# Patient Record
Sex: Male | Born: 1960 | Race: Black or African American | Hispanic: No | Marital: Married | Smoking: Never smoker
Health system: Southern US, Community
[De-identification: ages and names within clinical notes are randomized; demographics above are authoritative.]

## PROBLEM LIST (undated history)

## (undated) DIAGNOSIS — G4700 Insomnia, unspecified: Secondary | ICD-10-CM

## (undated) DIAGNOSIS — E7800 Pure hypercholesterolemia, unspecified: Secondary | ICD-10-CM

## (undated) DIAGNOSIS — M109 Gout, unspecified: Secondary | ICD-10-CM

## (undated) DIAGNOSIS — M1990 Unspecified osteoarthritis, unspecified site: Secondary | ICD-10-CM

## (undated) DIAGNOSIS — R7303 Prediabetes: Secondary | ICD-10-CM

## (undated) DIAGNOSIS — E559 Vitamin D deficiency, unspecified: Secondary | ICD-10-CM

## (undated) DIAGNOSIS — I10 Essential (primary) hypertension: Secondary | ICD-10-CM

## (undated) DIAGNOSIS — N529 Male erectile dysfunction, unspecified: Secondary | ICD-10-CM

## (undated) HISTORY — PX: COLONOSCOPY WITH ESOPHAGOGASTRODUODENOSCOPY (EGD): SHX5779

## (undated) HISTORY — PX: TONSILLECTOMY: SUR1361

## (undated) HISTORY — DX: Male erectile dysfunction, unspecified: N52.9

## (undated) HISTORY — DX: Prediabetes: R73.03

## (undated) HISTORY — DX: Insomnia, unspecified: G47.00

## (undated) HISTORY — DX: Vitamin D deficiency, unspecified: E55.9

## (undated) HISTORY — DX: Gout, unspecified: M10.9

---

## 2012-04-01 LAB — HM COLONOSCOPY

## 2014-09-25 ENCOUNTER — Emergency Department (HOSPITAL_COMMUNITY): Payer: Managed Care, Other (non HMO)

## 2014-09-25 ENCOUNTER — Observation Stay (HOSPITAL_COMMUNITY)
Admission: EM | Admit: 2014-09-25 | Discharge: 2014-09-26 | Disposition: A | Discharge status: Home / Self Care | Payer: Managed Care, Other (non HMO) | Attending: Urology | Admitting: Urology

## 2014-09-25 ENCOUNTER — Emergency Department (HOSPITAL_COMMUNITY): Payer: Managed Care, Other (non HMO) | Admitting: Anesthesiology

## 2014-09-25 ENCOUNTER — Encounter (HOSPITAL_COMMUNITY)
Admission: EM | Disposition: A | Discharge status: Home / Self Care | Payer: Self-pay | Source: Home / Self Care | Attending: Emergency Medicine

## 2014-09-25 ENCOUNTER — Encounter (HOSPITAL_COMMUNITY): Payer: Self-pay | Admitting: Nurse Practitioner

## 2014-09-25 DIAGNOSIS — N453 Epididymo-orchitis: Secondary | ICD-10-CM | POA: Diagnosis not present

## 2014-09-25 DIAGNOSIS — N433 Hydrocele, unspecified: Secondary | ICD-10-CM | POA: Insufficient documentation

## 2014-09-25 DIAGNOSIS — N492 Inflammatory disorders of scrotum: Secondary | ICD-10-CM | POA: Diagnosis present

## 2014-09-25 DIAGNOSIS — E780 Pure hypercholesterolemia: Secondary | ICD-10-CM | POA: Diagnosis not present

## 2014-09-25 DIAGNOSIS — I10 Essential (primary) hypertension: Secondary | ICD-10-CM | POA: Diagnosis not present

## 2014-09-25 DIAGNOSIS — Z880 Allergy status to penicillin: Secondary | ICD-10-CM | POA: Diagnosis not present

## 2014-09-25 DIAGNOSIS — N508 Other specified disorders of male genital organs: Secondary | ICD-10-CM | POA: Insufficient documentation

## 2014-09-25 DIAGNOSIS — R609 Edema, unspecified: Secondary | ICD-10-CM

## 2014-09-25 HISTORY — PX: IRRIGATION AND DEBRIDEMENT ABSCESS: SHX5252

## 2014-09-25 HISTORY — DX: Essential (primary) hypertension: I10

## 2014-09-25 HISTORY — PX: SCROTAL EXPLORATION: SHX2386

## 2014-09-25 HISTORY — DX: Pure hypercholesterolemia, unspecified: E78.00

## 2014-09-25 LAB — COMPREHENSIVE METABOLIC PANEL
ALBUMIN: 3.5 g/dL (ref 3.5–5.2)
ALK PHOS: 69 U/L (ref 39–117)
ALT: 19 U/L (ref 0–53)
AST: 23 U/L (ref 0–37)
Anion gap: 10 (ref 5–15)
BUN: 15 mg/dL (ref 6–23)
CALCIUM: 9.1 mg/dL (ref 8.4–10.5)
CHLORIDE: 104 mmol/L (ref 96–112)
CO2: 25 mmol/L (ref 19–32)
Creatinine, Ser: 1.51 mg/dL — ABNORMAL HIGH (ref 0.50–1.35)
GFR calc non Af Amer: 51 mL/min — ABNORMAL LOW (ref >90)
GFR, EST AFRICAN AMERICAN: 59 mL/min — AB (ref >90)
GLUCOSE: 130 mg/dL — AB (ref 70–99)
POTASSIUM: 4.1 mmol/L (ref 3.5–5.1)
SODIUM: 139 mmol/L (ref 135–145)
Total Bilirubin: 0.5 mg/dL (ref 0.3–1.2)
Total Protein: 7 g/dL (ref 6.0–8.3)

## 2014-09-25 LAB — URINALYSIS, ROUTINE W REFLEX MICROSCOPIC
Bilirubin Urine: NEGATIVE
Glucose, UA: NEGATIVE mg/dL
Hgb urine dipstick: NEGATIVE
Ketones, ur: NEGATIVE mg/dL
Leukocytes, UA: NEGATIVE
NITRITE: NEGATIVE
Protein, ur: NEGATIVE mg/dL
SPECIFIC GRAVITY, URINE: 1.025 (ref 1.005–1.030)
Urobilinogen, UA: 0.2 mg/dL (ref 0.0–1.0)
pH: 5.5 (ref 5.0–8.0)

## 2014-09-25 LAB — GRAM STAIN

## 2014-09-25 LAB — CBC WITH DIFFERENTIAL/PLATELET
BASOS ABS: 0 10*3/uL (ref 0.0–0.1)
BASOS PCT: 0 % (ref 0–1)
Eosinophils Absolute: 0 10*3/uL (ref 0.0–0.7)
Eosinophils Relative: 1 % (ref 0–5)
HCT: 43 % (ref 39.0–52.0)
HEMOGLOBIN: 14.7 g/dL (ref 13.0–17.0)
Lymphocytes Relative: 25 % (ref 12–46)
Lymphs Abs: 1.9 10*3/uL (ref 0.7–4.0)
MCH: 28.6 pg (ref 26.0–34.0)
MCHC: 34.2 g/dL (ref 30.0–36.0)
MCV: 83.7 fL (ref 78.0–100.0)
Monocytes Absolute: 0.9 10*3/uL (ref 0.1–1.0)
Monocytes Relative: 11 % (ref 3–12)
NEUTROS ABS: 4.7 10*3/uL (ref 1.7–7.7)
NEUTROS PCT: 63 % (ref 43–77)
Platelets: 270 10*3/uL (ref 150–400)
RBC: 5.14 MIL/uL (ref 4.22–5.81)
RDW: 13.8 % (ref 11.5–15.5)
WBC: 7.5 10*3/uL (ref 4.0–10.5)

## 2014-09-25 SURGERY — EXPLORATION, SCROTUM
Anesthesia: General | Site: Scrotum

## 2014-09-25 MED ORDER — BUPIVACAINE HCL (PF) 0.25 % IJ SOLN
INTRAMUSCULAR | Status: AC
  Filled 2014-09-25: qty 30

## 2014-09-25 MED ORDER — FENTANYL CITRATE 0.05 MG/ML IJ SOLN
INTRAMUSCULAR | Status: AC
  Filled 2014-09-25: qty 5

## 2014-09-25 MED ORDER — PROMETHAZINE HCL 25 MG/ML IJ SOLN: 6.2500 mg | INTRAMUSCULAR | Status: DC | PRN

## 2014-09-25 MED ORDER — HYDROMORPHONE HCL 1 MG/ML IJ SOLN
INTRAMUSCULAR | Status: AC
  Filled 2014-09-25: qty 1

## 2014-09-25 MED ORDER — FENTANYL CITRATE 0.05 MG/ML IJ SOLN
INTRAMUSCULAR | Status: DC | PRN
  Administered 2014-09-25: 75 ug via INTRAVENOUS
  Administered 2014-09-25: 50 ug via INTRAVENOUS
  Administered 2014-09-25: 75 ug via INTRAVENOUS
  Administered 2014-09-25: 50 ug via INTRAVENOUS

## 2014-09-25 MED ORDER — BUPIVACAINE HCL (PF) 0.25 % IJ SOLN
INTRAMUSCULAR | Status: DC | PRN
  Administered 2014-09-25: 9 mL

## 2014-09-25 MED ORDER — SUCCINYLCHOLINE CHLORIDE 20 MG/ML IJ SOLN
INTRAMUSCULAR | Status: DC | PRN
  Administered 2014-09-25: 100 mg via INTRAVENOUS

## 2014-09-25 MED ORDER — MIDAZOLAM HCL 5 MG/5ML IJ SOLN
INTRAMUSCULAR | Status: DC | PRN
  Administered 2014-09-25: 2 mg via INTRAVENOUS

## 2014-09-25 MED ORDER — SUCCINYLCHOLINE CHLORIDE 20 MG/ML IJ SOLN
INTRAMUSCULAR | Status: AC
Start: 2014-09-25 — End: 2014-09-25
  Filled 2014-09-25: qty 1

## 2014-09-25 MED ORDER — LISINOPRIL 20 MG PO TABS
20.0000 mg | ORAL_TABLET | Freq: Every day | ORAL | Status: DC
  Administered 2014-09-26: 20 mg via ORAL
  Filled 2014-09-25: qty 1

## 2014-09-25 MED ORDER — ONDANSETRON HCL 4 MG/2ML IJ SOLN
INTRAMUSCULAR | Status: AC
Start: 2014-09-25 — End: 2014-09-25
  Filled 2014-09-25: qty 6

## 2014-09-25 MED ORDER — OXYCODONE-ACETAMINOPHEN 5-325 MG PO TABS: 1.0000 | ORAL_TABLET | ORAL | Status: DC | PRN

## 2014-09-25 MED ORDER — HYDROCHLOROTHIAZIDE 12.5 MG PO CAPS
12.5000 mg | ORAL_CAPSULE | Freq: Every day | ORAL | Status: DC
  Administered 2014-09-26: 12.5 mg via ORAL
  Filled 2014-09-25: qty 1

## 2014-09-25 MED ORDER — ONDANSETRON HCL 4 MG/2ML IJ SOLN: 4.0000 mg | INTRAMUSCULAR | Status: DC | PRN

## 2014-09-25 MED ORDER — DOCUSATE SODIUM 100 MG PO CAPS
100.0000 mg | ORAL_CAPSULE | Freq: Two times a day (BID) | ORAL | Status: DC
  Administered 2014-09-26: 100 mg via ORAL
  Filled 2014-09-25 (×2): qty 1

## 2014-09-25 MED ORDER — MIDAZOLAM HCL 2 MG/2ML IJ SOLN
INTRAMUSCULAR | Status: AC
  Filled 2014-09-25: qty 2

## 2014-09-25 MED ORDER — AMLODIPINE BESYLATE 10 MG PO TABS
10.0000 mg | ORAL_TABLET | Freq: Every day | ORAL | Status: DC
  Administered 2014-09-26: 10 mg via ORAL
  Filled 2014-09-25: qty 1

## 2014-09-25 MED ORDER — DIPHENHYDRAMINE HCL 50 MG/ML IJ SOLN: 12.5000 mg | Freq: Four times a day (QID) | INTRAMUSCULAR | Status: DC | PRN

## 2014-09-25 MED ORDER — DOXYCYCLINE HYCLATE 50 MG PO CAPS: 100.0000 mg | ORAL_CAPSULE | Freq: Two times a day (BID) | ORAL | Status: DC

## 2014-09-25 MED ORDER — DIPHENHYDRAMINE HCL 50 MG/ML IJ SOLN
INTRAMUSCULAR | Status: DC | PRN
  Administered 2014-09-25: 12.5 mg via INTRAVENOUS

## 2014-09-25 MED ORDER — NEOSTIGMINE METHYLSULFATE 10 MG/10ML IV SOLN
INTRAVENOUS | Status: AC
  Filled 2014-09-25: qty 1

## 2014-09-25 MED ORDER — HYDROMORPHONE HCL 1 MG/ML IJ SOLN
0.2500 mg | INTRAMUSCULAR | Status: DC | PRN
  Administered 2014-09-25 (×2): 0.5 mg via INTRAVENOUS

## 2014-09-25 MED ORDER — SODIUM CHLORIDE 0.9 % IV SOLN: Freq: Once | INTRAVENOUS | Status: DC

## 2014-09-25 MED ORDER — LIDOCAINE HCL (CARDIAC) 20 MG/ML IV SOLN
INTRAVENOUS | Status: AC
Start: 2014-09-25 — End: 2014-09-25
  Filled 2014-09-25: qty 15

## 2014-09-25 MED ORDER — ONDANSETRON HCL 4 MG/2ML IJ SOLN
INTRAMUSCULAR | Status: DC | PRN
  Administered 2014-09-25: 4 mg via INTRAVENOUS

## 2014-09-25 MED ORDER — LISINOPRIL-HYDROCHLOROTHIAZIDE 20-12.5 MG PO TABS: 1.0000 | ORAL_TABLET | Freq: Every day | ORAL | Status: DC

## 2014-09-25 MED ORDER — SIMVASTATIN 10 MG PO TABS
10.0000 mg | ORAL_TABLET | Freq: Every day | ORAL | Status: DC
  Filled 2014-09-25: qty 1

## 2014-09-25 MED ORDER — DIPHENHYDRAMINE HCL 12.5 MG/5ML PO ELIX
12.5000 mg | ORAL_SOLUTION | Freq: Four times a day (QID) | ORAL | Status: DC | PRN
  Filled 2014-09-25: qty 5

## 2014-09-25 MED ORDER — OXYCODONE-ACETAMINOPHEN 5-325 MG PO TABS
1.0000 | ORAL_TABLET | ORAL | Status: DC | PRN
  Administered 2014-09-25 – 2014-09-26 (×3): 2 via ORAL
  Filled 2014-09-25 (×3): qty 2

## 2014-09-25 MED ORDER — LACTATED RINGERS IV SOLN
INTRAVENOUS | Status: DC | PRN
  Administered 2014-09-25: 20:00:00 via INTRAVENOUS

## 2014-09-25 MED ORDER — PHENYLEPHRINE 40 MCG/ML (10ML) SYRINGE FOR IV PUSH (FOR BLOOD PRESSURE SUPPORT)
PREFILLED_SYRINGE | INTRAVENOUS | Status: AC
  Filled 2014-09-25: qty 10

## 2014-09-25 MED ORDER — ZOLPIDEM TARTRATE 5 MG PO TABS: 5.0000 mg | ORAL_TABLET | Freq: Every evening | ORAL | Status: DC | PRN

## 2014-09-25 MED ORDER — SODIUM CHLORIDE 0.9 % IV SOLN
INTRAVENOUS | Status: DC
  Administered 2014-09-25: 22:00:00 via INTRAVENOUS

## 2014-09-25 MED ORDER — 0.9 % SODIUM CHLORIDE (POUR BTL) OPTIME
TOPICAL | Status: DC | PRN
  Administered 2014-09-25: 1000 mL

## 2014-09-25 MED ORDER — PROPOFOL 10 MG/ML IV BOLUS
INTRAVENOUS | Status: AC
  Filled 2014-09-25: qty 20

## 2014-09-25 MED ORDER — CEFTRIAXONE SODIUM IN DEXTROSE 20 MG/ML IV SOLN
1.0000 g | INTRAVENOUS | Status: DC
  Filled 2014-09-25: qty 50

## 2014-09-25 MED ORDER — DEXTROSE 5 % IV SOLN
1.0000 g | INTRAVENOUS | Status: AC
  Administered 2014-09-25: 1 g via INTRAVENOUS
  Filled 2014-09-25: qty 10

## 2014-09-25 MED ORDER — LIDOCAINE HCL (CARDIAC) 20 MG/ML IV SOLN
INTRAVENOUS | Status: DC | PRN
  Administered 2014-09-25: 100 mg via INTRAVENOUS

## 2014-09-25 MED ORDER — PROPOFOL 10 MG/ML IV BOLUS
INTRAVENOUS | Status: DC | PRN
  Administered 2014-09-25: 170 mg via INTRAVENOUS
  Administered 2014-09-25: 30 mg via INTRAVENOUS

## 2014-09-25 SURGICAL SUPPLY — 44 items
BAG DECANTER FOR FLEXI CONT (MISCELLANEOUS) ×3 IMPLANT
BLADE 10 SAFETY STRL DISP (BLADE) ×3 IMPLANT
BLADE SURG 15 STRL LF DISP TIS (BLADE) ×1 IMPLANT
BLADE SURG 15 STRL SS (BLADE) ×3
BNDG GAUZE ELAST 4 BULKY (GAUZE/BANDAGES/DRESSINGS) ×2 IMPLANT
COVER SURGICAL LIGHT HANDLE (MISCELLANEOUS) ×6 IMPLANT
DRAIN PENROSE 1/4X12 LTX STRL (WOUND CARE) ×2 IMPLANT
DRAPE LAPAROTOMY T 102X78X121 (DRAPES) IMPLANT
DRAPE PROXIMA HALF (DRAPES) IMPLANT
ELECT REM PT RETURN 9FT ADLT (ELECTROSURGICAL) ×3
ELECTRODE REM PT RTRN 9FT ADLT (ELECTROSURGICAL) ×1 IMPLANT
GAUZE SPONGE 4X4 16PLY XRAY LF (GAUZE/BANDAGES/DRESSINGS) ×3 IMPLANT
GLOVE BIO SURGEON STRL SZ7.5 (GLOVE) ×2 IMPLANT
GLOVE BIOGEL M STRL SZ7.5 (GLOVE) ×5 IMPLANT
GLOVE BIOGEL PI IND STRL 7.5 (GLOVE) IMPLANT
GLOVE BIOGEL PI IND STRL 8 (GLOVE) IMPLANT
GLOVE BIOGEL PI INDICATOR 7.5 (GLOVE) ×4
GLOVE BIOGEL PI INDICATOR 8 (GLOVE) ×2
GLOVE ECLIPSE 7.5 STRL STRAW (GLOVE) ×2 IMPLANT
GOWN STRL REUS W/ TWL LRG LVL3 (GOWN DISPOSABLE) ×2 IMPLANT
GOWN STRL REUS W/TWL LRG LVL3 (GOWN DISPOSABLE) ×6
HANDPIECE INTERPULSE COAX TIP (DISPOSABLE)
KIT BASIN OR (CUSTOM PROCEDURE TRAY) ×3 IMPLANT
KIT ROOM TURNOVER OR (KITS) ×3 IMPLANT
LEGGING LITHOTOMY PAIR STRL (DRAPES) IMPLANT
NDL HYPO 25X1 1.5 SAFETY (NEEDLE) IMPLANT
NEEDLE HYPO 25X1 1.5 SAFETY (NEEDLE) ×6 IMPLANT
NS IRRIG 1000ML POUR BTL (IV SOLUTION) ×3 IMPLANT
PACK SURGICAL SETUP 50X90 (CUSTOM PROCEDURE TRAY) ×3 IMPLANT
PAD ARMBOARD 7.5X6 YLW CONV (MISCELLANEOUS) ×6 IMPLANT
PENCIL BUTTON HOLSTER BLD 10FT (ELECTRODE) ×3 IMPLANT
SET HNDPC FAN SPRY TIP SCT (DISPOSABLE) IMPLANT
SPONGE LAP 4X18 X RAY DECT (DISPOSABLE) ×2 IMPLANT
SUT CHROMIC 3 0 SH 27 (SUTURE) ×4 IMPLANT
SUT CHROMIC 4 0 PS 2 18 (SUTURE) ×2 IMPLANT
SUT CHROMIC 4 0 RB 1X27 (SUTURE) IMPLANT
SUT ETHILON 2 0 PSLX (SUTURE) ×2 IMPLANT
SYR CONTROL 10ML LL (SYRINGE) ×3 IMPLANT
SYRINGE 10CC LL (SYRINGE) ×2 IMPLANT
TOWEL OR 17X24 6PK STRL BLUE (TOWEL DISPOSABLE) ×3 IMPLANT
TUBE CONNECTING 12'X1/4 (SUCTIONS) ×1
TUBE CONNECTING 12X1/4 (SUCTIONS) ×2 IMPLANT
WATER STERILE IRR 1000ML POUR (IV SOLUTION) ×3 IMPLANT
YANKAUER SUCT BULB TIP NO VENT (SUCTIONS) ×3 IMPLANT

## 2014-09-25 NOTE — ED Notes (Signed)
Mellody DrownLauren Parker PA at bedside to update family. Pt informed waiting for Urology to see pt.

## 2014-09-25 NOTE — ED Notes (Signed)
Pt c/o testicular swelling progressive over the past week. Pain 10/10 with standing or moving- but no pain with laying, non tender.

## 2014-09-25 NOTE — ED Provider Notes (Signed)
Pt is a 54 y.o. Male with 1 week history of testicular pain and swelling recently treated with Cipro and pain medication from PCP. Abnormal bedside US, awaiting formal scrotal US.  US shows IMPRESSION: 1. Prominent complex left hydrocele with internal septations. Somewhat expanded epididymal head with complex cysts internally, one containing an apparent fluid-fluid level, and measuring up to 2 cm in diameter. Differential diagnosis includes epididymitis with epididymal abscess and infected hydrocele, versus hemorrhagic hydrocele and hemorrhage into epididymal cysts. Correlate with fever/leukocytosis and risk factors. 2. No Doppler findings of testicular torsion. 3. Incidental small right spermatocele or epididymal cyst. Small simple appearing right hydrocele.  Urology paged.  Discussed with OR RN. 6:06 PM Discussed pt history and [place] results with Dr. Emelda Fear, urology, agrees to evaluate the patient in the ED. Discussed [place] results with the patient and need for evaluation by Urologist. Pt will undergo a I&D in the OR.  Results for orders placed or performed during the hospital encounter of 09/25/14  CBC with Differential  Result Value Ref Range   WBC 7.5 4.0 - 10.5 K/uL   RBC 5.14 4.22 - 5.81 MIL/uL   Hemoglobin 14.7 13.0 - 17.0 g/dL   HCT 16.1 09.6 - 04.5 %   MCV 83.7 78.0 - 100.0 fL   MCH 28.6 26.0 - 34.0 pg   MCHC 34.2 30.0 - 36.0 g/dL   RDW 40.9 81.1 - 91.4 %   Platelets 270 150 - 400 K/uL   Neutrophils Relative % 63 43 - 77 %   Neutro Abs 4.7 1.7 - 7.7 K/uL   Lymphocytes Relative 25 12 - 46 %   Lymphs Abs 1.9 0.7 - 4.0 K/uL   Monocytes Relative 11 3 - 12 %   Monocytes Absolute 0.9 0.1 - 1.0 K/uL   Eosinophils Relative 1 0 - 5 %   Eosinophils Absolute 0.0 0.0 - 0.7 K/uL   Basophils Relative 0 0 - 1 %   Basophils Absolute 0.0 0.0 - 0.1 K/uL  Comprehensive metabolic panel  Result Value Ref Range   Sodium 139 135 - 145 mmol/L   Potassium 4.1 3.5 - 5.1 mmol/L   Chloride 104 96 -  112 mmol/L   CO2 25 19 - 32 mmol/L   Glucose, Bld 130 (H) 70 - 99 mg/dL   BUN 15 6 - 23 mg/dL   Creatinine, Ser 7.82 (H) 0.50 - 1.35 mg/dL   Calcium 9.1 8.4 - 95.6 mg/dL   Total Protein 7.0 6.0 - 8.3 g/dL   Albumin 3.5 3.5 - 5.2 g/dL   AST 23 0 - 37 U/L   ALT 19 0 - 53 U/L   Alkaline Phosphatase 69 39 - 117 U/L   Total Bilirubin 0.5 0.3 - 1.2 mg/dL   GFR calc non Af Amer 51 (L) >90 mL/min   GFR calc Af Amer 59 (L) >90 mL/min   Anion gap 10 5 - 15  Urinalysis, Routine w reflex microscopic  Result Value Ref Range   Color, Urine YELLOW YELLOW   APPearance CLEAR CLEAR   Specific Gravity, Urine 1.025 1.005 - 1.030   pH 5.5 5.0 - 8.0   Glucose, UA NEGATIVE NEGATIVE mg/dL   Hgb urine dipstick NEGATIVE NEGATIVE   Bilirubin Urine NEGATIVE NEGATIVE   Ketones, ur NEGATIVE NEGATIVE mg/dL   Protein, ur NEGATIVE NEGATIVE mg/dL   Urobilinogen, UA 0.2 0.0 - 1.0 mg/dL   Nitrite NEGATIVE NEGATIVE   Leukocytes, UA NEGATIVE NEGATIVE   US Scrotum  09/25/2014  CLINICAL DATA:  Testicular pain and swelling over the past week.  EXAM: SCROTAL ULTRASOUND  DOPPLER ULTRASOUND OF THE TESTICLES  TECHNIQUE: Complete ultrasound examination of the testicles, epididymis, and other scrotal structures was performed. Color and spectral Doppler ultrasound were also utilized to evaluate blood flow to the testicles.  COMPARISON:  None.  FINDINGS: Right testicle  Measurements: 4.3 by 2.6 by 3.0 cm. No mass or microlithiasis visualized.  Left testicle  Measurements: 3.1 by 2.9 by 3.0 cm. No mass or microlithiasis visualized.  Right epididymis:  Unilocular cyst or spermatocele, 0.4 cm diameter.  Left epididymis: Complex septated cystic lesion in expanded epididymis, complex internal fluid with debris/fluid- fluid level. Epididymal size 4.1 by 3.1 cm. Mild marginal hyperemia.  Hydrocele:  Large complex septated hydrocele on the left  Varicocele:  None visualized.  Pulsed Doppler interrogation of both testes demonstrates low  resistance arterial and venous waveforms bilaterally.  Scrotum:  Skin thickening.  Hyperemia.  IMPRESSION: 1. Prominent complex left hydrocele with internal septations. Somewhat expanded epididymal head with complex cysts internally, one containing an apparent fluid-fluid level, and measuring up to 2 cm in diameter. Differential diagnosis includes epididymitis with epididymal abscess and infected hydrocele, versus hemorrhagic hydrocele and hemorrhage into epididymal cysts. Correlate with fever/leukocytosis and risk factors. 2. No Doppler findings of testicular torsion. 3. Incidental small right spermatocele or epididymal cyst. Small simple appearing right hydrocele.   Electronically Signed   By: Herbie BaltimoreWalt  Liebkemann M.D.   On: 09/25/2014 16:44   [place] Art/ven Flow Abd Pelv Doppler  09/25/2014   CLINICAL DATA:  Testicular pain and swelling over the past week.  EXAM: SCROTAL ULTRASOUND  DOPPLER ULTRASOUND OF THE TESTICLES  TECHNIQUE: Complete ultrasound examination of the testicles, epididymis, and other scrotal structures was performed. Color and spectral Doppler ultrasound were also utilized to evaluate blood flow to the testicles.  COMPARISON:  None.  FINDINGS: Right testicle  Measurements: 4.3 by 2.6 by 3.0 cm. No mass or microlithiasis visualized.  Left testicle  Measurements: 3.1 by 2.9 by 3.0 cm. No mass or microlithiasis visualized.  Right epididymis:  Unilocular cyst or spermatocele, 0.4 cm diameter.  Left epididymis: Complex septated cystic lesion in expanded epididymis, complex internal fluid with debris/fluid- fluid level. Epididymal size 4.1 by 3.1 cm. Mild marginal hyperemia.  Hydrocele:  Large complex septated hydrocele on the left  Varicocele:  None visualized.  Pulsed Doppler interrogation of both testes demonstrates low resistance arterial and venous waveforms bilaterally.  Scrotum:  Skin thickening.  Hyperemia.  IMPRESSION: 1. Prominent complex left hydrocele with internal septations. Somewhat expanded  epididymal head with complex cysts internally, one containing an apparent fluid-fluid level, and measuring up to 2 cm in diameter. Differential diagnosis includes epididymitis with epididymal abscess and infected hydrocele, versus hemorrhagic hydrocele and hemorrhage into epididymal cysts. Correlate with fever/leukocytosis and risk factors. 2. No Doppler findings of testicular torsion. 3. Incidental small right spermatocele or epididymal cyst. Small simple appearing right hydrocele.   Electronically Signed   By: Herbie BaltimoreWalt  Liebkemann M.D.   On: 09/25/2014 16:44      Mellody DrownLauren Thomasena Vandenheuvel, PA-C 09/25/14 1947

## 2014-09-25 NOTE — Op Note (Signed)
Preoperative Diagnosis: Scrotal abscess  Postoperative Diagnosis:  same  Procedure(s) Performed: Scrotal exploration  Teaching Surgeon:  Heloise PurpuraLester Danese Dorsainvil, MD  Resident Surgeon:  Dyke BrackettJames "Jed" Ferguson, MD  Assistant(s):  none  Anesthesia:  General via LMA  Fluids:  See anesthesia record  UOP: unable to obtain  Estimated blood loss:  5 mL  Specimens:  Abscess fluid for culture and gram stain  Drains:  0.25" penrose in left hemiscrotum.  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 After discussion of risks/benefits/alternative therapies, patient elected for the above procedure.  Findings:   1. Multiloculated hydrocele with thin straw colored fluid. 2. Some fibrinous debris. 3. Inflammed epidydimis with fluid filled cyst with opaque straw colored fluid.  Description:  The patient was correctly identified in the preop holding area where written informed consent as well potential risk and complication reviewed. He agreed. They were brought back to the operative suite where a preinduction timeout was performed. Once correct information was verified, general anesthesia was induced via endotracheal tube. SCDs were placed for VTE prophylaxis. The following prophylactic perioperative antibiotics were instilled: ancef. A second timeout was then performed.   A 3cm incision was made in the midline/scrotal raphe and dartos was developed with electrocautery down to the tunica vaginalis, which was opened with electrocautery. Loculations were broken down with finger fracture. Cultures were sent. An epidydimal cyst about 2cm was aspirated with 25g needle. Findings as above.  A separate stab incision was made in the inferior scrotum, through which a penrose drain was placed.  Dartos was closed with 3-0 chromic in running fashion. Skin was closed with 4-0 chromic in interrupted  horizontal mattress fashion.  0.25% marcaine was instilled.  The patient was awoken from anesthesia having tolerated the procedure well.   Post Op Plan:   1. Discharge home in AM after IV abx. 2. Continue doxycycline. F/u cultures. Tailor as appropriate.  Attestation:  Dr. Laverle PatterBorden was present and scrubbed for the entirety of the procedure.    James "Jed" Emelda FearFerguson, MD Resident, Department of Urology

## 2014-09-25 NOTE — Anesthesia Preprocedure Evaluation (Addendum)
Anesthesia Evaluation  Patient identified by MRN, date of birth, ID band Patient awake    Reviewed: Allergy & Precautions, NPO status , Patient's Chart, lab work & pertinent test results  History of Anesthesia Complications Negative for: history of anesthetic complications  Airway Mallampati: I   Neck ROM: Full    Dental  (+) Teeth Intact, Partial Upper   Pulmonary neg pulmonary ROS,  breath sounds clear to auscultation        Cardiovascular hypertension, Rhythm:Regular Rate:Normal     Neuro/Psych negative neurological ROS     GI/Hepatic negative GI ROS, Neg liver ROS,   Endo/Other    Renal/GU negative Renal ROS     Musculoskeletal   Abdominal   Peds  Hematology   Anesthesia Other Findings   Reproductive/Obstetrics                            Anesthesia Physical Anesthesia Plan  ASA: II and emergent  Anesthesia Plan: General   Post-op Pain Management:    Induction: Intravenous  Airway Management Planned: Oral ETT  Additional Equipment:   Intra-op Plan:   Post-operative Plan: Extubation in OR  Informed Consent: I have reviewed the patients History and Physical, chart, labs and discussed the procedure including the risks, benefits and alternatives for the proposed anesthesia with the patient or authorized representative who has indicated his/her understanding and acceptance.   Dental advisory given  Plan Discussed with: CRNA and Surgeon  Anesthesia Plan Comments:         Anesthesia Quick Evaluation

## 2014-09-25 NOTE — Anesthesia Procedure Notes (Signed)
Procedure Name: Intubation Date/Time: 09/25/2014 8:01 PM Performed by: Arlice ColtMANESS, Ahsley Attwood B Pre-anesthesia Checklist: Patient identified, Emergency Drugs available, Suction available, Patient being monitored and Timeout performed Patient Re-evaluated:Patient Re-evaluated prior to inductionOxygen Delivery Method: Circle system utilized Preoxygenation: Pre-oxygenation with 100% oxygen Intubation Type: IV induction and Rapid sequence Laryngoscope Size: Mac and 3 Grade View: Grade I Tube type: Oral Tube size: 7.5 mm Number of attempts: 1 Airway Equipment and Method: Stylet Placement Confirmation: ETT inserted through vocal cords under direct vision,  positive ETCO2 and breath sounds checked- equal and bilateral Secured at: 23 cm Tube secured with: Tape Dental Injury: Teeth and Oropharynx as per pre-operative assessment

## 2014-09-25 NOTE — Consult Note (Signed)
Urology Consult   Physician requesting consult: Dr. Jodi Mourning, [facility], [facility].  Reason for consult: Left scrotal abscess  History of Present Illness: Raymond Willis is a 54 y.o. with HLD, HTN, and 5 days of left testicular pain. He has never experienced anything like this before. He denies any trauma. Out of the blue on Sunday, he started having left throbbing orchalgia, resistant to ibuprofen. On Monday, it brought him to his knees, and he was evaluated by his PCP and placed on CIPRO. He gave a urine sample, but is unsure of the results of that. He denies fevers, dysuria, LUTS, gross hematuria. He denies STDs or urethral discharge. He denies any high risk sexual behavior.  On Thursday, when his symptoms were worsening, he went again to his PCP and was prescribed doxycycline, but had not yet taken this. Once the pain and swelling worsened even more, he came to the ED.  He denies a history of voiding or storage urinary symptoms, hematuria, UTIs, STDs, urolithiasis, GU malignancy/trauma/surgery.  Past Medical History  Diagnosis Date  . Hypertension   . Hypercholesteremia     History reviewed. No pertinent past surgical history.  Current Hospital Medications:  Home Meds:    Medication List    ASK your doctor about these medications        amLODipine 10 MG tablet  Commonly known as:  NORVASC  Take 10 mg by mouth daily.     ciprofloxacin 500 MG tablet  Commonly known as:  CIPRO  Take 500 mg by mouth 2 (two) times daily.     doxycycline 100 MG tablet  Commonly known as:  VIBRA-TABS  Take 100 mg by mouth 2 (two) times daily.     lisinopril-hydrochlorothiazide 20-12.5 MG per tablet  Commonly known as:  PRINZIDE,ZESTORETIC  Take 1 tablet by mouth daily.     oxyCODONE-acetaminophen 5-325 MG per tablet  Commonly known as:  PERCOCET/ROXICET  Take 1 tablet by mouth 2 (two) times daily.     simvastatin 10 MG tablet  Commonly known as:  ZOCOR  Take 10 mg by mouth daily at 6 PM.      VOLTAREN 1 % Gel  Generic drug:  diclofenac sodium  Apply 2 g topically daily.        Scheduled Meds:  Continuous Infusions: . sodium chloride Stopped (09/25/14 1306)   PRN Meds:.  Allergies:  Allergies  Allergen Reactions  . Penicillins Hives    No family history on file.  Social History:  reports that he has never smoked. He does not have any smokeless tobacco history on file. He reports that he does not drink alcohol or use illicit drugs.  ROS: A complete review of systems was performed.  All systems are negative except for pertinent findings as noted.  Physical Exam:  Vital signs in last 24 hours: Temp:  [99 F (37.2 C)] 99 F (37.2 C) (01/22 1233) Pulse Rate:  [75-98] 86 (01/22 1830) Resp:  [16-20] 18 (01/22 1800) BP: (106-152)/(66-97) 129/87 mmHg (01/22 1830) SpO2:  [96 %-100 %] 96 % (01/22 1830) Constitutional:  Alert and oriented, No acute distress Cardiovascular: Regular rate and rhythm, No JVD Respiratory: Normal respiratory effort, Lungs clear bilaterally GI: Abdomen is soft, nontender, nondistended, no abdominal masses GU: No CVA tenderness. Large left hemiscrotum, markedly enlarged, tender to palpation, indurated but not terribly fluctuant. Normal right testicle and hemiscrotum. Lymphatic: No lymphadenopathy Neurologic: Grossly intact, no focal deficits Psychiatric: Normal mood and affect  Laboratory Data:   Recent Labs  09/25/14 1254  WBC 7.5  HGB 14.7  HCT 43.0  PLT 270     Recent Labs  09/25/14 1254  NA 139  K 4.1  CL 104  GLUCOSE 130*  BUN 15  CALCIUM 9.1  CREATININE 1.51*     Results for orders placed or performed during the hospital encounter of 09/25/14 (from the past 24 hour(s))  CBC with Differential     Status: None   Collection Time: 09/25/14 12:54 PM  Result Value Ref Range   WBC 7.5 4.0 - 10.5 K/uL   RBC 5.14 4.22 - 5.81 MIL/uL   Hemoglobin 14.7 13.0 - 17.0 g/dL   HCT 16.1 09.6 - 04.5 %   MCV 83.7 78.0 - 100.0 fL    MCH 28.6 26.0 - 34.0 pg   MCHC 34.2 30.0 - 36.0 g/dL   RDW 40.9 81.1 - 91.4 %   Platelets 270 150 - 400 K/uL   Neutrophils Relative % 63 43 - 77 %   Neutro Abs 4.7 1.7 - 7.7 K/uL   Lymphocytes Relative 25 12 - 46 %   Lymphs Abs 1.9 0.7 - 4.0 K/uL   Monocytes Relative 11 3 - 12 %   Monocytes Absolute 0.9 0.1 - 1.0 K/uL   Eosinophils Relative 1 0 - 5 %   Eosinophils Absolute 0.0 0.0 - 0.7 K/uL   Basophils Relative 0 0 - 1 %   Basophils Absolute 0.0 0.0 - 0.1 K/uL  Comprehensive metabolic panel     Status: Abnormal   Collection Time: 09/25/14 12:54 PM  Result Value Ref Range   Sodium 139 135 - 145 mmol/L   Potassium 4.1 3.5 - 5.1 mmol/L   Chloride 104 96 - 112 mmol/L   CO2 25 19 - 32 mmol/L   Glucose, Bld 130 (H) 70 - 99 mg/dL   BUN 15 6 - 23 mg/dL   Creatinine, Ser 7.82 (H) 0.50 - 1.35 mg/dL   Calcium 9.1 8.4 - 95.6 mg/dL   Total Protein 7.0 6.0 - 8.3 g/dL   Albumin 3.5 3.5 - 5.2 g/dL   AST 23 0 - 37 U/L   ALT 19 0 - 53 U/L   Alkaline Phosphatase 69 39 - 117 U/L   Total Bilirubin 0.5 0.3 - 1.2 mg/dL   GFR calc non Af Amer 51 (L) >90 mL/min   GFR calc Af Amer 59 (L) >90 mL/min   Anion gap 10 5 - 15  Urinalysis, Routine w reflex microscopic     Status: None   Collection Time: 09/25/14  1:00 PM  Result Value Ref Range   Color, Urine YELLOW YELLOW   APPearance CLEAR CLEAR   Specific Gravity, Urine 1.025 1.005 - 1.030   pH 5.5 5.0 - 8.0   Glucose, UA NEGATIVE NEGATIVE mg/dL   Hgb urine dipstick NEGATIVE NEGATIVE   Bilirubin Urine NEGATIVE NEGATIVE   Ketones, ur NEGATIVE NEGATIVE mg/dL   Protein, ur NEGATIVE NEGATIVE mg/dL   Urobilinogen, UA 0.2 0.0 - 1.0 mg/dL   Nitrite NEGATIVE NEGATIVE   Leukocytes, UA NEGATIVE NEGATIVE   No results found for this or any previous visit (from the past 240 hour(s)).  Renal Function:  Recent Labs  09/25/14 1254  CREATININE 1.51*   CrCl cannot be calculated (Unknown ideal weight.).  Radiologic Imaging: US Scrotum  09/25/2014    CLINICAL DATA:  Testicular pain and swelling over the past week.  EXAM: SCROTAL ULTRASOUND  DOPPLER ULTRASOUND OF THE TESTICLES  TECHNIQUE: Complete ultrasound examination  of the testicles, epididymis, and other scrotal structures was performed. Color and spectral Doppler ultrasound were also utilized to evaluate blood flow to the testicles.  COMPARISON:  None.  FINDINGS: Right testicle  Measurements: 4.3 by 2.6 by 3.0 cm. No mass or microlithiasis visualized.  Left testicle  Measurements: 3.1 by 2.9 by 3.0 cm. No mass or microlithiasis visualized.  Right epididymis:  Unilocular cyst or spermatocele, 0.4 cm diameter.  Left epididymis: Complex septated cystic lesion in expanded epididymis, complex internal fluid with debris/fluid- fluid level. Epididymal size 4.1 by 3.1 cm. Mild marginal hyperemia.  Hydrocele:  Large complex septated hydrocele on the left  Varicocele:  None visualized.  Pulsed Doppler interrogation of both testes demonstrates low resistance arterial and venous waveforms bilaterally.  Scrotum:  Skin thickening.  Hyperemia.  IMPRESSION: 1. Prominent complex left hydrocele with internal septations. Somewhat expanded epididymal head with complex cysts internally, one containing an apparent fluid-fluid level, and measuring up to 2 cm in diameter. Differential diagnosis includes epididymitis with epididymal abscess and infected hydrocele, versus hemorrhagic hydrocele and hemorrhage into epididymal cysts. Correlate with fever/leukocytosis and risk factors. 2. No Doppler findings of testicular torsion. 3. Incidental small right spermatocele or epididymal cyst. Small simple appearing right hydrocele.   Electronically Signed   By: Herbie Baltimore M.D.   On: 09/25/2014 16:44   [place] Art/ven Flow Abd Pelv Doppler  09/25/2014   CLINICAL DATA:  Testicular pain and swelling over the past week.  EXAM: SCROTAL ULTRASOUND  DOPPLER ULTRASOUND OF THE TESTICLES  TECHNIQUE: Complete ultrasound examination of the  testicles, epididymis, and other scrotal structures was performed. Color and spectral Doppler ultrasound were also utilized to evaluate blood flow to the testicles.  COMPARISON:  None.  FINDINGS: Right testicle  Measurements: 4.3 by 2.6 by 3.0 cm. No mass or microlithiasis visualized.  Left testicle  Measurements: 3.1 by 2.9 by 3.0 cm. No mass or microlithiasis visualized.  Right epididymis:  Unilocular cyst or spermatocele, 0.4 cm diameter.  Left epididymis: Complex septated cystic lesion in expanded epididymis, complex internal fluid with debris/fluid- fluid level. Epididymal size 4.1 by 3.1 cm. Mild marginal hyperemia.  Hydrocele:  Large complex septated hydrocele on the left  Varicocele:  None visualized.  Pulsed Doppler interrogation of both testes demonstrates low resistance arterial and venous waveforms bilaterally.  Scrotum:  Skin thickening.  Hyperemia.  IMPRESSION: 1. Prominent complex left hydrocele with internal septations. Somewhat expanded epididymal head with complex cysts internally, one containing an apparent fluid-fluid level, and measuring up to 2 cm in diameter. Differential diagnosis includes epididymitis with epididymal abscess and infected hydrocele, versus hemorrhagic hydrocele and hemorrhage into epididymal cysts. Correlate with fever/leukocytosis and risk factors. 2. No Doppler findings of testicular torsion. 3. Incidental small right spermatocele or epididymal cyst. Small simple appearing right hydrocele.   Electronically Signed   By: Herbie Baltimore M.D.   On: 09/25/2014 16:44    I independently reviewed the above imaging studies.  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 Will take for scrotal exploration. Discussed risks and benefits including possible orchiectomy (either from disease process or post surgical infection). Discussed fertility issues with epidydimal involvement. He is "done having kids", and not worried about this.  Discussed worsening infection, bleeding, need for a drain, and careful follow up. I'm worried that the natural history of this infection is far riskier than the surgery, and so my strong recommendation was for the above procedure. On top of this, we can obtain an organism and direct antimicrobial treatment.  He agrees and has consented for scrotal exploration.  If he can secure a ride (given the terrible weather outside), we will discharge from PACU. If not, he may need observation.  Discussed with Dr. Gordy LevanBorden  Ferguson, Guy SandiferJames E 09/25/2014, 7:04 PM   I have seen and examined the patient.  I agree with Dr. Rayna SextonFerguson's assessment and plan.  Will proceed to the OR for scrotal exploration and drainage of probable pyocele likely due to epididymo-orchitis.  Moody BruinsLester S. Dimitra Woodstock, Jr. MD

## 2014-09-25 NOTE — Transfer of Care (Signed)
Immediate Anesthesia Transfer of Care Note  Patient: Raymond Willis  Procedure(s) Performed: Procedure(s): SCROTUM EXPLORATION (N/A) IRRIGATION AND DEBRIDEMENT ABSCESS (N/A)  Patient Location: PACU  Anesthesia Type:General  Level of Consciousness: awake, alert  and oriented  Airway & Oxygen Therapy: Patient Spontanous Breathing and Patient connected to face mask oxygen  Post-op Assessment: Report given to PACU RN and Post -op Vital signs reviewed and stable  Post vital signs: Reviewed and stable  Complications: No apparent anesthesia complications

## 2014-09-25 NOTE — Discharge Instructions (Signed)
Keep scrotal dressing in place until you return for drain removal.  You may change the dressing as needed if it becomes wet or smells badly.   Call if you develop fever > 101 or pain that is uncontrolled.

## 2014-09-25 NOTE — ED Provider Notes (Signed)
CSN: [id number]     Arrival date & time 09/25/14  1219 History   None    No chief complaint on file.    (Consider location/radiation/quality/duration/timing/severity/associated sxs/prior Treatment) Patient is a 54 y.o. male presenting with testicular pain. The history is provided by the patient. No language interpreter was used.  Testicle Pain This is a new problem. The current episode started in the past 7 days. The problem occurs constantly. The problem has been gradually worsening. Pertinent negatives include no urinary symptoms or vomiting. Nothing aggravates the symptoms. He has tried nothing for the symptoms. The treatment provided no relief.  Pt reports he noticed swelling to his scrotum 1 week ago.  Pt saw Dr. Marcy SalvoAvbure on Tuesday and fiven rx for cipro and pain medication.  Pt reports he is having increased swelling and worse pain with standing.  Pt reports Dr. Concepcion ElkAvbuere called in a second antibiotic he has not started yet.  Pt denies any previous swelling.  No uti symptoms,  no std risk.  No diabetes.  Pt does not do self testicular exams. No history of hernias.  No abdominal pain.   No past medical history on file. No past surgical history on file. No family history on file. History  Substance Use Topics  . Smoking status: Not on file  . Smokeless tobacco: Not on file  . Alcohol Use: Not on file    Review of Systems  Gastrointestinal: Negative for vomiting.  Genitourinary: Positive for testicular pain.  All other systems reviewed and are negative.     Allergies  Review of patient's allergies indicates not on file.  Home Medications   Prior to Admission medications   Not on File   There were no vitals taken for this visit. Physical Exam  Constitutional: He is oriented to person, place, and time. He appears well-developed and well-nourished.  HENT:  Head: Normocephalic and atraumatic.  Mouth/Throat: Oropharynx is clear and moist.  Eyes: EOM are normal. Pupils are  equal, round, and reactive to light.  Neck: Normal range of motion.  Cardiovascular: Normal heart sounds.   Pulmonary/Chest: Effort normal.  Abdominal: Soft. He exhibits no distension.  Genitourinary: No penile tenderness.  Firn enlarged scrotum I am unable to palpate testicles,  No evidence of hernia  Musculoskeletal: Normal range of motion.  Neurological: He is alert and oriented to person, place, and time.  Psychiatric: He has a normal mood and affect.  Nursing note and vitals reviewed.   ED Course  Procedures (including critical care time) Labs Review Labs Reviewed  COMPREHENSIVE METABOLIC PANEL - Abnormal; Notable for the following:    Glucose, Bld 130 (*)    Creatinine, Ser 1.51 (*)    GFR calc non Af Amer 51 (*)    GFR calc Af Amer 59 (*)    All other components within normal limits  CBC WITH DIFFERENTIAL  URINALYSIS, ROUTINE W REFLEX MICROSCOPIC    Imaging Review No results found.   EKG Interpretation None      MDM   Dr. Jodi Mourningzavitz in to see and examine.     Final diagnoses:  Swelling  Scrotal abscess        Raymond AreasLeslie K Venetta Willis, Raymond Willis 09/27/14 1211  Raymond Willis, Raymond Willis 09/27/14 1534

## 2014-09-25 NOTE — ED Notes (Signed)
Dr Zavitz at bedside  

## 2014-09-25 NOTE — Anesthesia Postprocedure Evaluation (Signed)
  Anesthesia Post-op Note  Patient: Raymond Willis  Procedure(s) Performed: Procedure(s): SCROTUM EXPLORATION (N/A) IRRIGATION AND DEBRIDEMENT ABSCESS (N/A)  Patient Location: PACU  Anesthesia Type:General  Level of Consciousness: awake and alert   Airway and Oxygen Therapy: Patient Spontanous Breathing  Post-op Pain: mild  Post-op Assessment: Post-op Vital signs reviewed  Post-op Vital Signs: stable  Last Vitals:  Filed Vitals:   09/25/14 2211  BP: 132/78  Pulse: 97  Temp: 37 C  Resp:     Complications: No apparent anesthesia complications

## 2014-09-25 NOTE — ED Notes (Signed)
MD Borden at bedside

## 2014-09-26 NOTE — Progress Notes (Signed)
UR completed 

## 2014-09-26 NOTE — Discharge Summary (Signed)
  Date of admission: 09/25/2014  Date of discharge: 09/26/2014  Admission diagnosis: Epididymoorchitis, possible pyocele  Discharge diagnosis: Epididymoorchitis, infected hydrocele  History and Physical: For full details, please see admission history and physical. Briefly, Raymond Willis is a 54 y.o. year old patient with epididymoorchitis. Ultrasound evaluation revealed findings consisted with a possible pyocele.  He was not responding to empiric antibiotics and was recommended to undergo surgical drainage.   Hospital Course: He was taken to the OR and underwent scrotal exploration. He was noted to have a very inflammed and enlarged left epididymis with clear hydrocele fluid that was likely reactive and possibly infected.  This fluid was sent for culture.  He also had fluid collections of the epididymis.  These were either infectious or possibly spermatocele.  These were aspirated and also sent for culture. A drain was left in place.  He was admitted overnight on IV antibiotics and remained stable and was ok for discharge on POD#1.  Laboratory values:  Recent Labs  09/25/14 1254  HGB 14.7  HCT 43.0    Recent Labs  09/25/14 1254  CREATININE 1.51*    Disposition: Home  Discharge instruction: The patient was instructed to be ambulatory but told to refrain from heavy lifting, strenuous activity, or driving.  He will continue a fluff dressing.  Discharge medications:    Medication List    STOP taking these medications        doxycycline 100 MG tablet  Commonly known as:  VIBRA-TABS  Replaced by:  doxycycline 50 MG capsule      TAKE these medications        amLODipine 10 MG tablet  Commonly known as:  NORVASC  Take 10 mg by mouth daily.     ciprofloxacin 500 MG tablet  Commonly known as:  CIPRO  Take 500 mg by mouth 2 (two) times daily.     doxycycline 50 MG capsule  Commonly known as:  VIBRAMYCIN  Take 2 capsules (100 mg total) by mouth every 12 (twelve) hours.     lisinopril-hydrochlorothiazide 20-12.5 MG per tablet  Commonly known as:  PRINZIDE,ZESTORETIC  Take 1 tablet by mouth daily.     oxyCODONE-acetaminophen 5-325 MG per tablet  Commonly known as:  PERCOCET/ROXICET  Take 1 tablet by mouth 2 (two) times daily.     oxyCODONE-acetaminophen 5-325 MG per tablet  Commonly known as:  ROXICET  Take 1-2 tablets by mouth every 4 (four) hours as needed for severe pain.     simvastatin 10 MG tablet  Commonly known as:  ZOCOR  Take 10 mg by mouth daily at 6 PM.     VOLTAREN 1 % Gel  Generic drug:  diclofenac sodium  Apply 2 g topically daily.        Followup:      Follow-up Information    Follow up with Crecencio McBORDEN,LES, MD.   Specialty:  Urology   Why:  Will call to schedule for drain removal.   Contact information:   [address] [place] [id number] [phone]

## 2014-09-26 NOTE — Progress Notes (Addendum)
Patient ID: Raymond Willis, male   DOB: 07/19/1961, 54 y.o.   MRN: [id number]  1 Day Post-Op Subjective: Pt s/p drainage of hydrocele related to infection/epididymoorchitis.  Pain is improved and controlled.  Objective: Vital signs in last 24 hours: Temp:  [97.8 F (36.6 C)-99.8 F (37.7 C)] 99 F (37.2 C) (01/23 0550) Pulse Rate:  [75-104] 88 (01/23 0550) Resp:  [12-22] 16 (01/23 0550) BP: (106-177)/(66-102) 112/77 mmHg (01/23 0550) SpO2:  [92 %-100 %] 98 % (01/23 0550) Weight:  [98.601 kg (217 lb 6 oz)] 98.601 kg (217 lb 6 oz) (01/22 2218)  Intake/Output from previous day: 01/22 0701 - 01/23 0700 In: 800 [I.V.:800] Out: -  Intake/Output this shift: Total I/O In: 800 [I.V.:800] Out: -   Physical Exam:  General: Alert and oriented GU: Penrose drain with mild serosanguineous drainage.  Scrotum still swollen but with less erythema.  Incision intact.  Lab Results:  Recent Labs  09/25/14 1254  HGB 14.7  HCT 43.0   BMET  Recent Labs  09/25/14 1254  NA 139  K 4.1  CL 104  CO2 25  GLUCOSE 130*  BUN 15  CREATININE 1.51*  CALCIUM 9.1     Studies/Results: GS - negative for obvious organisms Cultures pending.  Assessment/Plan: - D/C home. Will treat with 2 weeks of doxycycline. - F/U next week for drain removal    LOS: 1 day   Katera Rybka,LES 09/26/2014, 6:56 AM

## 2014-09-26 NOTE — Progress Notes (Signed)
Patient was discharged home by MD order; discharged instructions  review and give to patient with care notes and prescriptions; IV DIC; skin intact; surgical place redressed, clean, no drainage;  patient will be escorted to the car by nurse tech via wheelchair.

## 2014-09-27 LAB — HIV ANTIBODY (ROUTINE TESTING W REFLEX)
HIV 1/O/2 Abs-Index Value: <1 (ref <1.00)
HIV-1/HIV-2 Ab: NONREACTIVE

## 2014-09-27 LAB — RPR: RPR: NONREACTIVE

## 2014-09-28 ENCOUNTER — Encounter (HOSPITAL_COMMUNITY): Payer: Self-pay | Admitting: Urology

## 2014-09-29 LAB — CULTURE, ROUTINE-ABSCESS
CULTURE: NO GROWTH
Culture: NO GROWTH

## 2014-09-30 LAB — ANAEROBIC CULTURE

## 2015-09-03 ENCOUNTER — Telehealth: Payer: Self-pay | Admitting: General Practice

## 2015-09-03 NOTE — Telephone Encounter (Signed)
Would like to know if you would take patient on

## 2015-09-04 NOTE — Telephone Encounter (Signed)
Unfortunately, I'm not able to accept any more new patients at this time.  I'm sorry! Thank you!  

## 2015-09-08 NOTE — Telephone Encounter (Signed)
Left vm with MD response.  Also notified that we have Marcos EkeGreg Calone, Cheryll CockayneStacy Burns and Hillard DankerElizabeth Crawford as options.

## 2015-12-30 ENCOUNTER — Ambulatory Visit (INDEPENDENT_AMBULATORY_CARE_PROVIDER_SITE_OTHER): Payer: Managed Care, Other (non HMO) | Admitting: Primary Care

## 2015-12-30 ENCOUNTER — Encounter: Payer: Self-pay | Admitting: Primary Care

## 2015-12-30 VITALS — BP 124/90 | HR 71 | Temp 97.5°F | Ht 67.5 in | Wt 214.4 lb

## 2015-12-30 DIAGNOSIS — I10 Essential (primary) hypertension: Secondary | ICD-10-CM | POA: Insufficient documentation

## 2015-12-30 DIAGNOSIS — N529 Male erectile dysfunction, unspecified: Secondary | ICD-10-CM

## 2015-12-30 DIAGNOSIS — E785 Hyperlipidemia, unspecified: Secondary | ICD-10-CM | POA: Diagnosis not present

## 2015-12-30 MED ORDER — SILDENAFIL CITRATE 20 MG PO TABS: ORAL_TABLET | ORAL | Status: DC

## 2015-12-30 NOTE — Assessment & Plan Note (Signed)
Long history of, most recently since November 2016. RX for Viagra sent to pharmacy.

## 2015-12-30 NOTE — Progress Notes (Signed)
Subjective:    Patient ID: Raymond Willis, male    DOB: 05/20/1961, 55 y.o.   MRN: [id number]  HPI  Mr. Raymond Willis is a 55 year old male who presents today to establish care and discuss the problems mentioned below. Will obtain old records. 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  1) Essential Hypertension: Diagnosed several years ago. Currently managed on lisinopril/HCTZ 20/12.5 mg and amlodipine 10 mg. His blood pressure is slightly above goal in the clinic today at 124/90. He's had his blood pressure medication today. He gets normal readings at [facility] once monthly when he checks. He does feel nervous today.   Denies chest pain, headaches, dizziness. He developed cramping to his left lower extremity several weeks ago, he increased his potassium intake for several days with improvement. Strong FH of high blood pressure.   2) Hyperlipidemia: Diagnosed several years ago. Currently managed on Simvastatin 10 mg.   3) Erectile Dysfunction: History of in the past. Present most recently since November 2016. He has difficulty obtaining and maintaining an erection. This will happen most times during intercourse. He was once trialed on Viagra in the past with improvement. The Cialis didn't help much. He is requesting a prescription for Viagra.    Review of Systems  Eyes: Negative for visual disturbance.  Respiratory: Negative for cough and shortness of breath.   Cardiovascular: Negative for chest pain and leg swelling.  Genitourinary: Negative for difficulty urinating.  Neurological: Negative for dizziness, numbness and headaches.       Past Medical History  Diagnosis Date  . Hypertension   . Hypercholesteremia   . Erectile dysfunction      Social History   Social History  . Marital Status: Married    Spouse Name: N/A  . Number of Children: N/A  . Years of Education: N/A   Occupational History  . Not on file.   Social History  Main Topics  . Smoking status: Never Smoker   . Smokeless tobacco: Not on file  . Alcohol Use: No  . Drug Use: No  . Sexual Activity: No   Other Topics Concern  . Not on file   Social History Narrative   Married.   4 children, 3 grandchildren.   Works at [employer].   Enjoys riding around in his antique car, spending time with his church.        Past Surgical History  Procedure Laterality Date  . Scrotal exploration N/A 09/25/2014    Procedure: SCROTUM EXPLORATION;  Surgeon: Heloise PurpuraLester Borden, MD;  [facility];  Service: Urology;  Laterality: N/A;  . Irrigation and debridement abscess N/A 09/25/2014    Procedure: IRRIGATION AND DEBRIDEMENT ABSCESS;  Surgeon: Heloise PurpuraLester Borden, MD;  [facility];  Service: Urology;  Laterality: N/A;    Family History  Problem Relation Age of Onset  . Hypertension Father   . Lung cancer Mother   . Diabetes Paternal Aunt     Allergies  Allergen Reactions  . Penicillins Hives    Current Outpatient Prescriptions on File Prior to Visit  Medication Sig Dispense Refill  . amLODipine (NORVASC) 10 MG tablet Take 10 mg by mouth daily.     Marland Kitchen. lisinopril-hydrochlorothiazide (PRINZIDE,ZESTORETIC) 20-12.5 MG per tablet Take 1 tablet by mouth daily.     . simvastatin (ZOCOR) 10 MG tablet Take 10 mg by mouth daily at 6 PM.     . VOLTAREN 1 % GEL Apply 2  g topically daily.      No current facility-administered medications on file prior to visit.    BP 124/90 mmHg  Pulse 71  Temp(Src) 97.5 F (36.4 C) (Oral)  Ht 5' 7.5" (1.715 m)  Wt 214 lb 6.4 oz (97.251 kg)  BMI 33.06 kg/m2  SpO2 97%    Objective:   Physical Exam  Constitutional: He is oriented to person, place, and time. He appears well-nourished.  Neck: Neck supple.  Cardiovascular: Normal rate and regular rhythm.   Pulmonary/Chest: Effort normal and breath sounds normal. He has no wheezes. He has no rales.  Neurological: He is alert and oriented to person, place, and time.    Skin: Skin is warm and dry.  Psychiatric: He has a normal mood and affect.          Assessment & Plan:

## 2015-12-30 NOTE — Assessment & Plan Note (Signed)
No recent lipid panel on file. Currently managed on simvastatin 10 mg. Will obtain lipids during upcoming physical this summer.

## 2015-12-30 NOTE — Assessment & Plan Note (Signed)
Long history of, currently managed on lisinopril/hctz and amlodipine. Stable BP at [facility] during checks. Continue current regimen. Will check BMP at upcoming physical.

## 2015-12-30 NOTE — Progress Notes (Signed)
Pre visit review using our clinic review tool, if applicable. No additional management support is needed unless otherwise documented below in the visit note. 

## 2015-12-30 NOTE — Patient Instructions (Signed)
I sent the sildenafil 20 mg tablets to your pharmacy for erectile dysfunction. Take 1 tablet by mouth 30 minutes prior to intercourse as needed. Do not exceed 1 tablet in 24 hours.  Please schedule a physical with me within the next 6 months. You may also schedule a lab only appointment 3-4 days prior. We will discuss your lab results in detail during your physical.  It was a pleasure to meet you today! Please don't hesitate to call me with any questions. Welcome to [facility]!

## 2016-04-28 ENCOUNTER — Encounter: Payer: Self-pay | Admitting: Primary Care

## 2016-05-22 ENCOUNTER — Ambulatory Visit (INDEPENDENT_AMBULATORY_CARE_PROVIDER_SITE_OTHER): Payer: Managed Care, Other (non HMO) | Admitting: Family Medicine

## 2016-05-22 ENCOUNTER — Telehealth: Payer: Self-pay | Admitting: Primary Care

## 2016-05-22 ENCOUNTER — Encounter: Payer: Self-pay | Admitting: Family Medicine

## 2016-05-22 VITALS — BP 156/88 | HR 75 | Temp 98.3°F | Wt 221.5 lb

## 2016-05-22 DIAGNOSIS — M109 Gout, unspecified: Secondary | ICD-10-CM | POA: Diagnosis not present

## 2016-05-22 LAB — URIC ACID: Uric Acid, Serum: 6.2 mg/dL (ref 4.0–7.8)

## 2016-05-22 LAB — BASIC METABOLIC PANEL
BUN: 17 mg/dL (ref 6–23)
CO2: 28 mEq/L (ref 19–32)
Calcium: 8.9 mg/dL (ref 8.4–10.5)
Chloride: 104 mEq/L (ref 96–112)
Creatinine, Ser: 1.27 mg/dL (ref 0.40–1.50)
GFR: 75.57 mL/min (ref >60.00)
Glucose, Bld: 89 mg/dL (ref 70–99)
POTASSIUM: 3.7 meq/L (ref 3.5–5.1)
SODIUM: 139 meq/L (ref 135–145)

## 2016-05-22 MED ORDER — COLCHICINE 0.6 MG PO TABS: 0.6000 mg | ORAL_TABLET | Freq: Every day | ORAL | 1 refills | Status: DC

## 2016-05-22 NOTE — Telephone Encounter (Signed)
See phone note.  Labs okay.  Uric acid not high but this could still be gout.  I would have him try colchicine for a few days and then update us on BP and pain level.  We'll go from there about his meds/options.  Thanks.

## 2016-05-22 NOTE — Progress Notes (Signed)
Possible gout.  About 3 weeks ago, he had pain in the L knee. He has episodic L knee pain at baseline, but this seemed different.  Then he realized he was having L foot pain.  Started on cherries, aleve, for 2 days and got better, resolved.   In the last few days, return of similar sx.  L 1st MTP ttp.  Red and inflamed.  Restarted aleve in the meantime.  Started back on cherries.  This AM the pain is not as bad.   Out of work today.  No trauma.  Less pain if not weight bearing.    Has had episodic BP elevation at MD checks but usually normal at pharmacy checks per patient report.   Meds, vitals, and allergies reviewed.   ROS: Per HPI unless specifically indicated in ROS section   nad ncat rrr L foot with normal inspection except for L 1st MTP ttp and red and slightly puffy.  Normal DP pulse.  Foot not ttp except for L 1st MTP

## 2016-05-22 NOTE — Progress Notes (Signed)
Pre visit review using our clinic review tool, if applicable. No additional management support is needed unless otherwise documented below in the visit note. 

## 2016-05-22 NOTE — Assessment & Plan Note (Signed)
Presumed, path/phys d/w pt.  Check uric acid and bmet today.  Diet handout given.  Can use aleve with GI cautions in short term, add on colchicine, okay to take 2 doses today then 1 dose daily thereafter as needed.  Work note done.   Okay to use cherry juice as preventive in the long term.  Patient on HCTZ and may need BP med alteration depending on BP and uric acid level, as HCTZ can inc uric acid level some.  Will await labs.   Routed to PCP; I will defer longterm BP med adjustment to PCP.

## 2016-05-22 NOTE — Telephone Encounter (Signed)
Pt called checking on his lab results °

## 2016-05-22 NOTE — Telephone Encounter (Signed)
Patient advised.

## 2016-05-22 NOTE — Patient Instructions (Addendum)
Out of work for now.  Start colchicine.  Use the gout diet.   Go to the lab on the way out.  We'll contact you with your lab report. Take colchicine twice today then daily if needed.  Take care.  Glad to see you.

## 2016-06-14 ENCOUNTER — Telehealth: Payer: Self-pay | Admitting: Primary Care

## 2016-06-14 NOTE — Telephone Encounter (Signed)
Fine with me, nice guy!

## 2016-06-14 NOTE — Telephone Encounter (Signed)
Pt called wanting to switch pcp from kate to dr Para Marchduncan Pt is more comfortable with male provider

## 2016-06-15 NOTE — Telephone Encounter (Signed)
Labs 10/19 cpx 10/23 Pt aware Changed pcp

## 2016-06-15 NOTE — Telephone Encounter (Signed)
Okay with me.  Please schedule next CPE with me.  Thanks.  

## 2016-06-15 NOTE — Telephone Encounter (Signed)
Pt would like to be seen next week around Thursday afternoon. Can you make an appt for him? Thanks.

## 2016-06-19 ENCOUNTER — Other Ambulatory Visit: Payer: Self-pay | Admitting: Family Medicine

## 2016-06-19 DIAGNOSIS — I10 Essential (primary) hypertension: Secondary | ICD-10-CM

## 2016-06-19 DIAGNOSIS — Z125 Encounter for screening for malignant neoplasm of prostate: Secondary | ICD-10-CM

## 2016-06-22 ENCOUNTER — Other Ambulatory Visit (INDEPENDENT_AMBULATORY_CARE_PROVIDER_SITE_OTHER): Payer: Managed Care, Other (non HMO)

## 2016-06-22 DIAGNOSIS — Z125 Encounter for screening for malignant neoplasm of prostate: Secondary | ICD-10-CM | POA: Diagnosis not present

## 2016-06-22 DIAGNOSIS — I10 Essential (primary) hypertension: Secondary | ICD-10-CM | POA: Diagnosis not present

## 2016-06-23 LAB — HEPATIC FUNCTION PANEL
ALK PHOS: 82 U/L (ref 39–117)
ALT: 21 U/L (ref 0–53)
AST: 18 U/L (ref 0–37)
Albumin: 4.6 g/dL (ref 3.5–5.2)
BILIRUBIN TOTAL: 0.7 mg/dL (ref 0.2–1.2)
Bilirubin, Direct: 0.1 mg/dL (ref 0.0–0.3)
Total Protein: 7.6 g/dL (ref 6.0–8.3)

## 2016-06-23 LAB — LIPID PANEL
CHOLESTEROL: 169 mg/dL (ref 0–200)
HDL: 43.8 mg/dL (ref >39.00)
LDL Cholesterol: 102 mg/dL — ABNORMAL HIGH (ref 0–99)
NonHDL: 125
Total CHOL/HDL Ratio: 4
Triglycerides: 115 mg/dL (ref 0.0–149.0)
VLDL: 23 mg/dL (ref 0.0–40.0)

## 2016-06-23 LAB — PSA: PSA: 1.58 ng/mL (ref 0.10–4.00)

## 2016-06-26 ENCOUNTER — Encounter: Payer: Managed Care, Other (non HMO) | Admitting: Family Medicine

## 2016-07-06 ENCOUNTER — Encounter: Payer: Self-pay | Admitting: Family Medicine

## 2016-07-06 ENCOUNTER — Ambulatory Visit (INDEPENDENT_AMBULATORY_CARE_PROVIDER_SITE_OTHER): Payer: Managed Care, Other (non HMO) | Admitting: Family Medicine

## 2016-07-06 VITALS — BP 130/82 | HR 73 | Temp 97.8°F | Ht 68.0 in | Wt 220.5 lb

## 2016-07-06 DIAGNOSIS — Z0000 Encounter for general adult medical examination without abnormal findings: Secondary | ICD-10-CM | POA: Diagnosis not present

## 2016-07-06 DIAGNOSIS — I10 Essential (primary) hypertension: Secondary | ICD-10-CM

## 2016-07-06 DIAGNOSIS — Z7189 Other specified counseling: Secondary | ICD-10-CM | POA: Insufficient documentation

## 2016-07-06 DIAGNOSIS — M109 Gout, unspecified: Secondary | ICD-10-CM

## 2016-07-06 DIAGNOSIS — N529 Male erectile dysfunction, unspecified: Secondary | ICD-10-CM

## 2016-07-06 DIAGNOSIS — Z23 Encounter for immunization: Secondary | ICD-10-CM

## 2016-07-06 DIAGNOSIS — E785 Hyperlipidemia, unspecified: Secondary | ICD-10-CM

## 2016-07-06 DIAGNOSIS — Z119 Encounter for screening for infectious and parasitic diseases, unspecified: Secondary | ICD-10-CM

## 2016-07-06 MED ORDER — AMLODIPINE BESYLATE 10 MG PO TABS: 10.0000 mg | ORAL_TABLET | Freq: Every day | ORAL | 3 refills | Status: DC

## 2016-07-06 MED ORDER — SILDENAFIL CITRATE 20 MG PO TABS: 20.0000 mg | ORAL_TABLET | Freq: Every day | ORAL | 99 refills | Status: DC | PRN

## 2016-07-06 MED ORDER — LISINOPRIL-HYDROCHLOROTHIAZIDE 20-12.5 MG PO TABS: 1.0000 | ORAL_TABLET | Freq: Every day | ORAL | 3 refills | Status: DC

## 2016-07-06 MED ORDER — SIMVASTATIN 10 MG PO TABS: 10.0000 mg | ORAL_TABLET | Freq: Every day | ORAL | 3 refills | Status: DC

## 2016-07-06 MED ORDER — COLCHICINE 0.6 MG PO TABS: 0.6000 mg | ORAL_TABLET | Freq: Every day | ORAL | Status: DC | PRN

## 2016-07-06 NOTE — Assessment & Plan Note (Signed)
Living will d/w pt.  Wife designated if patient were incapacitated.   ?

## 2016-07-06 NOTE — Assessment & Plan Note (Signed)
Okay to continue when necessary sildenafil. Discussed with patient about routine dosing. Update me as needed.

## 2016-07-06 NOTE — Progress Notes (Signed)
CPE- See plan.  Routine anticipatory guidance given to patient.  See health maintenance. Tetanus 2017 Flu encouraged.  PNA not due.  Shingles not due.  PSA wnl.  Colonoscopy done ~2013 with [facility] per patient report. Living will d/w pt.  Wife designated if patient were incapacitated.   Diet and exercise d/w pt.  "I need to do better on that."  D/w pt.   HIV screening d/w pt.  Prev done.   Pt opts in for HCV screening.  D/w pt re: routine screening.     Hypertension:    Using medication without problems or lightheadedness: yes Chest pain with exertion:no Edema:no Short of breath:no  Elevated Cholesterol: Using medications without problems:yes Muscle aches: no Diet compliance: d/w pt.  Exercise: yes, walking at work  ED is better with tx in the meantime, needed refill.  No ADE on med.   Gout is better in the meantime.   D/w pt about uric acid level, not being on preventive med at this point, possibly stopping HCTZ in the future if recurrent episodes.  D/w pt about diet and exercise. Good relief with prn colchicine prev.   PMH and SH reviewed  Meds, vitals, and allergies reviewed.   ROS: Per HPI.  Unless specifically indicated otherwise in HPI, the patient denies:  General: fever. Eyes: acute vision changes ENT: sore throat Cardiovascular: chest pain Respiratory: SOB GI: vomiting GU: dysuria Musculoskeletal: acute back pain Derm: acute rash Neuro: acute motor dysfunction Psych: worsening mood Endocrine: polydipsia Heme: bleeding Allergy: hayfever  GEN: nad, alert and oriented HEENT: mucous membranes moist NECK: supple w/o LA CV: rrr. PULM: ctab, no inc wob ABD: soft, +bs EXT: no edema SKIN: no acute rash

## 2016-07-06 NOTE — Assessment & Plan Note (Signed)
Controlled. Continue as is for now. Discussed with patient about diet and exercise.

## 2016-07-06 NOTE — Patient Instructions (Addendum)
Total your calories and cut about 100 calories.  Example: 2100---->2000.  That should help.  I would get a flu shot each fall, when you are feeling better.  Take care.  Glad to see you.

## 2016-07-06 NOTE — Assessment & Plan Note (Signed)
Tetanus 2017 Flu encouraged.  PNA not due.  Shingles not due.  PSA wnl.  Colonoscopy done ~2013 with [facility] per patient report.  Living will d/w pt.  Wife designated if patient were incapacitated.   Diet and exercise d/w pt.  "I need to do better on that."  D/w pt.   HIV screening d/w pt.  Prev done.   Pt opts in for HCV screening.  D/w pt re: routine screening.

## 2016-07-06 NOTE — Progress Notes (Signed)
Pre visit review using our clinic review tool, if applicable. No additional management support is needed unless otherwise documented below in the visit note. 

## 2016-07-06 NOTE — Assessment & Plan Note (Signed)
Reasonable control. Continue statin. No adverse effect on medication. Labs discussed with patient.

## 2016-07-06 NOTE — Assessment & Plan Note (Signed)
Gout is better in the meantime.   D/w pt about uric acid level, not being on preventive med at this point, possibly stopping HCTZ in the future if recurrent episodes.  D/w pt about diet and exercise. Good relief with prn colchicine prev.

## 2016-07-09 ENCOUNTER — Encounter: Payer: Self-pay | Admitting: Family Medicine

## 2017-03-08 ENCOUNTER — Ambulatory Visit (INDEPENDENT_AMBULATORY_CARE_PROVIDER_SITE_OTHER): Payer: Managed Care, Other (non HMO) | Admitting: Family Medicine

## 2017-03-08 ENCOUNTER — Encounter: Payer: Self-pay | Admitting: Family Medicine

## 2017-03-08 DIAGNOSIS — K219 Gastro-esophageal reflux disease without esophagitis: Secondary | ICD-10-CM | POA: Diagnosis not present

## 2017-03-08 NOTE — Progress Notes (Signed)
[place]  He was there for about 3 days.  Ate [employer] hot dogs and fries on the way home.  Felt abnormal there after, gassy and nauseated.  He drank some ginger ale but that didn't help much initially.  Sx gradually eased off a little and he got back to [place].  Sx eventually would resolve but then come back each afternoon. He vomited once but not in the last 4 days.  Yesterday and today have been the best days so far.  He was able to eat at a cook out yesterday w/o troubles, and he ate "everything" yesterday for the 4th of July.  He seems to have more sx with prolonged sitting.  He doesn't have troubles with standing or walking.  No fevers, no diarrhea.  No blood in stool.  No black stools.  No exertional symptoms.  Meds, vitals, and allergies reviewed.   ROS: Per HPI unless specifically indicated in ROS section   GEN: nad, alert and oriented HEENT: mucous membranes moist NECK: supple w/o LA CV: rrr. PULM: ctab, no inc wob ABD: soft, +bs EXT: no edema SKIN: no acute rash

## 2017-03-08 NOTE — Patient Instructions (Signed)
Cut back on greasy fried foods, cut back on portions.   Take zantac 150mg  if needed if you have heartburn.  Take care.  Glad to see you.

## 2017-03-08 NOTE — Assessment & Plan Note (Signed)
Likely GERD related symptoms, related to food choice (especially greasy foods), lack of portion control, and relative compression from central obesity and position (when sitting).  Discussed with patient about options. Weight loss would help. Low-carb diet discussed with patient for weight loss. This along with portion control and avoiding fried foods would likely help. Rationale discussed with patient. If he still having symptoms he can get over-the-counter Zantac and use as needed, 150 mg a day. Update me as needed. No ominous symptoms. Okay for outpatient follow-up. He agrees.

## 2017-03-09 ENCOUNTER — Encounter (HOSPITAL_COMMUNITY): Payer: Self-pay | Admitting: Emergency Medicine

## 2017-03-09 ENCOUNTER — Telehealth: Payer: Self-pay | Admitting: Family Medicine

## 2017-03-09 ENCOUNTER — Emergency Department (HOSPITAL_COMMUNITY): Payer: Managed Care, Other (non HMO)

## 2017-03-09 ENCOUNTER — Emergency Department (HOSPITAL_COMMUNITY)
Admission: EM | Admit: 2017-03-09 | Discharge: 2017-03-10 | Disposition: A | Discharge status: Home / Self Care | Payer: Managed Care, Other (non HMO) | Attending: Emergency Medicine | Admitting: Emergency Medicine

## 2017-03-09 DIAGNOSIS — Z79899 Other long term (current) drug therapy: Secondary | ICD-10-CM | POA: Insufficient documentation

## 2017-03-09 DIAGNOSIS — R109 Unspecified abdominal pain: Secondary | ICD-10-CM | POA: Diagnosis present

## 2017-03-09 DIAGNOSIS — I10 Essential (primary) hypertension: Secondary | ICD-10-CM | POA: Insufficient documentation

## 2017-03-09 LAB — COMPREHENSIVE METABOLIC PANEL
ALT: 24 U/L (ref 17–63)
ANION GAP: 8 (ref 5–15)
AST: 22 U/L (ref 15–41)
Albumin: 4.1 g/dL (ref 3.5–5.0)
Alkaline Phosphatase: 80 U/L (ref 38–126)
BILIRUBIN TOTAL: 0.7 mg/dL (ref 0.3–1.2)
BUN: 15 mg/dL (ref 6–20)
CO2: 25 mmol/L (ref 22–32)
Calcium: 9 mg/dL (ref 8.9–10.3)
Chloride: 106 mmol/L (ref 101–111)
Creatinine, Ser: 1.19 mg/dL (ref 0.61–1.24)
GFR calc Af Amer: >60 mL/min (ref >60)
Glucose, Bld: 143 mg/dL — ABNORMAL HIGH (ref 65–99)
POTASSIUM: 3.5 mmol/L (ref 3.5–5.1)
Sodium: 139 mmol/L (ref 135–145)
TOTAL PROTEIN: 7.7 g/dL (ref 6.5–8.1)

## 2017-03-09 LAB — CBC
HEMATOCRIT: 43.7 % (ref 39.0–52.0)
HEMOGLOBIN: 15.2 g/dL (ref 13.0–17.0)
MCH: 28.6 pg (ref 26.0–34.0)
MCHC: 34.8 g/dL (ref 30.0–36.0)
MCV: 82.1 fL (ref 78.0–100.0)
Platelets: 293 10*3/uL (ref 150–400)
RBC: 5.32 MIL/uL (ref 4.22–5.81)
RDW: 14 % (ref 11.5–15.5)
WBC: 9.4 10*3/uL (ref 4.0–10.5)

## 2017-03-09 LAB — URINALYSIS, ROUTINE W REFLEX MICROSCOPIC
Bilirubin Urine: NEGATIVE
Glucose, UA: NEGATIVE mg/dL
HGB URINE DIPSTICK: NEGATIVE
Ketones, ur: NEGATIVE mg/dL
Leukocytes, UA: NEGATIVE
NITRITE: NEGATIVE
Protein, ur: 30 mg/dL — AB
RBC / HPF: NONE SEEN RBC/hpf (ref 0–5)
SPECIFIC GRAVITY, URINE: 1.021 (ref 1.005–1.030)
Squamous Epithelial / LPF: NONE SEEN
pH: 6 (ref 5.0–8.0)

## 2017-03-09 LAB — LIPASE, BLOOD: Lipase: 33 U/L (ref 11–51)

## 2017-03-09 NOTE — ED Triage Notes (Signed)
Pt from home with complaints of upper middle abdominal pain x 1 week. Pt states pain is worse following eating. Pt states pain is sharp and is decreased if he walks following eating. Pt states he saw his PCP for this and was told to take zantac. Pt just got his zantac filled and has not taken any. Pt also states his bowel movements have not been as regular lately.

## 2017-03-09 NOTE — ED Provider Notes (Signed)
[facility]-EMERGENCY DEPT Provider Note   CSN: [id number] Arrival date & time: 03/09/17  2012     History   Chief Complaint Chief Complaint  Patient presents with  . Abdominal Pain    HPI Raymond Willis is a 56 y.o. male.  The history is provided by the patient and medical records.  Abdominal Pain      56 year old male with history of hyperlipidemia, hypertension, prediabetes, vitamin D deficiency, GERD, presenting to the ED for abdominal pain. Patient reports he was traveling last week and stopped at a maintenance hot dog stand a hot dog. States after eating he felt very bloated with a lot of "gas".  States this lasted for a few hours before resolved. States he had pain again 2 days later when he went to the movies announcing his wife. He states he found that the got up and walk after eating, pain was much less intense and bloating with some quicker. States ginger ale has been helping some too.  States he did have one episode of vomiting earlier in the week, none since then. States on July 4, 2 days ago, he ate very heavily at a cook out and had a lot of abdominal pain again. States throughout these episodes he is continued to have normal bowel movements that is not had any further vomiting. States he has been sleeping on his stomach which seems to help as well. States he has been belching and wife states his breath smells like "rotten eggs".  States he has been able to pass gas with needed. Bowel movement has been regular, maybe slightly smaller in caliber than regularly.  He has not had any blood in the stool. No fever or chills. No urinary symptoms.  No prior abdominal surgeries, did have scrotal abscess with exploration a few years ago.  Past Medical History:  Diagnosis Date  . Erectile dysfunction   . Hypercholesteremia   . Hypertension   . Insomnia   . Prediabetes   . Vitamin D deficiency     Patient Active Problem List   Diagnosis Date Noted  . GERD (gastroesophageal reflux  disease) 03/08/2017  . Routine general medical examination at a health care facility 07/06/2016  . Advance care planning 07/06/2016  . Gout 05/22/2016  . Erectile dysfunction 12/30/2015  . Essential hypertension 12/30/2015  . Hyperlipidemia 12/30/2015    Past Surgical History:  Procedure Laterality Date  . IRRIGATION AND DEBRIDEMENT ABSCESS N/A 09/25/2014   Procedure: IRRIGATION AND DEBRIDEMENT ABSCESS;  Surgeon: Heloise PurpuraLester Borden, MD;  [facility];  Service: Urology;  Laterality: N/A;  . SCROTAL EXPLORATION N/A 09/25/2014   Procedure: SCROTUM EXPLORATION;  Surgeon: Heloise PurpuraLester Borden, MD;  [facility];  Service: Urology;  Laterality: N/A;       Home Medications    Prior to Admission medications   Medication Sig Start Date End Date Taking? Authorizing Provider  acetaminophen (TYLENOL) 325 MG tablet Take 650 mg by mouth every 6 (six) hours as needed for moderate pain.   Yes [provider]  amLODipine (NORVASC) 10 MG tablet Take 1 tablet (10 mg total) by mouth daily. 07/06/16  Yes Joaquim Namuncan, [name], MD  lisinopril-hydrochlorothiazide (PRINZIDE,ZESTORETIC) 20-12.5 MG tablet Take 1 tablet by mouth daily. 07/06/16  Yes Joaquim Namuncan, [name], MD  sildenafil (REVATIO) 20 MG tablet Take 1-5 tablets (20-100 mg total) by mouth daily as needed. 07/06/16  Yes Joaquim Namuncan, [name], MD  simvastatin (ZOCOR) 10 MG tablet Take 1 tablet (10 mg total) by mouth daily  at 6 PM. 07/06/16  Yes Joaquim Nam, MD  triamcinolone cream (KENALOG) 0.5 % Apply 1 application topically 2 (two) times daily as needed. Rash 09/24/15  Yes [provider]  VOLTAREN 1 % GEL Apply 2 g topically daily.  09/22/14  Yes [provider]  colchicine 0.6 MG tablet Take 1 tablet (0.6 mg total) by mouth daily as needed. Okay to fill with mitigare or colcrys if cheaper. Patient not taking: Reported on 03/09/2017 07/06/16   Joaquim Nam, MD    Family History Family History  Problem Relation Age of Onset  .  Hypertension Father   . Lung cancer Mother   . Diabetes Paternal Aunt   . Colon cancer Neg Hx   . Prostate cancer Neg Hx     Social History Social History  Substance Use Topics  . Smoking status: Never Smoker  . Smokeless tobacco: Never Used  . Alcohol use No     Allergies   Penicillins   Review of Systems Review of Systems  Gastrointestinal: Positive for abdominal pain.  All other systems reviewed and are negative.    Physical Exam Updated Vital Signs BP (!) 136/100 (BP Location: Right Arm)   Pulse 78   Temp 98.2 F (36.8 C) (Oral)   Resp 18   SpO2 96%   Physical Exam  Constitutional: He is oriented to person, place, and time. He appears well-developed and well-nourished.  HENT:  Head: Normocephalic and atraumatic.  Mouth/Throat: Oropharynx is clear and moist.  Eyes: Conjunctivae and EOM are normal. Pupils are equal, round, and reactive to light.  Neck: Normal range of motion.  Cardiovascular: Normal rate, regular rhythm and normal heart sounds.   Pulmonary/Chest: Effort normal and breath sounds normal. No respiratory distress. He has no wheezes.  Abdominal: Soft. Bowel sounds are normal. There is no tenderness. There is no rebound.  Abdomen soft, does not appear distended, no focal tenderness or guarding, normal bowel sounds, no fluid wave  Musculoskeletal: Normal range of motion.  Neurological: He is alert and oriented to person, place, and time.  Skin: Skin is warm and dry.  Psychiatric: He has a normal mood and affect.  Nursing note and vitals reviewed.    ED Treatments / Results  Labs (all labs ordered are listed, but only abnormal results are displayed) Labs Reviewed  COMPREHENSIVE METABOLIC PANEL - Abnormal; Notable for the following:       Result Value   Glucose, Bld 143 (*)    All other components within normal limits  URINALYSIS, ROUTINE W REFLEX MICROSCOPIC - Abnormal; Notable for the following:    Protein, ur 30 (*)    Bacteria, UA RARE  (*)    All other components within normal limits  LIPASE, BLOOD  CBC    EKG  EKG Interpretation None       Radiology Dg Abd Acute W/chest  Result Date: 03/09/2017 CLINICAL DATA:  Initial evaluation for bloating sensation, decreased bowel movements. EXAM: DG ABDOMEN ACUTE W/ 1V CHEST COMPARISON:  None. FINDINGS: There is no evidence of dilated bowel loops or free intraperitoneal air. No radiopaque calculi or other significant radiographic abnormality is seen. Heart size and mediastinal contours are within normal limits. Both lungs are clear. IMPRESSION: Negative abdominal radiographs.  No acute cardiopulmonary disease. Electronically Signed   By: Rise Mu M.D.   On: 03/09/2017 23:35    Procedures Procedures (including critical care time)  Medications Ordered in ED Medications - No data to display  Initial Impression / Assessment and Plan / ED Course  I have reviewed the triage vital signs and the nursing notes.  Pertinent labs & imaging results that were available during my care of the patient were reviewed by me and considered in my medical decision making (see chart for details).  56 year old male here with intermittent abdominal pain over the past week. This seems to occur with eating and is associated with bloating sensation. This improves with walking, Gas-X, or drinking ginger ale.  Patient is afebrile and nontoxic. His abdomen is soft and benign. Screening lab work is reassuring. Acute abdominal series without evidence of obstruction or free air. Given the nature of his symptoms, seems he is experiencing gas pains/distention after eating. Has had snack and fluids here without issue.  Abdomen remains soft/benign.  After talking with him, it seems he has been eating rather heavy meals recently that are not necessarily nutritious (hotdogs, hamburgers, ribs, chicken wings, etc).  This may be contributing.  Recommended diet modification, smaller meals allowing time for  proper digestion, continue Gas-X as needed, dvised not to lay down immediately after eating. Can follow-up with PCP. Given referral to GI if ongoing or worsening symptoms.  Discussed plan with patient, he acknowledged understanding and agreed with plan of care.  Return precautions given for new or worsening symptoms.  Final Clinical Impressions(s) / ED Diagnoses   Final diagnoses:  Abdominal pain, unspecified abdominal location    New Prescriptions New Prescriptions   No medications on file     Garlon Hatchet, PA-C 03/10/17 0046    Derwood Kaplan, MD 03/10/17 2321

## 2017-03-09 NOTE — Telephone Encounter (Signed)
[facility] - Client TELEPHONE ADVICE RECORD [facility] Patient Name: Raymond Willis DOB: 09/09/1960 Initial Comment Caller has been having stomach pain. Nurse Assessment Nurse: Trudie ReedBurrell, RN, Tabatha Date/Time (Eastern Time): 03/09/2017 4:44:44 PM Confirm and document reason for call. If symptomatic, describe symptoms. ---Caller has been having stomach pain. pain coming and going after he eats. No fever Does the patient have any new or worsening symptoms? ---Yes Will a triage be completed? ---Yes Related visit to physician within the last 2 weeks? ---Yes Does the PT have any chronic conditions? (i.e. diabetes, asthma, etc.) ---Yes List chronic conditions. ---hypertension Is this a behavioral health or substance abuse call? ---NoGuidelines Guideline Title Affirmed Question Affirmed Notes Abdominal Pain - Male [1] SEVERE pain (e.g., excruciating) AND [2] present > 1 hour Final Disposition User Go to ED Now Trudie ReedBurrell, RN, Tabatha Referrals [facility] - ED Disagree/Comply: Comply Call Id: [phone]

## 2017-03-10 NOTE — Discharge Instructions (Signed)
Your labs and x-ray today were normal. Recommend to continue gas-x when needed.  Try to eat smaller meals, take breaks in between if needed to help digestion.  Try to avoid extra fatty/processed foods.  Can continue walking around a little after eating, don't lay down immediately after eating. Follow-up with GI if you continue having ongoing issues-- may need to let your primary care doctor know about this as well for formal referral. Return to the ED for new or worsening symptoms.

## 2017-03-10 NOTE — ED Notes (Signed)
PA notified of pt.'s latest BP , ok for discharge. Pt. satetd that he missed his BP meds last night and will take a dose once home. Denied of any distress

## 2017-03-12 ENCOUNTER — Telehealth: Payer: Self-pay

## 2017-03-12 DIAGNOSIS — R109 Unspecified abdominal pain: Secondary | ICD-10-CM

## 2017-03-12 NOTE — Telephone Encounter (Signed)
Pt went to [facility] ED on 03/09/17.

## 2017-03-12 NOTE — Telephone Encounter (Signed)
Pt was seen 03/08/17 and pt was also [facility]. Pt was advised to see GI doctor; pt wants to make sure Dr Para Marchuncan is aware pt was seen at ED and request assistance getting GI referral.Please advise.

## 2017-03-12 NOTE — Telephone Encounter (Signed)
Aware, ordered, thanks.

## 2017-03-27 ENCOUNTER — Encounter: Payer: Self-pay | Admitting: Family Medicine

## 2017-03-27 ENCOUNTER — Telehealth: Payer: Self-pay | Admitting: Family Medicine

## 2017-03-27 NOTE — Telephone Encounter (Signed)
Raymond Willis from [facility] called, pt did not show up for his GI appointment that we referred him to.  No cb needed

## 2017-03-27 NOTE — Telephone Encounter (Signed)
Noted. Thanks.

## 2017-04-04 HISTORY — PX: COLONOSCOPY: SHX174

## 2017-04-09 ENCOUNTER — Encounter: Payer: Self-pay | Admitting: Family Medicine

## 2017-04-09 LAB — HM COLONOSCOPY

## 2017-04-24 ENCOUNTER — Encounter: Payer: Self-pay | Admitting: Family Medicine

## 2017-06-25 ENCOUNTER — Ambulatory Visit (INDEPENDENT_AMBULATORY_CARE_PROVIDER_SITE_OTHER): Payer: Managed Care, Other (non HMO) | Admitting: Family Medicine

## 2017-06-25 ENCOUNTER — Encounter: Payer: Self-pay | Admitting: Family Medicine

## 2017-06-25 DIAGNOSIS — J069 Acute upper respiratory infection, unspecified: Secondary | ICD-10-CM | POA: Diagnosis not present

## 2017-06-25 MED ORDER — DOXYCYCLINE HYCLATE 100 MG PO TABS: 100.0000 mg | ORAL_TABLET | Freq: Two times a day (BID) | ORAL | 0 refills | Status: DC

## 2017-06-25 MED ORDER — FLUTICASONE PROPIONATE 50 MCG/ACT NA SUSP
2.0000 | Freq: Every day | NASAL | 1 refills | Status: DC
Start: 2017-06-25 — End: 2017-09-19

## 2017-06-25 NOTE — Progress Notes (Signed)
Sx started a little over 1 week ago.  Nighttime mucinex helps some but he has more rhinorrhea and congestion and cough during the day.  He isn't getting better.  No fevers.  No vomiting, no diarrhea.  No ear pain.  ST initially, less now.  No wheeze.  No chest pain.   He is working on his diet to try to limit trigger foods.  He cut out hot dogs.  Broccoli and peanuts also seemed to make him feel worse.  He is some better with his GERD/abd pain in the meantime.    His father in law died and offered my condolences.    Meds, vitals, and allergies reviewed.   ROS: Per HPI unless specifically indicated in ROS section   GEN: nad, alert and oriented HEENT: mucous membranes moist, tm w/o erythema, nasal exam w/o erythema, clear discharge noted,  OP with cobblestoning, sinuses not ttp x4 NECK: supple w/o LA CV: rrr.   PULM: ctab, no inc wob EXT: no edema

## 2017-06-25 NOTE — Patient Instructions (Signed)
Try adding on flonase.  Get some rest and drink plenty of fluids.   If not better, then start doxycycline.   Take care.  Glad to see you.  Update me as needed.

## 2017-06-26 DIAGNOSIS — J069 Acute upper respiratory infection, unspecified: Secondary | ICD-10-CM | POA: Insufficient documentation

## 2017-06-26 NOTE — Assessment & Plan Note (Signed)
Unclear if this is viral or bacterial. Differential diagnosis discussed with patient. Supportive care. Nontoxic. Add on Flonase. If not improved in the next few days then start doxycycline. Routine cautions given. He agrees.

## 2017-08-06 ENCOUNTER — Telehealth: Payer: Self-pay | Admitting: Family Medicine

## 2017-08-06 NOTE — Telephone Encounter (Signed)
Copied from CRM [id number]. Topic: Quick Communication - See Telephone Encounter >> Aug 06, 2017 12:16 PM Eston Mouldavis, Tanairi Cypert B wrote: CRM for notification. See Telephone encounter for:  Refill zocor and amlodipine [facility] [address]  Pt also has questions about amlodipine

## 2017-08-07 MED ORDER — SIMVASTATIN 10 MG PO TABS: 10.0000 mg | ORAL_TABLET | Freq: Every day | ORAL | 0 refills | Status: DC

## 2017-08-07 MED ORDER — AMLODIPINE BESYLATE 10 MG PO TABS: 10.0000 mg | ORAL_TABLET | Freq: Every day | ORAL | 0 refills | Status: DC

## 2017-08-07 NOTE — Telephone Encounter (Signed)
Left message letting pt know that medication was going to be filled and to call office back about questions on amlodipine.

## 2017-08-13 ENCOUNTER — Telehealth: Payer: Self-pay | Admitting: Family Medicine

## 2017-08-13 NOTE — Telephone Encounter (Signed)
Copied from CRM [id number]. Topic: Quick Communication - Rx Refill/Question >> Aug 13, 2017  3:33 PM Crist InfanteHarrald, Kathy J wrote: Has the patient contacted their pharmacy? {yes  Pt request refill of lisinopril-hydrochlorothiazide (PRINZIDE,ZESTORETIC) 20-12.5 MG tablet  Pt states he called last week (12?03) and thought his  lisinopril-hydrochlorothiazide (PRINZIDE,ZESTORETIC) 20-12.5 MG tablet was part of the refill request.  But was not.  Pt states he always get a 90 day and the last rx fill of amLODipine (NORVASC) 10 MG tablet when he called 12/03 was only 30 tabs   Preferred Pharmacy (with phone number or street name):  [facility] [place], [place] - [address] [phone] (Phone) [phone] (Fax)  Pt states he is out of the lisinopril and needs asap

## 2017-08-14 ENCOUNTER — Other Ambulatory Visit: Payer: Self-pay | Admitting: *Deleted

## 2017-08-14 MED ORDER — LISINOPRIL-HYDROCHLOROTHIAZIDE 20-12.5 MG PO TABS: 1.0000 | ORAL_TABLET | Freq: Every day | ORAL | 0 refills | Status: DC

## 2017-08-14 NOTE — Telephone Encounter (Signed)
Patient notified that he needs to schedule an annual exam for follow up to his BP medications- he is presently driving and will call back to schedule. Told patient we would send over his Rx for lisinopril.

## 2017-08-16 NOTE — Telephone Encounter (Signed)
Left voicemail for patient to schedule physical exam and ask the agent to notify me and a RF will be sent in.

## 2017-08-16 NOTE — Telephone Encounter (Signed)
I think 30 day supply was send since he is due for CPE. Please schedule.  We can send 90 days supplies on the next rx.  Thanks .

## 2017-08-21 NOTE — Telephone Encounter (Signed)
Spoke with patient and advised him to call in for the CPE tomorrow, prior to needing more medication.

## 2017-09-05 ENCOUNTER — Other Ambulatory Visit: Payer: Self-pay | Admitting: Family Medicine

## 2017-09-05 DIAGNOSIS — I10 Essential (primary) hypertension: Secondary | ICD-10-CM

## 2017-09-05 DIAGNOSIS — Z125 Encounter for screening for malignant neoplasm of prostate: Secondary | ICD-10-CM

## 2017-09-05 DIAGNOSIS — M1000 Idiopathic gout, unspecified site: Secondary | ICD-10-CM

## 2017-09-05 DIAGNOSIS — Z1159 Encounter for screening for other viral diseases: Secondary | ICD-10-CM

## 2017-09-12 ENCOUNTER — Telehealth: Payer: Self-pay | Admitting: Family Medicine

## 2017-09-12 ENCOUNTER — Other Ambulatory Visit (INDEPENDENT_AMBULATORY_CARE_PROVIDER_SITE_OTHER): Payer: Managed Care, Other (non HMO)

## 2017-09-12 DIAGNOSIS — Z125 Encounter for screening for malignant neoplasm of prostate: Secondary | ICD-10-CM | POA: Diagnosis not present

## 2017-09-12 DIAGNOSIS — Z1159 Encounter for screening for other viral diseases: Secondary | ICD-10-CM

## 2017-09-12 DIAGNOSIS — M1000 Idiopathic gout, unspecified site: Secondary | ICD-10-CM | POA: Diagnosis not present

## 2017-09-12 DIAGNOSIS — I10 Essential (primary) hypertension: Secondary | ICD-10-CM

## 2017-09-12 LAB — COMPREHENSIVE METABOLIC PANEL
ALK PHOS: 86 U/L (ref 39–117)
ALT: 19 U/L (ref 0–53)
AST: 16 U/L (ref 0–37)
Albumin: 4.3 g/dL (ref 3.5–5.2)
BILIRUBIN TOTAL: 0.7 mg/dL (ref 0.2–1.2)
BUN: 17 mg/dL (ref 6–23)
CO2: 29 meq/L (ref 19–32)
CREATININE: 1.24 mg/dL (ref 0.40–1.50)
Calcium: 9.4 mg/dL (ref 8.4–10.5)
Chloride: 102 mEq/L (ref 96–112)
GFR: 77.32 mL/min (ref >60.00)
Glucose, Bld: 92 mg/dL (ref 70–99)
Potassium: 3.7 mEq/L (ref 3.5–5.1)
SODIUM: 140 meq/L (ref 135–145)
TOTAL PROTEIN: 6.9 g/dL (ref 6.0–8.3)

## 2017-09-12 LAB — LIPID PANEL
CHOL/HDL RATIO: 4
Cholesterol: 171 mg/dL (ref 0–200)
HDL: 44.7 mg/dL (ref >39.00)
LDL Cholesterol: 108 mg/dL — ABNORMAL HIGH (ref 0–99)
NONHDL: 126.68
Triglycerides: 95 mg/dL (ref 0.0–149.0)
VLDL: 19 mg/dL (ref 0.0–40.0)

## 2017-09-12 LAB — PSA: PSA: 1.5 ng/mL (ref 0.10–4.00)

## 2017-09-12 LAB — URIC ACID: URIC ACID, SERUM: 7.3 mg/dL (ref 4.0–7.8)

## 2017-09-12 MED ORDER — SIMVASTATIN 10 MG PO TABS: 10.0000 mg | ORAL_TABLET | Freq: Every day | ORAL | 0 refills | Status: DC

## 2017-09-12 NOTE — Telephone Encounter (Signed)
Sent. Thanks.   

## 2017-09-12 NOTE — Telephone Encounter (Signed)
Patient has appointment scheduled 09/19/17. Okay to refill until appointment?

## 2017-09-12 NOTE — Telephone Encounter (Addendum)
simvastatin only three pills remain. Pt appt next week. [facility] [address]. Pt ph [phone].

## 2017-09-13 LAB — HEPATITIS C ANTIBODY
Hepatitis C Ab: NONREACTIVE
SIGNAL TO CUT-OFF: 0.05 (ref <1.00)

## 2017-09-19 ENCOUNTER — Ambulatory Visit (INDEPENDENT_AMBULATORY_CARE_PROVIDER_SITE_OTHER): Payer: Managed Care, Other (non HMO) | Admitting: Family Medicine

## 2017-09-19 ENCOUNTER — Encounter: Payer: Self-pay | Admitting: Family Medicine

## 2017-09-19 VITALS — BP 110/70 | HR 78 | Temp 98.9°F | Ht 67.6 in | Wt 211.5 lb

## 2017-09-19 DIAGNOSIS — M1000 Idiopathic gout, unspecified site: Secondary | ICD-10-CM

## 2017-09-19 DIAGNOSIS — Z23 Encounter for immunization: Secondary | ICD-10-CM | POA: Diagnosis not present

## 2017-09-19 DIAGNOSIS — E785 Hyperlipidemia, unspecified: Secondary | ICD-10-CM

## 2017-09-19 DIAGNOSIS — Z0000 Encounter for general adult medical examination without abnormal findings: Secondary | ICD-10-CM | POA: Diagnosis not present

## 2017-09-19 DIAGNOSIS — Z7189 Other specified counseling: Secondary | ICD-10-CM

## 2017-09-19 DIAGNOSIS — L853 Xerosis cutis: Secondary | ICD-10-CM

## 2017-09-19 DIAGNOSIS — M2550 Pain in unspecified joint: Secondary | ICD-10-CM

## 2017-09-19 DIAGNOSIS — N529 Male erectile dysfunction, unspecified: Secondary | ICD-10-CM

## 2017-09-19 DIAGNOSIS — I10 Essential (primary) hypertension: Secondary | ICD-10-CM

## 2017-09-19 DIAGNOSIS — K219 Gastro-esophageal reflux disease without esophagitis: Secondary | ICD-10-CM

## 2017-09-19 MED ORDER — TRIAMCINOLONE ACETONIDE 0.5 % EX CREA: 1.0000 "application " | TOPICAL_CREAM | Freq: Two times a day (BID) | CUTANEOUS | 1 refills | Status: DC | PRN

## 2017-09-19 MED ORDER — AMLODIPINE BESYLATE 10 MG PO TABS: 10.0000 mg | ORAL_TABLET | Freq: Every day | ORAL | 3 refills | Status: DC

## 2017-09-19 MED ORDER — SIMVASTATIN 10 MG PO TABS: 10.0000 mg | ORAL_TABLET | Freq: Every day | ORAL | 3 refills | Status: DC

## 2017-09-19 MED ORDER — LISINOPRIL-HYDROCHLOROTHIAZIDE 20-12.5 MG PO TABS: 1.0000 | ORAL_TABLET | Freq: Every day | ORAL | 3 refills | Status: DC

## 2017-09-19 MED ORDER — COLCHICINE 0.6 MG PO TABS: 0.6000 mg | ORAL_TABLET | Freq: Every day | ORAL | 1 refills | Status: DC | PRN

## 2017-09-19 MED ORDER — DICLOFENAC SODIUM 1 % TD GEL: 2.0000 g | Freq: Every day | TRANSDERMAL | 3 refills | Status: DC

## 2017-09-19 NOTE — Patient Instructions (Signed)
Thanks for talking to me today.  Glad to see you.  Update me as needed.  Stay off sildenafil for now.  Keep working on diet and exercise.

## 2017-09-19 NOTE — Progress Notes (Signed)
CPE- See plan.  Routine anticipatory guidance given to patient.  See health maintenance.  The possibility exists that previously documented standard health maintenance information may have been brought forward from a previous encounter into this note.  If needed, that same information has been updated to reflect the current situation based on today's encounter.    Tetanus 2017 Flu 09/09/17.  PNA not due.  Shingles not due.  PSA wnl.  Colonoscopy done 2018 with [facility].  Living will d/w pt.  Wife designated if patient were incapacitated.   Diet and exercise d/w pt. He has done better in the meantime with both.  D/w pt.   HIV screening d/w pt.  Prev done. HCV neg.   Hypertension:    Using medication without problems or lightheadedness: yes Chest pain with exertion:no Edema:no Short of breath:no D/w pt about ACE use and routine cautions. No h/o angioedema.  No cough.    Elevated Cholesterol: Using medications without problems:yes Muscle aches: knee pain episodically flares Diet compliance: yes Exercise:yes   Gout.  No recent flares.  Still on HCTZ, likely reasonable to continue with no flares.  Has colchicine to use if needed.    D/w pt about possible abd bloating and nausea from sildenafil.  Reasonable to avoid use for a while as a trial.    Dry skin on back.  Using OTC creams prn.  Itchy.  No ulceration.   PMH and SH reviewed  Meds, vitals, and allergies reviewed.   ROS: Per HPI.  Unless specifically indicated otherwise in HPI, the patient denies:  General: fever. Eyes: acute vision changes ENT: sore throat Cardiovascular: chest pain Respiratory: SOB GI: vomiting GU: dysuria Musculoskeletal: acute back pain Derm: acute rash Neuro: acute motor dysfunction Psych: worsening mood Endocrine: polydipsia Heme: bleeding Allergy: hayfever  GEN: nad, alert and oriented HEENT: mucous membranes moist NECK: supple w/o LA CV: rrr. PULM: ctab, no inc wob ABD: soft, +bs EXT: no  edema SKIN: no acute rash but chronic dry skin noted on the back with some areas of postinflammatory hyperpigmentation.  No ulceration.

## 2017-09-20 DIAGNOSIS — L853 Xerosis cutis: Secondary | ICD-10-CM | POA: Insufficient documentation

## 2017-09-20 DIAGNOSIS — M2550 Pain in unspecified joint: Secondary | ICD-10-CM | POA: Insufficient documentation

## 2017-09-20 NOTE — Assessment & Plan Note (Signed)
Living will d/w pt.  Wife designated if patient were incapacitated.   ?

## 2017-09-20 NOTE — Assessment & Plan Note (Signed)
He may have had GI upset triggered by sildenafil use.  dw pt.  He'll stop med for now and update me as needed.  Prev GI eval done re: GI sx.

## 2017-09-20 NOTE — Assessment & Plan Note (Signed)
See above

## 2017-09-20 NOTE — Assessment & Plan Note (Signed)
No recent flares.  Continue HCTZ for now.  He has colchicine to use as needed.

## 2017-09-20 NOTE — Assessment & Plan Note (Signed)
Improved with topical voltaren gel.  D/w pt.  He agrees.  Continue as is.

## 2017-09-20 NOTE — Assessment & Plan Note (Signed)
D/w pt about ACE use and routine cautions. No h/o angioedema.  No cough.  reasonable to continue meds as is.  Labs d/w pt.  He agrees.  Continue work on diet and exercise.

## 2017-09-20 NOTE — Assessment & Plan Note (Signed)
Tetanus 2017 Flu 09/09/17.  PNA not due.  Shingles not due.  PSA wnl.  Colonoscopy done 2018 with [facility].  Living will d/w pt.  Wife designated if patient were incapacitated.   Diet and exercise d/w pt. He has done better in the meantime with both.  D/w pt.   HIV screening d/w pt.  Prev done. HCV neg.

## 2017-09-20 NOTE — Assessment & Plan Note (Signed)
Tolerating statin, continue as is.  Continue work on diet and exercise.

## 2017-09-20 NOTE — Assessment & Plan Note (Signed)
Discussed with patient about using an over-the-counter moisturizer more frequent and if he has a profoundly itchy spot he can temporarily use topical triamcinolone.  Update me as needed.  Routine steroid cautions given. He agrees.

## 2017-11-27 ENCOUNTER — Telehealth: Payer: Self-pay | Admitting: Family Medicine

## 2017-11-27 MED ORDER — AMLODIPINE BESYLATE 10 MG PO TABS: 10.0000 mg | ORAL_TABLET | Freq: Every day | ORAL | 3 refills | Status: DC

## 2017-11-27 NOTE — Telephone Encounter (Signed)
Copied from CRM [id number]. Topic: Quick Communication - See Telephone Encounter >> Nov 27, 2017  4:24 PM Rudi CocoLathan, Jiya Kissinger M, NT wrote: CRM for notification. See Telephone encounter for: 11/27/17.  Pt. Wife calling to let Dr. Para Marchuncan know that a Pre auth. Needed For amLODipine (NORVASC) 10 MG tablet [[id number]][[id number]] pt. Is completely out of med.  Pt. Wife also stated that pt. Had came in office for an appt. About 1-2 months ago about this particular med. So pt. Shouldn't run into this problem again. [facility] [address]   [facility] [place] [place], [place] - [address] [place] [id number] Phone: [phone] Fax: [phone]

## 2017-11-27 NOTE — Telephone Encounter (Signed)
I have not received any notification of a PA needed for this patient's medication.  I did, however, resend the Rx in case this is what the wife meant.

## 2018-03-08 ENCOUNTER — Telehealth: Payer: Self-pay

## 2018-03-08 DIAGNOSIS — R1084 Generalized abdominal pain: Secondary | ICD-10-CM

## 2018-03-08 NOTE — Telephone Encounter (Signed)
Pt last seen annual exam 09/09/17. I spoke with pt; pt having more upper abdominal pain and bloating; symptoms have been going on and off for 1 year. Now when pt eats any uncooked vegetables, fruits or nuts. First of this week has eaten spinach and cashews. Pt vomited x 3 on 03/04/18.  no constipation but pt last BM was this morning; no BM all week. BM this AM was loose stool like diarrhea; pt did not look to see if any mucus or blood. abd pain now is pain level 4. Pt passing minimal amt of gas. No fever. pts mother and pts aunt has diverticulosis and other stomach issues. Pt does not want to schedule appt until Dr Para Marchuncan reviews note; pt thinks since going on and off for one year should see GI. Advised pt if condition worsens with severe abd pain,vomiting, diarrhea or blood in stool pt should go to ED. Pt voiced understanding.

## 2018-03-08 NOTE — Telephone Encounter (Signed)
Agree if condition worsens with severe abd pain,vomiting, diarrhea or blood in stool pt should go to ED. Reasonable to see GI or come here.  He had seen [facility] prev.  He didn't have diverticulosis noted on prev colonoscopy.  Would avoid trigger foods in the meantime.   I put in the referral.   Thanks.

## 2018-03-08 NOTE — Telephone Encounter (Signed)
Copied from CRM [id number]. Topic: Referral - Request >> Mar 08, 2018  9:33 AM Mare LoanBurton, Donna F wrote: Pt is requesting a referral to a Gi provider due to bloating and pain in the upper stomach -at first it was when he ate certain things but now it is getting worse no matter what he eats  Best number [phone]

## 2018-03-09 ENCOUNTER — Ambulatory Visit (HOSPITAL_COMMUNITY)
Admission: EM | Admit: 2018-03-09 | Discharge: 2018-03-09 | Disposition: A | Discharge status: Home / Self Care | Payer: Managed Care, Other (non HMO) | Attending: Emergency Medicine | Admitting: Emergency Medicine

## 2018-03-09 DIAGNOSIS — K219 Gastro-esophageal reflux disease without esophagitis: Secondary | ICD-10-CM

## 2018-03-09 DIAGNOSIS — G8929 Other chronic pain: Secondary | ICD-10-CM | POA: Diagnosis not present

## 2018-03-09 DIAGNOSIS — R1013 Epigastric pain: Secondary | ICD-10-CM

## 2018-03-09 MED ORDER — GI COCKTAIL ~~LOC~~
ORAL | Status: AC
  Filled 2018-03-09: qty 30

## 2018-03-09 MED ORDER — GI COCKTAIL ~~LOC~~
30.0000 mL | Freq: Once | ORAL | Status: AC
  Administered 2018-03-09: 30 mL via ORAL

## 2018-03-09 MED ORDER — PANTOPRAZOLE SODIUM 20 MG PO TBEC: 20.0000 mg | DELAYED_RELEASE_TABLET | Freq: Every day | ORAL | 0 refills | Status: DC

## 2018-03-09 NOTE — ED Provider Notes (Signed)
[facility]   [id number] 03/09/18 Arrival Time: 1641  SUBJECTIVE:  Raymond Willis is a 57 y.o. male who presents with complaint of worsening intermittent abdominal discomfort that began 1 year ago.  Denies a precipitating event, or trauma.  Localizes pain to epigastric.  Describes as intermittent and throbbing in character.  Has tried prilosec with temporary relief.  Reports worsening symptoms with eating hotdogs, broccoli, and cashews. Admits to pain a couple of hours after eating.  Denies similar symptoms in the past.  Last BM yesterday and loose stools.    Has been seen for this problem by PCP and in the ED with negative studies. Dx'ed with acid reflux and treated with prilosec and zantac.    Denies fever, chills, appetite changes, weight changes, nausea, vomiting, chest pain, SOB, diarrhea, constipation, hematochezia, melena, dysuria, difficulty urinating, increased frequency or urgency, flank pain, loss of bowel or bladder function.  No LMP for male patient.  ROS: As per HPI.  Past Medical History:  Diagnosis Date  . Erectile dysfunction   . Hypercholesteremia   . Hypertension   . Insomnia   . Prediabetes   . Vitamin D deficiency    Past Surgical History:  Procedure Laterality Date  . IRRIGATION AND DEBRIDEMENT ABSCESS N/A 09/25/2014   Procedure: IRRIGATION AND DEBRIDEMENT ABSCESS;  Surgeon: Heloise PurpuraLester Borden, MD;  [facility];  Service: Urology;  Laterality: N/A;  . SCROTAL EXPLORATION N/A 09/25/2014   Procedure: SCROTUM EXPLORATION;  Surgeon: Heloise PurpuraLester Borden, MD;  [facility];  Service: Urology;  Laterality: N/A;   Allergies  Allergen Reactions  . Penicillins Hives  . Sildenafil     Possible cause of GI upset   No current facility-administered medications on file prior to encounter.    Current Outpatient Medications on File Prior to Encounter  Medication Sig Dispense Refill  . acetaminophen (TYLENOL) 325 MG tablet Take 650 mg by mouth every 6 (six) hours  as needed for moderate pain.    Marland Kitchen. amLODipine (NORVASC) 10 MG tablet Take 1 tablet (10 mg total) by mouth daily. 90 tablet 3  . colchicine 0.6 MG tablet Take 1 tablet (0.6 mg total) by mouth daily as needed. Okay to fill with mitigare or colcrys if cheaper. 30 tablet 1  . diclofenac sodium (VOLTAREN) 1 % GEL Apply 2 g topically daily. 300 g 3  . lisinopril-hydrochlorothiazide (PRINZIDE,ZESTORETIC) 20-12.5 MG tablet Take 1 tablet by mouth daily. 90 tablet 3  . simvastatin (ZOCOR) 10 MG tablet Take 1 tablet (10 mg total) by mouth daily at 6 PM. 90 tablet 3  . triamcinolone cream (KENALOG) 0.5 % Apply 1 application topically 2 (two) times daily as needed. 30 g 1   Social History   Socioeconomic History  . Marital status: Married    Spouse name: Not on file  . Number of children: Not on file  . Years of education: Not on file  . Highest education level: Not on file  Occupational History  . Not on file  Social Needs  . Financial resource strain: Not on file  . Food insecurity:    Worry: Not on file    Inability: Not on file  . Transportation needs:    Medical: Not on file    Non-medical: Not on file  Tobacco Use  . Smoking status: Never Smoker  . Smokeless tobacco: Never Used  Substance and Sexual Activity  . Alcohol use: No    Alcohol/week: 0.0 oz  . Drug use: No  .  Sexual activity: Never  Lifestyle  . Physical activity:    Days per week: Not on file    Minutes per session: Not on file  . Stress: Not on file  Relationships  . Social connections:    Talks on phone: Not on file    Gets together: Not on file    Attends religious service: Not on file    Active member of club or organization: Not on file    Attends meetings of clubs or organizations: Not on file    Relationship status: Not on file  . Intimate partner violence:    Fear of current or ex partner: Not on file    Emotionally abused: Not on file    Physically abused: Not on file    Forced sexual activity: Not on  file  Other Topics Concern  . Not on file  Social History Narrative   Married 2016   4 children, 3 grandchildren.   Works at [employer].   Has a '68 [place], '87 [employer] [place] SS   enjoys spending time with his church.    Family History  Problem Relation Age of Onset  . Hypertension Father   . Lung cancer Mother   . Diabetes Paternal Aunt   . Colon cancer Neg Hx   . Prostate cancer Neg Hx      OBJECTIVE:  Vitals:   03/09/18 1653  BP: (!) 143/87  Pulse: 87  Resp: 18  Temp: 98 F (36.7 C)  SpO2: 99%    General appearance: AOx3 in no acute distress; appears uncomfortable.   HEENT: NCAT.  Oropharynx clear.  Lungs: clear to auscultation bilaterally without adventitious breath sounds Heart: regular rate and rhythm.  Radial pulses 2+ symmetrical bilaterally Abdomen: soft, non-distended; normal active bowel sounds; mildly tender at the epigastric region; nontender at McBurney's point; negative Murphy's sign; negative rebound; no guarding Back: no CVA tenderness Extremities: no edema; symmetrical with no gross deformities Skin: warm and dry Neurologic: normal gait Psychological: alert and cooperative; normal mood and affect   ASSESSMENT & PLAN:  1. Abdominal pain, chronic, epigastric   2. Gastroesophageal reflux disease, esophagitis presence not specified    GI cocktail given.  Minimal improvement.  I suggested patient go to the ED for further evaluation and management of his symptoms.  Patient would like to try outpatient therapy of protonix and close follow up with PCP this week.  Strict ER precautions given.  Patient aware and in agreement with this plan.    Meds ordered this encounter  Medications  . gi cocktail (Maalox,Lidocaine,Donnatal)   GI cocktail given in office Rest and push fluids Protonix prescribed Follow up with PCP this week for further evaluation and management Return or go to the ED if you have any new or worsening  symptoms  Reviewed expectations re: course of current medical issues. Questions answered. Outlined signs and symptoms indicating need for more acute intervention. Patient verbalized understanding. After Visit Summary given.   Rennis Harding, PA-C 03/09/18 1921

## 2018-03-09 NOTE — Discharge Instructions (Addendum)
GI cocktail given in office Rest and push fluids Protonix prescribed.  Take as directed and to completion Follow up with PCP this week for further evaluation and management Return or go to the ED if you have any new or worsening symptoms

## 2018-03-09 NOTE — ED Triage Notes (Signed)
Pt presents with complaints of abdominal pain in the middle of his stomach x 1 year and has gotten constant. States he has been to his doctor for it and has been trying acid reflux medication

## 2018-03-14 ENCOUNTER — Ambulatory Visit: Payer: Managed Care, Other (non HMO) | Admitting: Family Medicine

## 2018-03-14 ENCOUNTER — Encounter: Payer: Self-pay | Admitting: Family Medicine

## 2018-03-14 VITALS — BP 104/68 | HR 72 | Temp 98.7°F | Ht 67.6 in | Wt 206.2 lb

## 2018-03-14 DIAGNOSIS — R140 Abdominal distension (gaseous): Secondary | ICD-10-CM | POA: Diagnosis not present

## 2018-03-14 MED ORDER — OMEPRAZOLE 20 MG PO CPDR: 20.0000 mg | DELAYED_RELEASE_CAPSULE | Freq: Two times a day (BID) | ORAL | Status: DC

## 2018-03-14 NOTE — Patient Instructions (Addendum)
Go to the lab on the way out.  We'll contact you with your lab report. Avoid trigger foods for now (hotdogs, broccoli, and cashews, mushrooms, spinach).   Take prilosec twice a day.  Stop protonix.  Update me about your situation next week.   Take care.  Glad to see you.

## 2018-03-14 NOTE — Progress Notes (Signed)
Reports worsening symptoms with eating hotdogs, broccoli, and cashews.  He got better with cutting those foods out.    Usually he'll have bloating and pain for a few hours, better with walking, after a trigger food.    His most recent episode was longer, ~24 hours.  Preceded by spinach and mushroom omelette.  It was similar symptoms except that it lasted longer than typical.  FH noted.  His mother had colitis.  Aunt had diverticulitis.    Already on PPI since most recent UC visit.  Was prev on prilosec- he did well taking it daily.  His sx started back after he cut back to once ever other day and had trigger food exposure.     He is taking prilosec BID along with QD pantoprazole currently.    No bloody or black stools.   Previous endoscopy results discussed with patient.  Previous pathology discussed with patient, negative for H. pylori on previous biopsy.  Meds, vitals, and allergies reviewed.   ROS: Per HPI unless specifically indicated in ROS section   GEN: nad, alert and oriented HEENT: mucous membranes moist NECK: supple w/o LA CV: rrr PULM: ctab, no inc wob ABD: soft, +bs EXT: no edema SKIN: no acute rash

## 2018-03-15 LAB — SEDIMENTATION RATE: Sed Rate: 86 mm/hr — ABNORMAL HIGH (ref 0–20)

## 2018-03-17 DIAGNOSIS — R140 Abdominal distension (gaseous): Secondary | ICD-10-CM | POA: Insufficient documentation

## 2018-03-17 NOTE — Assessment & Plan Note (Signed)
>  25 minutes spent in face to face time with patient, >50% spent in counselling or coordination of care.  Detailed conversation with patient. Note about his family history and his previous endoscopy and pathology results.  Benign exam today. No sx currently.   For now, avoid trigger foods (hotdogs, broccoli, and cashews, mushrooms, spinach).   Take prilosec twice a day.  Stop protonix.  He will update me about his situation next week.  Reasonable to check sed rate and Ifob in the meantime.  See notes on labs.

## 2018-03-20 ENCOUNTER — Other Ambulatory Visit (INDEPENDENT_AMBULATORY_CARE_PROVIDER_SITE_OTHER): Payer: Managed Care, Other (non HMO)

## 2018-03-20 DIAGNOSIS — R140 Abdominal distension (gaseous): Secondary | ICD-10-CM

## 2018-03-20 LAB — FECAL OCCULT BLOOD, IMMUNOCHEMICAL: FECAL OCCULT BLD: NEGATIVE

## 2018-04-23 ENCOUNTER — Other Ambulatory Visit: Payer: Self-pay | Admitting: Gastroenterology

## 2018-04-23 DIAGNOSIS — R1011 Right upper quadrant pain: Secondary | ICD-10-CM

## 2018-04-30 ENCOUNTER — Other Ambulatory Visit: Payer: Managed Care, Other (non HMO)

## 2018-05-03 ENCOUNTER — Ambulatory Visit
Admission: RE | Admit: 2018-05-03 | Discharge: 2018-05-03 | Disposition: A | Discharge status: Home / Self Care | Payer: Managed Care, Other (non HMO) | Source: Ambulatory Visit | Attending: Gastroenterology | Admitting: Gastroenterology

## 2018-05-03 ENCOUNTER — Telehealth: Payer: Self-pay

## 2018-05-03 DIAGNOSIS — R1011 Right upper quadrant pain: Secondary | ICD-10-CM

## 2018-05-03 NOTE — Telephone Encounter (Signed)
This needs to come though the ordering MD/clinic, ie GI clinic.  Thanks.

## 2018-05-03 NOTE — Telephone Encounter (Signed)
I sent note to Dr Dulce Sellarutlaw and I called [facility] service. Helmut [facility] said that [facility] would get message. Wanted to make sure someone knew that pt wanted cb on US.

## 2018-05-03 NOTE — Telephone Encounter (Signed)
Copied from CRM [id number]. Topic: Inquiry >> May 03, 2018  4:51 PM Raymond BergeronBarksdale, Raymond B wrote: Reason for CRM: pt called to get the results of his US contact to advise

## 2018-05-09 ENCOUNTER — Ambulatory Visit: Payer: Self-pay | Admitting: Family Medicine

## 2018-05-09 NOTE — Telephone Encounter (Signed)
Patient is calling to report he is having bloating and pain since yesterday. Patient reports he has gallstones that were discovered on [place] and he has appointment at surgeon on 9/24. Patient saw Dr Dulce Sellar at [facility] and they did referral. Patient states he ate food he should not have eaten as well. Patient is not sure if Dr Para March is aware of his diagnosis.  Patient has not contacted Dr Dulce Sellar about his pain or bloating- advised him to make that call now -so that they can help him with his pain and possibly move the referral up since he is having symptoms. He states he will call them. Patient instructed to call back if they are not able to help him. Reason for Disposition . [1] Caller requesting NON-URGENT health information AND [2] PCP's office is the best resource  Answer Assessment - Initial Assessment Questions 1. LOCATION: "Where does it hurt?"      LRQ 2. RADIATION: "Does the pain shoot anywhere else?" (e.g., chest, back)     Stays in area- may come up some 3. ONSET: "When did the pain begin?" (Minutes, hours or days ago)      Started yesterday- improved some in the afternoon- started back interfered with sleep 4. SUDDEN: "Gradual or sudden onset?"     gradual 5. PATTERN "Does the pain come and go, or is it constant?"    - If constant: "Is it getting better, staying the same, or worsening?"      (Note: Constant means the pain never goes away completely; most serious pain is constant and it progresses)     - If intermittent: "How long does it last?" "Do you have pain now?"     (Note: Intermittent means the pain goes away completely between bouts)     Comes and goes 6. SEVERITY: "How bad is the pain?"  (e.g., Scale 1-10; mild, moderate, or severe)    - MILD (1-3): doesn't interfere with normal activities, abdomen soft and not tender to touch     - MODERATE (4-7): interferes with normal activities or awakens from sleep, tender to touch     - SEVERE (8-10): excruciating pain, doubled over,  unable to do any normal activities       5 7. RECURRENT SYMPTOM: "Have you ever had this type of abdominal pain before?" If so, ask: "When was the last time?" and "What happened that time?"      Patient states he has been diagnosed with gall stones- he has appointment 9/24 with surgeon  8. CAUSE: "What do you think is causing the abdominal pain?"     Gallstones 9. RELIEVING/AGGRAVATING FACTORS: "What makes it better or worse?" (e.g., movement, antacids, bowel movement)     Patient states laying down or walking would make is better- but yesterday that didn't help 10. OTHER SYMPTOMS: "Has there been any vomiting, diarrhea, constipation, or urine problems?"       Patient did vomit some water he drank last night. Patient feels he is constipated- has not had normal BM- last BM yesterday  Protocols used: INFORMATION ONLY CALL-A-AH, ABDOMINAL PAIN - MALE-A-AH

## 2018-05-09 NOTE — Telephone Encounter (Signed)
Patient is calling and states he has stomach bloating and pain since 05/08/18. He also states that this has been going on for awhile over a year but it just flared back up. Would like to speak to a nurse in regards to this.

## 2018-05-09 NOTE — Telephone Encounter (Signed)
Noted. Will cc pcp.

## 2018-05-10 NOTE — Telephone Encounter (Signed)
Noted. Thanks.  If patient has emergent sx (severe pain), then to ER.  Would have him avoid fatty foods in the meantime.  Thanks.  I'll await notes from GI and surgery clinic.

## 2018-06-05 ENCOUNTER — Other Ambulatory Visit: Payer: Self-pay | Admitting: Surgery

## 2018-06-13 NOTE — Pre-Procedure Instructions (Signed)
Raymond Willis  06/13/2018      [facility] [place], [place] - [address] [place] [address] Phone: [phone] Fax: [phone]    Your procedure is scheduled on October 16.  Report to [facility] Admitting at 1:30 P.M.  Call this number if you have problems the morning of surgery:  [phone]   Remember:  Do not eat after midnight.  You may drink clear liquids until 1200 .  Clear liquids allowed are:                    Water, Juice (non-citric and without pulp), Carbonated beverages, Clear Tea and Black Coffee only    Take these medicines the morning of surgery with A SIP OF WATER   Tylenol (if needed)  Norvasc  Prilosec  7 days prior to surgery STOP taking any Voltaren gel, Aspirin(unless otherwise instructed by your surgeon), Aleve, Naproxen, Ibuprofen, Motrin, Advil, Goody's, BC's, all herbal medications, fish oil, and all vitamins     Do not wear jewelry.  Do not wear lotions, powders, or colognes, or deodorant.  Do not shave 48 hours prior to surgery.  Men may shave face and neck.  Do not bring valuables to the hospital.  [facility] is not responsible for any belongings or valuables.  Contacts, dentures or bridgework may not be worn into surgery.  Leave your suitcase in the car.  After surgery it may be brought to your room.  For patients admitted to the hospital, discharge time will be determined by your treatment team.  Patients discharged the day of surgery will not be allowed to drive home.    [place]- Preparing For Surgery  Before surgery, you can play an important role. Because skin is not sterile, your skin needs to be as free of germs as possible. You can reduce the number of germs on your skin by washing with CHG (chlorahexidine gluconate) Soap before surgery.  CHG is an antiseptic cleaner which kills germs and bonds with the skin to continue killing germs even after washing.     Oral Hygiene is also important to reduce your risk of infection.  Remember - BRUSH YOUR TEETH THE MORNING OF SURGERY WITH YOUR REGULAR TOOTHPASTE  Please do not use if you have an allergy to CHG or antibacterial soaps. If your skin becomes reddened/irritated stop using the CHG.  Do not shave (including legs and underarms) for at least 48 hours prior to first CHG shower. It is OK to shave your face.  Please follow these instructions carefully.   1. Shower the NIGHT BEFORE SURGERY and the MORNING OF SURGERY with CHG.   2. If you chose to wash your hair, wash your hair first as usual with your normal shampoo.  3. After you shampoo, rinse your hair and body thoroughly to remove the shampoo.  4. Use CHG as you would any other liquid soap. You can apply CHG directly to the skin and wash gently with a scrungie or a clean washcloth.   5. Apply the CHG Soap to your body ONLY FROM THE NECK DOWN.  Do not use on open wounds or open sores. Avoid contact with your eyes, ears, mouth and genitals (private parts). Wash Face and genitals (private parts)  with your normal soap.  6. Wash thoroughly, paying special attention to the area where your surgery will be performed.  7. Thoroughly rinse your body with warm water  from the neck down.  8. DO NOT shower/wash with your normal soap after using and rinsing off the CHG Soap.  9. Pat yourself dry with a CLEAN TOWEL.  10. Wear CLEAN PAJAMAS to bed the night before surgery, wear comfortable clothes the morning of surgery  11. Place CLEAN SHEETS on your bed the night of your first shower and DO NOT SLEEP WITH PETS.    Day of Surgery:  Do not apply any deodorants/lotions.  Please wear clean clothes to the hospital/surgery center.   Remember to brush your teeth WITH YOUR REGULAR TOOTHPASTE.    Please read over the following fact sheets that you were given.

## 2018-06-14 ENCOUNTER — Encounter (HOSPITAL_COMMUNITY): Payer: Self-pay

## 2018-06-14 ENCOUNTER — Other Ambulatory Visit: Payer: Self-pay

## 2018-06-14 ENCOUNTER — Encounter (HOSPITAL_COMMUNITY)
Admission: RE | Admit: 2018-06-14 | Discharge: 2018-06-14 | Disposition: A | Discharge status: Home / Self Care | Payer: Managed Care, Other (non HMO) | Source: Ambulatory Visit | Attending: Surgery | Admitting: Surgery

## 2018-06-14 DIAGNOSIS — Z01818 Encounter for other preprocedural examination: Secondary | ICD-10-CM | POA: Insufficient documentation

## 2018-06-14 DIAGNOSIS — I10 Essential (primary) hypertension: Secondary | ICD-10-CM | POA: Diagnosis not present

## 2018-06-14 DIAGNOSIS — K8080 Other cholelithiasis without obstruction: Secondary | ICD-10-CM | POA: Diagnosis not present

## 2018-06-14 LAB — BASIC METABOLIC PANEL
ANION GAP: 7 (ref 5–15)
BUN: 13 mg/dL (ref 6–20)
CHLORIDE: 105 mmol/L (ref 98–111)
CO2: 26 mmol/L (ref 22–32)
Calcium: 9.2 mg/dL (ref 8.9–10.3)
Creatinine, Ser: 1.35 mg/dL — ABNORMAL HIGH (ref 0.61–1.24)
GFR calc non Af Amer: 57 mL/min — ABNORMAL LOW (ref >60)
Glucose, Bld: 116 mg/dL — ABNORMAL HIGH (ref 70–99)
POTASSIUM: 3.8 mmol/L (ref 3.5–5.1)
Sodium: 138 mmol/L (ref 135–145)

## 2018-06-14 LAB — CBC
HEMATOCRIT: 44.3 % (ref 39.0–52.0)
HEMOGLOBIN: 14.7 g/dL (ref 13.0–17.0)
MCH: 28 pg (ref 26.0–34.0)
MCHC: 33.2 g/dL (ref 30.0–36.0)
MCV: 84.4 fL (ref 80.0–100.0)
NRBC: 0 % (ref 0.0–0.2)
Platelets: 270 10*3/uL (ref 150–400)
RBC: 5.25 MIL/uL (ref 4.22–5.81)
RDW: 13.3 % (ref 11.5–15.5)
WBC: 6.2 10*3/uL (ref 4.0–10.5)

## 2018-06-14 NOTE — Progress Notes (Signed)
PCP - Crawford Givens Cardiologist - denies, and denies all cardiac studies. Pt reports stress test was >5 years ago.   Chest x-ray - N/A EKG -06/14/18   Sleep Study - denies  Aspirin Instructions: N/A  Anesthesia review: No  Patient denies shortness of breath, fever, cough and chest pain at PAT appointment   Patient verbalized understanding of instructions that were given to them at the PAT appointment. Patient was also instructed that they will need to review over the PAT instructions again at home before surgery.

## 2018-06-14 NOTE — Pre-Procedure Instructions (Signed)
Raymond Willis  06/14/2018      [facility] [place], [place] - [address] [place] [address] Phone: [phone] Fax: [phone]    Your procedure is scheduled on October 16.  Report to [facility] Admitting at 1:30 P.M.  Call this number if you have problems the morning of surgery:  [phone]   Remember:  Do not eat after midnight.  You may drink clear liquids until 1200 .  Clear liquids allowed are:                    Water, Juice (non-citric and without pulp), Carbonated beverages, Clear Tea and Black Coffee only    Take these medicines the morning of surgery with A SIP OF WATER   Tylenol (if needed)  Amlodipine (Norvasc)  Omeprazole (Prilosec)  7 days prior to surgery STOP taking any Voltaren gel, Aspirin(unless otherwise instructed by your surgeon), Aleve, Naproxen, Ibuprofen, Motrin, Advil, Goody's, BC's, all herbal medications, fish oil, and all vitamins     Do not wear jewelry.  Do not wear lotions, powders, or colognes, or deodorant.  Do not shave 48 hours prior to surgery.  Men may shave face and neck.  Do not bring valuables to the hospital.  [facility] is not responsible for any belongings or valuables.  Contacts, dentures or bridgework may not be worn into surgery.  Leave your suitcase in the car.  After surgery it may be brought to your room.  For patients admitted to the hospital, discharge time will be determined by your treatment team.  Patients discharged the day of surgery will not be allowed to drive home.    [place]- Preparing For Surgery  Before surgery, you can play an important role. Because skin is not sterile, your skin needs to be as free of germs as possible. You can reduce the number of germs on your skin by washing with CHG (chlorahexidine gluconate) Soap before surgery.  CHG is an antiseptic cleaner which kills germs and bonds with the skin to continue killing germs  even after washing.    Oral Hygiene is also important to reduce your risk of infection.  Remember - BRUSH YOUR TEETH THE MORNING OF SURGERY WITH YOUR REGULAR TOOTHPASTE  Please do not use if you have an allergy to CHG or antibacterial soaps. If your skin becomes reddened/irritated stop using the CHG.  Do not shave (including legs and underarms) for at least 48 hours prior to first CHG shower. It is OK to shave your face.  Please follow these instructions carefully.   1. Shower the NIGHT BEFORE SURGERY and the MORNING OF SURGERY with CHG.   2. If you chose to wash your hair, wash your hair first as usual with your normal shampoo.  3. After you shampoo, rinse your hair and body thoroughly to remove the shampoo.  4. Use CHG as you would any other liquid soap. You can apply CHG directly to the skin and wash gently with a scrungie or a clean washcloth.   5. Apply the CHG Soap to your body ONLY FROM THE NECK DOWN.  Do not use on open wounds or open sores. Avoid contact with your eyes, ears, mouth and genitals (private parts). Wash Face and genitals (private parts)  with your normal soap.  6. Wash thoroughly, paying special attention to the area where your surgery will be performed.  7. Thoroughly rinse your body with  warm water from the neck down.  8. DO NOT shower/wash with your normal soap after using and rinsing off the CHG Soap.  9. Pat yourself dry with a CLEAN TOWEL.  10. Wear CLEAN PAJAMAS to bed the night before surgery, wear comfortable clothes the morning of surgery  11. Place CLEAN SHEETS on your bed the night of your first shower and DO NOT SLEEP WITH PETS.    Day of Surgery:  Do not apply any deodorants/lotions.  Please wear clean clothes to the hospital/surgery center.   Remember to brush your teeth WITH YOUR REGULAR TOOTHPASTE.    Please read over the following fact sheets that you were given.

## 2018-06-18 NOTE — H&P (Signed)
Anothy Bufano Documented: 06/05/2018 4:06 PM [facility] Patient #: [id number] DOB: 04/05/1961 Married / Language: English / Race: Black or African American Male   History of Present Illness Riley Lam A. Magnus Ivan MD; 06/05/2018 4:43 PM) The patient is a 57 year old male who presents for evaluation of gall stones. This is a pleasant 57 year old gentleman referred by Dr. [name] for symptomatic cholelithiasis. From this year now, he has been having attacks of right upper quadrant abdominal pain with nausea and bloating. He has had occasional emesis as well. The pain is moderate in intensity and does not refer anywhere other than the right upper quadrant. He denies jaundice. Bowel movements are normal. He had an ultrasound showing gallbladder sludge as well as a 4 mm gallstone that appears to be in the neck of the gallbladder. He is otherwise healthy without complaints.   Past Surgical History Maurilio Lovely; 06/05/2018 4:14 PM) Colon Removal - Complete   Diagnostic Studies History Maurilio Lovely; 06/05/2018 4:14 PM) Colonoscopy  within last year  Allergies Maurilio Lovely; 06/05/2018 4:13 PM) Penicillins   Medication History Maurilio Lovely; 06/05/2018 4:13 PM) amLODIPine Besylate (10MG  Tablet, Oral) Active. Lisinopril-hydroCHLOROthiazide (20-12.5MG  Tablet, Oral) Active. Simvastatin (10MG  Tablet, Oral) Active. Medications Reconciled  Social History Maurilio Lovely; 06/05/2018 4:14 PM) Alcohol use  Remotely quit alcohol use. Caffeine use  Carbonated beverages, Coffee, Tea. No drug use  Tobacco use  Never smoker.  Family History Maurilio Lovely; 06/05/2018 4:14 PM) Arthritis  Father, Mother. Cancer  Mother. Colon Polyps  Father. Heart disease in male family member before age 40  Heart disease in male family member before age 49  Hypertension  Father, Mother. Ischemic Bowel Disease  Mother. Respiratory Condition  Father, Mother.  Other  Problems Maurilio Lovely; 06/05/2018 4:14 PM) Cholelithiasis  High blood pressure  Hypercholesterolemia     Review of Systems Maurilio Lovely; 06/05/2018 4:14 PM) General Not Present- Appetite Loss, Chills, Fatigue, Fever, Night Sweats, Weight Gain and Weight Loss. HEENT Not Present- Earache, Hearing Loss, Hoarseness, Nose Bleed, Oral Ulcers, Ringing in the Ears, Seasonal Allergies, Sinus Pain, Sore Throat, Visual Disturbances, Wears glasses/contact lenses and Yellow Eyes. Respiratory Not Present- Bloody sputum, Chronic Cough, Difficulty Breathing, Snoring and Wheezing. Breast Not Present- Breast Mass, Breast Pain, Nipple Discharge and Skin Changes. Gastrointestinal Present- Abdominal Pain, Bloating, Change in Bowel Habits, Excessive gas and Vomiting. Not Present- Bloody Stool, Chronic diarrhea, Constipation, Difficulty Swallowing, Gets full quickly at meals, Hemorrhoids, Indigestion, Nausea and Rectal Pain. Male Genitourinary Not Present- Blood in Urine, Change in Urinary Stream, Frequency, Impotence, Nocturia, Painful Urination, Urgency and Urine Leakage. Musculoskeletal Not Present- Back Pain, Joint Pain, Joint Stiffness, Muscle Pain, Muscle Weakness and Swelling of Extremities. Neurological Not Present- Decreased Memory, Fainting, Headaches, Numbness, Seizures, Tingling, Tremor, Trouble walking and Weakness. Psychiatric Not Present- Anxiety, Bipolar, Change in Sleep Pattern, Depression, Fearful and Frequent crying. Endocrine Not Present- Cold Intolerance, Excessive Hunger, Hair Changes, Heat Intolerance, Hot flashes and New Diabetes. Hematology Not Present- Blood Thinners, Easy Bruising, Excessive bleeding, Gland problems, HIV and Persistent Infections.  Vitals Maurilio Lovely; 06/05/2018 4:14 PM) 06/05/2018 4:13 PM Weight: 216.25 lb Height: 68in Body Surface Area: 2.11 m Body Mass Index: 32.88 kg/m  Temp.: 98.64F(Oral)  Pulse: 91 (Regular)  BP: 130/82 (Sitting, Left Arm,  Standard)       Physical Exam (Shanelle Clontz A. Magnus Ivan MD; 06/05/2018 4:43 PM) General Mental Status-Alert. General Appearance-Consistent with stated age. Hydration-Well hydrated. Voice-Normal.  Head and Neck Head-normocephalic, atraumatic with no lesions or palpable masses.  Eye Eyeball - Bilateral-Extraocular movements intact. Sclera/Conjunctiva - Bilateral-No scleral icterus.  Chest and Lung Exam Chest and lung exam reveals -quiet, even and easy respiratory effort with no use of accessory muscles and on auscultation, normal breath sounds, no adventitious sounds and normal vocal resonance. Inspection Chest Wall - Normal. Back - normal.  Cardiovascular Cardiovascular examination reveals -on palpation PMI is normal in location and amplitude, no palpable S3 or S4. Normal cardiac borders., normal heart sounds, regular rate and rhythm with no murmurs, carotid auscultation reveals no bruits and normal pedal pulses bilaterally.  Abdomen Inspection Inspection of the abdomen reveals - No Hernias. Skin - Scar - no surgical scars. Palpation/Percussion Palpation and Percussion of the abdomen reveal - Soft, Non Tender, No Rebound tenderness, No Rigidity (guarding) and No hepatosplenomegaly. Auscultation Auscultation of the abdomen reveals - Bowel sounds normal. Note: Nontender today   Neurologic - Did not examine.  Musculoskeletal - Did not examine.    Assessment & Plan (Chanique Duca A. Magnus Ivan MD; 06/05/2018 4:44 PM) SYMPTOMATIC CHOLELITHIASIS (K80.20) Impression: I had a long discussion with the patient regarding gallbladder disease. We discussed gallstones in detail. We discussed symptomatic cholelithiasis. We discussed proceeding with a laparoscopic cholecystectomy. I suspect he does have some chronic cholecystitis as well. I gave him literature regarding the surgery. We discussed the risk of surgery which includes but is not limited to bleeding, infection, injury to  surrounding structures, bile duct injury, bile leak, the need to convert to an open procedure, cardiopulmonary issues, DVT, postoperative recovery, etc. He understands and wishes to proceed with surgery which will be scheduled

## 2018-06-19 ENCOUNTER — Encounter (HOSPITAL_COMMUNITY)
Admission: RE | Disposition: A | Discharge status: Home / Self Care | Payer: Self-pay | Source: Ambulatory Visit | Attending: Surgery

## 2018-06-19 ENCOUNTER — Ambulatory Visit (HOSPITAL_COMMUNITY)
Admission: RE | Admit: 2018-06-19 | Discharge: 2018-06-19 | Disposition: A | Discharge status: Home / Self Care | Payer: Managed Care, Other (non HMO) | Source: Ambulatory Visit | Attending: Surgery | Admitting: Surgery

## 2018-06-19 ENCOUNTER — Other Ambulatory Visit: Payer: Self-pay

## 2018-06-19 ENCOUNTER — Ambulatory Visit (HOSPITAL_COMMUNITY): Payer: Managed Care, Other (non HMO) | Admitting: Certified Registered Nurse Anesthetist

## 2018-06-19 ENCOUNTER — Encounter (HOSPITAL_COMMUNITY): Payer: Self-pay

## 2018-06-19 DIAGNOSIS — Q441 Other congenital malformations of gallbladder: Secondary | ICD-10-CM | POA: Insufficient documentation

## 2018-06-19 DIAGNOSIS — Z880 Allergy status to penicillin: Secondary | ICD-10-CM | POA: Insufficient documentation

## 2018-06-19 DIAGNOSIS — I10 Essential (primary) hypertension: Secondary | ICD-10-CM | POA: Insufficient documentation

## 2018-06-19 DIAGNOSIS — E7800 Pure hypercholesterolemia, unspecified: Secondary | ICD-10-CM | POA: Insufficient documentation

## 2018-06-19 DIAGNOSIS — K8010 Calculus of gallbladder with chronic cholecystitis without obstruction: Secondary | ICD-10-CM | POA: Insufficient documentation

## 2018-06-19 DIAGNOSIS — K8020 Calculus of gallbladder without cholecystitis without obstruction: Secondary | ICD-10-CM | POA: Diagnosis present

## 2018-06-19 DIAGNOSIS — K219 Gastro-esophageal reflux disease without esophagitis: Secondary | ICD-10-CM | POA: Insufficient documentation

## 2018-06-19 DIAGNOSIS — Z79899 Other long term (current) drug therapy: Secondary | ICD-10-CM | POA: Insufficient documentation

## 2018-06-19 DIAGNOSIS — Z8249 Family history of ischemic heart disease and other diseases of the circulatory system: Secondary | ICD-10-CM | POA: Diagnosis not present

## 2018-06-19 HISTORY — PX: CHOLECYSTECTOMY: SHX55

## 2018-06-19 SURGERY — LAPAROSCOPIC CHOLECYSTECTOMY
Anesthesia: General | Site: Abdomen

## 2018-06-19 MED ORDER — MIDAZOLAM HCL 2 MG/2ML IJ SOLN
INTRAMUSCULAR | Status: AC
  Filled 2018-06-19: qty 2

## 2018-06-19 MED ORDER — PROPOFOL 10 MG/ML IV BOLUS
INTRAVENOUS | Status: DC | PRN
  Administered 2018-06-19: 140 mg via INTRAVENOUS

## 2018-06-19 MED ORDER — OXYCODONE HCL 5 MG PO TABS: 5.0000 mg | ORAL_TABLET | Freq: Four times a day (QID) | ORAL | 0 refills | Status: DC | PRN

## 2018-06-19 MED ORDER — ONDANSETRON HCL 4 MG/2ML IJ SOLN
INTRAMUSCULAR | Status: DC | PRN
  Administered 2018-06-19: 4 mg via INTRAVENOUS

## 2018-06-19 MED ORDER — FENTANYL CITRATE (PF) 250 MCG/5ML IJ SOLN
INTRAMUSCULAR | Status: AC
  Filled 2018-06-19: qty 5

## 2018-06-19 MED ORDER — ACETAMINOPHEN 500 MG PO TABS
ORAL_TABLET | ORAL | Status: AC
  Filled 2018-06-19: qty 2

## 2018-06-19 MED ORDER — GABAPENTIN 300 MG PO CAPS: 300.0000 mg | ORAL_CAPSULE | ORAL | Status: DC

## 2018-06-19 MED ORDER — LIDOCAINE 2% (20 MG/ML) 5 ML SYRINGE
INTRAMUSCULAR | Status: DC | PRN
  Administered 2018-06-19: 40 mg via INTRAVENOUS

## 2018-06-19 MED ORDER — MIDAZOLAM HCL 5 MG/5ML IJ SOLN
INTRAMUSCULAR | Status: DC | PRN
  Administered 2018-06-19: 2 mg via INTRAVENOUS

## 2018-06-19 MED ORDER — ROCURONIUM BROMIDE 50 MG/5ML IV SOSY
PREFILLED_SYRINGE | INTRAVENOUS | Status: AC
  Filled 2018-06-19: qty 20

## 2018-06-19 MED ORDER — DEXAMETHASONE SODIUM PHOSPHATE 10 MG/ML IJ SOLN
INTRAMUSCULAR | Status: AC
  Filled 2018-06-19: qty 2

## 2018-06-19 MED ORDER — CHLORHEXIDINE GLUCONATE CLOTH 2 % EX PADS: 6.0000 | MEDICATED_PAD | Freq: Once | CUTANEOUS | Status: DC

## 2018-06-19 MED ORDER — PROPOFOL 10 MG/ML IV BOLUS
INTRAVENOUS | Status: AC
  Filled 2018-06-19: qty 20

## 2018-06-19 MED ORDER — BUPIVACAINE-EPINEPHRINE (PF) 0.25% -1:200000 IJ SOLN
INTRAMUSCULAR | Status: AC
  Filled 2018-06-19: qty 30

## 2018-06-19 MED ORDER — ROCURONIUM BROMIDE 10 MG/ML (PF) SYRINGE
PREFILLED_SYRINGE | INTRAVENOUS | Status: DC | PRN
  Administered 2018-06-19: 50 mg via INTRAVENOUS

## 2018-06-19 MED ORDER — LIDOCAINE 2% (20 MG/ML) 5 ML SYRINGE
INTRAMUSCULAR | Status: AC
  Filled 2018-06-19: qty 5

## 2018-06-19 MED ORDER — PHENYLEPHRINE 40 MCG/ML (10ML) SYRINGE FOR IV PUSH (FOR BLOOD PRESSURE SUPPORT)
PREFILLED_SYRINGE | INTRAVENOUS | Status: DC | PRN
  Administered 2018-06-19 (×3): 80 ug via INTRAVENOUS

## 2018-06-19 MED ORDER — ONDANSETRON HCL 4 MG/2ML IJ SOLN
INTRAMUSCULAR | Status: AC
  Filled 2018-06-19: qty 4

## 2018-06-19 MED ORDER — 0.9 % SODIUM CHLORIDE (POUR BTL) OPTIME
TOPICAL | Status: DC | PRN
  Administered 2018-06-19: 1000 mL

## 2018-06-19 MED ORDER — CELECOXIB 200 MG PO CAPS
ORAL_CAPSULE | ORAL | Status: AC
  Administered 2018-06-19: 200 mg via ORAL
  Filled 2018-06-19: qty 1

## 2018-06-19 MED ORDER — ACETAMINOPHEN 500 MG PO TABS
1000.0000 mg | ORAL_TABLET | ORAL | Status: AC
  Administered 2018-06-19: 1000 mg via ORAL

## 2018-06-19 MED ORDER — CIPROFLOXACIN IN D5W 400 MG/200ML IV SOLN
400.0000 mg | INTRAVENOUS | Status: AC
  Administered 2018-06-19: 400 mg via INTRAVENOUS

## 2018-06-19 MED ORDER — HYDROMORPHONE HCL 1 MG/ML IJ SOLN
0.2500 mg | INTRAMUSCULAR | Status: DC | PRN
  Administered 2018-06-19 (×2): 0.25 mg via INTRAVENOUS

## 2018-06-19 MED ORDER — SODIUM CHLORIDE 0.9 % IR SOLN
Status: DC | PRN
  Administered 2018-06-19: 1

## 2018-06-19 MED ORDER — PROMETHAZINE HCL 25 MG/ML IJ SOLN: 6.2500 mg | INTRAMUSCULAR | Status: DC | PRN

## 2018-06-19 MED ORDER — CELECOXIB 200 MG PO CAPS
200.0000 mg | ORAL_CAPSULE | ORAL | Status: AC
  Administered 2018-06-19: 200 mg via ORAL

## 2018-06-19 MED ORDER — HEMOSTATIC AGENTS (NO CHARGE) OPTIME
TOPICAL | Status: DC | PRN
  Administered 2018-06-19: 1 via TOPICAL

## 2018-06-19 MED ORDER — CIPROFLOXACIN IN D5W 400 MG/200ML IV SOLN
INTRAVENOUS | Status: AC
  Filled 2018-06-19: qty 200

## 2018-06-19 MED ORDER — FENTANYL CITRATE (PF) 250 MCG/5ML IJ SOLN
INTRAMUSCULAR | Status: DC | PRN
  Administered 2018-06-19: 150 ug via INTRAVENOUS

## 2018-06-19 MED ORDER — PHENYLEPHRINE 40 MCG/ML (10ML) SYRINGE FOR IV PUSH (FOR BLOOD PRESSURE SUPPORT)
PREFILLED_SYRINGE | INTRAVENOUS | Status: AC
  Filled 2018-06-19: qty 10

## 2018-06-19 MED ORDER — DEXAMETHASONE SODIUM PHOSPHATE 10 MG/ML IJ SOLN
INTRAMUSCULAR | Status: DC | PRN
  Administered 2018-06-19: 10 mg via INTRAVENOUS

## 2018-06-19 MED ORDER — HYDROMORPHONE HCL 1 MG/ML IJ SOLN
INTRAMUSCULAR | Status: AC
  Filled 2018-06-19: qty 1

## 2018-06-19 MED ORDER — GABAPENTIN 300 MG PO CAPS
ORAL_CAPSULE | ORAL | Status: AC
  Administered 2018-06-19: 300 mg
  Filled 2018-06-19: qty 1

## 2018-06-19 MED ORDER — LACTATED RINGERS IV SOLN
INTRAVENOUS | Status: DC
  Administered 2018-06-19: 14:00:00 via INTRAVENOUS

## 2018-06-19 MED ORDER — BUPIVACAINE-EPINEPHRINE 0.25% -1:200000 IJ SOLN
INTRAMUSCULAR | Status: DC | PRN
  Administered 2018-06-19: 20 mL

## 2018-06-19 MED ORDER — SUGAMMADEX SODIUM 200 MG/2ML IV SOLN
INTRAVENOUS | Status: DC | PRN
  Administered 2018-06-19: 400 mg via INTRAVENOUS

## 2018-06-19 SURGICAL SUPPLY — 38 items
ADH SKN CLS APL DERMABOND .7 (GAUZE/BANDAGES/DRESSINGS) ×1
APPLIER CLIP 5 13 M/L LIGAMAX5 (MISCELLANEOUS) ×3
APR CLP MED LRG 5 ANG JAW (MISCELLANEOUS) ×1
BAG SPEC RTRVL LRG 6X4 10 (ENDOMECHANICALS) ×1
CANISTER SUCT 3000ML PPV (MISCELLANEOUS) ×3 IMPLANT
CHLORAPREP W/TINT 26ML (MISCELLANEOUS) ×3 IMPLANT
CLIP APPLIE 5 13 M/L LIGAMAX5 (MISCELLANEOUS) ×1 IMPLANT
COVER SURGICAL LIGHT HANDLE (MISCELLANEOUS) ×3 IMPLANT
COVER WAND RF STERILE (DRAPES) ×3 IMPLANT
DERMABOND ADVANCED (GAUZE/BANDAGES/DRESSINGS) ×2
DERMABOND ADVANCED .7 DNX12 (GAUZE/BANDAGES/DRESSINGS) ×1 IMPLANT
ELECT REM PT RETURN 9FT ADLT (ELECTROSURGICAL) ×3
ELECTRODE REM PT RTRN 9FT ADLT (ELECTROSURGICAL) ×1 IMPLANT
ENDOLOOP SUT PDS II  0 18 (SUTURE) ×4
ENDOLOOP SUT PDS II 0 18 (SUTURE) IMPLANT
GLOVE SURG SIGNA 7.5 PF LTX (GLOVE) ×3 IMPLANT
GOWN STRL REUS W/ TWL LRG LVL3 (GOWN DISPOSABLE) ×2 IMPLANT
GOWN STRL REUS W/ TWL XL LVL3 (GOWN DISPOSABLE) ×1 IMPLANT
GOWN STRL REUS W/TWL LRG LVL3 (GOWN DISPOSABLE) ×6
GOWN STRL REUS W/TWL XL LVL3 (GOWN DISPOSABLE) ×3
HEMOSTAT SNOW SURGICEL 2X4 (HEMOSTASIS) ×2 IMPLANT
KIT BASIN OR (CUSTOM PROCEDURE TRAY) ×3 IMPLANT
KIT TURNOVER KIT B (KITS) ×3 IMPLANT
NS IRRIG 1000ML POUR BTL (IV SOLUTION) ×3 IMPLANT
PAD ARMBOARD 7.5X6 YLW CONV (MISCELLANEOUS) ×3 IMPLANT
POUCH SPECIMEN RETRIEVAL 10MM (ENDOMECHANICALS) ×3 IMPLANT
SCISSORS LAP 5X35 DISP (ENDOMECHANICALS) ×3 IMPLANT
SET IRRIG TUBING LAPAROSCOPIC (IRRIGATION / IRRIGATOR) ×3 IMPLANT
SLEEVE ENDOPATH XCEL 5M (ENDOMECHANICALS) ×6 IMPLANT
SPECIMEN JAR SMALL (MISCELLANEOUS) ×3 IMPLANT
SUT MNCRL AB 4-0 PS2 18 (SUTURE) ×3 IMPLANT
TOWEL OR 17X24 6PK STRL BLUE (TOWEL DISPOSABLE) ×3 IMPLANT
TOWEL OR 17X26 10 PK STRL BLUE (TOWEL DISPOSABLE) ×3 IMPLANT
TRAY LAPAROSCOPIC MC (CUSTOM PROCEDURE TRAY) ×3 IMPLANT
TROCAR XCEL BLUNT TIP 100MML (ENDOMECHANICALS) ×3 IMPLANT
TROCAR XCEL NON-BLD 5MMX100MML (ENDOMECHANICALS) ×3 IMPLANT
TUBING INSUFFLATION (TUBING) ×3 IMPLANT
WATER STERILE IRR 1000ML POUR (IV SOLUTION) ×3 IMPLANT

## 2018-06-19 NOTE — Anesthesia Preprocedure Evaluation (Addendum)
Anesthesia Evaluation  Patient identified by MRN, date of birth, ID band Patient awake    Reviewed: Allergy & Precautions, NPO status , Patient's Chart, lab work & pertinent test results  Airway Mallampati: II  TM Distance: >3 FB Neck ROM: Full    Dental  (+) Dental Advisory Given, Partial Upper   Pulmonary neg pulmonary ROS,    breath sounds clear to auscultation       Cardiovascular hypertension, Pt. on medications  Rhythm:Regular Rate:Normal     Neuro/Psych negative neurological ROS     GI/Hepatic Neg liver ROS, GERD  Medicated,  Endo/Other  negative endocrine ROS  Renal/GU negative Renal ROS     Musculoskeletal negative musculoskeletal ROS (+)   Abdominal Normal abdominal exam  (+)   Peds  Hematology negative hematology ROS (+)   Anesthesia Other Findings   Reproductive/Obstetrics                            Anesthesia Physical Anesthesia Plan  ASA: II  Anesthesia Plan: General   Post-op Pain Management:    Induction: Intravenous  PONV Risk Score and Plan: 3 and Ondansetron, Dexamethasone and Midazolam  Airway Management Planned: Oral ETT  Additional Equipment: None  Intra-op Plan:   Post-operative Plan: Extubation in OR  Informed Consent: I have reviewed the patients History and Physical, chart, labs and discussed the procedure including the risks, benefits and alternatives for the proposed anesthesia with the patient or authorized representative who has indicated his/her understanding and acceptance.   Dental advisory given  Plan Discussed with: CRNA  Anesthesia Plan Comments:        Anesthesia Quick Evaluation

## 2018-06-19 NOTE — Anesthesia Procedure Notes (Signed)
Procedure Name: Intubation Date/Time: 06/19/2018 4:02 PM Performed by: Hart Robinsons, CRNA Pre-anesthesia Checklist: Patient identified, Emergency Drugs available, Suction available and Patient being monitored Patient Re-evaluated:Patient Re-evaluated prior to induction Oxygen Delivery Method: Circle System Utilized Preoxygenation: Pre-oxygenation with 100% oxygen Induction Type: IV induction Ventilation: Mask ventilation without difficulty Laryngoscope Size: Miller and 2 Grade View: Grade I Tube type: Oral Number of attempts: 1 Airway Equipment and Method: Stylet and Oral airway Placement Confirmation: ETT inserted through vocal cords under direct vision,  positive ETCO2 and breath sounds checked- equal and bilateral Secured at: 22 cm Tube secured with: Tape Dental Injury: Teeth and Oropharynx as per pre-operative assessment  Comments: No change in dentition from pre-procedure

## 2018-06-19 NOTE — Transfer of Care (Signed)
Immediate Anesthesia Transfer of Care Note  Patient: Raymond Willis  Procedure(s) Performed: LAPAROSCOPIC CHOLECYSTECTOMY (N/A Abdomen)  Patient Location: PACU  Anesthesia Type:General  Level of Consciousness: awake and alert   Airway & Oxygen Therapy: Patient Spontanous Breathing and Patient connected to nasal cannula oxygen  Post-op Assessment: Report given to RN, Post -op Vital signs reviewed and stable and Patient moving all extremities  Post vital signs: Reviewed and stable  Last Vitals:  Vitals Value Taken Time  BP 138/86 06/19/2018  5:12 PM  Temp    Pulse 84 06/19/2018  5:13 PM  Resp 21 06/19/2018  5:13 PM  SpO2 97 % 06/19/2018  5:13 PM  Vitals shown include unvalidated device data.  Last Pain:  Vitals:   06/19/18 1402  TempSrc:   PainSc: 0-No pain         Complications: No apparent anesthesia complications

## 2018-06-19 NOTE — Interval H&P Note (Signed)
History and Physical Interval Note:no change in H and P  06/19/2018 2:07 PM  Raymond Willis  has presented today for surgery, with the diagnosis of symptomatic gallstones  The various methods of treatment have been discussed with the patient and family. After consideration of risks, benefits and other options for treatment, the patient has consented to  Procedure(s): LAPAROSCOPIC CHOLECYSTECTOMY (N/A) as a surgical intervention .  The patient's history has been reviewed, patient examined, no change in status, stable for surgery.  I have reviewed the patient's chart and labs.  Questions were answered to the patient's satisfaction.     [name]

## 2018-06-19 NOTE — Op Note (Signed)
LAPAROSCOPIC CHOLECYSTECTOMY  Procedure Note  Raymond Willis 06/19/2018   Pre-op Diagnosis: symptomatic cholelithiasis     Post-op Diagnosis: chronic cholecystitis with cholelithiasis  Procedure(s): LAPAROSCOPIC CHOLECYSTECTOMY  Surgeon(s): Abigail Miyamoto, MD  Anesthesia: General  Staff:  Circulator: Rogers Seeds, RN Scrub Person: Liberty Handy  Estimated Blood Loss: Minimal               Specimens: sent to path  Findings: The patient was found to have a small, contracted, thick-walled intrahepatic gallbladder with stones  Procedure: The patient was brought to the operating room and identified as correct patient.  He was placed upon the operating table and general anesthesia was induced.  His abdomen was then prepped and draped in the usual sterile fashion.  I made a small vertical incision below the umbilicus with a scalpel.  I carried this down to the fascia which was then opened with a scalpel.  Hemostat was then used to pass into the peritoneal cavity under direct vision.  A 0 Vicryl pursestring suture was then placed around the fascial opening.  The [facility] port was placed to the opening and insufflation of the abdomen was begun.  I placed a 5 mm trocar in the patient's epigastrium and 2 more in the right upper quadrant all under direct vision.  The gallbladder was identified.  It was covered with omentum.  It was contracted and partially intrahepatic.  It was very thick-walled.  I was able to pull the omentum off the gallbladder.  I was then able to elevate the gallbladder and start dissecting out the base.  The patient had a small bridging vein anteriorly which was clipped.  It was very thickened at the base of the gallbladder and chronically inflamed.  I easily peeled the stomach off of the gallbladder at the base as well.  At this point I decided to take the gallbladder off the liver from a dome down fashion with the cautery.  I was then able to identify  the cystic artery which I clipped proximally distally and transected.  I then identified the cystic duct and achieved a critical end around it.  I placed an Endoloop around the gallbladder and cystic duct and then tightened the Endoloop and placed around the cystic duct.  I then transected the cystic duct distal to this removing the gallbladder.  I then placed a second Endoloop around the cystic stump as well.  I then placed the gallbladder in an Endosac and removed through the incision at the umbilicus.  I achieved hemostasis in the friable liver bed with the cautery and also placed a piece of surgical snow in the liver bed as well for hemostasis.  I then copiously irrigated the right upper quadrant with normal saline.  Hemostasis appeared to be achieved.  All ports were removed under direct vision and the abdomen was deflated.  0 Vicryl the umbilicus was tied in place closing the fascial defect.  All incisions were then anesthetized with Marcaine and closed with 4-0 Monocryl subcuticular sutures.  Dermabond was then applied.  The patient tolerated the procedure well.  All the counts were correct at the end of the procedure.  The patient was then extubated in the operating room and taken in a stable condition to the recovery room.          Raymond Willis A   Date: 06/19/2018  Time: 5:00 PM

## 2018-06-19 NOTE — Discharge Instructions (Signed)
[facility] ______[facility] °LAPAROSCOPIC SURGERY: POST OP INSTRUCTIONS °Always review your discharge instruction sheet given to you by the facility where your surgery was performed. °IF YOU HAVE DISABILITY OR FAMILY LEAVE FORMS, YOU MUST BRING THEM TO THE OFFICE FOR PROCESSING.   °DO NOT GIVE THEM TO YOUR DOCTOR. ° °1. A prescription for pain medication may be given to you upon discharge.  Take your pain medication as prescribed, if needed.  If narcotic pain medicine is not needed, then you may take acetaminophen (Tylenol) or ibuprofen (Advil) as needed. °2. Take your usually prescribed medications unless otherwise directed. °3. If you need a refill on your pain medication, please contact your pharmacy.  They will contact our office to request authorization. Prescriptions will not be filled after 5pm or on week-ends. °4. You should follow a light diet the first few days after arrival home, such as soup and crackers, etc.  Be sure to include lots of fluids daily. °5. Most patients will experience some swelling and bruising in the area of the incisions.  Ice packs will help.  Swelling and bruising can take several days to resolve.  °6. It is common to experience some constipation if taking pain medication after surgery.  Increasing fluid intake and taking a stool softener (such as Colace) will usually help or prevent this problem from occurring.  A mild laxative (Milk of Magnesia or Miralax) should be taken according to package instructions if there are no bowel movements after 48 hours. °7. Unless discharge instructions indicate otherwise, you may remove your bandages 24-48 hours after surgery, and you may shower at that time.  You may have steri-strips (small skin tapes) in place directly over the incision.  These strips should be left on the skin for 7-10 days.  If your surgeon used skin glue on the incision, you may shower in 24 hours.  The glue will flake off over the next 2-3 weeks.  Any sutures or  staples will be removed at the office during your follow-up visit. °8. ACTIVITIES:  You may resume regular (light) daily activities beginning the next day--such as daily self-care, walking, climbing stairs--gradually increasing activities as tolerated.  You may have sexual intercourse when it is comfortable.  Refrain from any heavy lifting or straining until approved by your doctor. °a. You may drive when you are no longer taking prescription pain medication, you can comfortably wear a seatbelt, and you can safely maneuver your car and apply brakes. °b. RETURN TO WORK:  __________________________________________________________ °9. You should see your doctor in the office for a follow-up appointment approximately 2-3 weeks after your surgery.  Make sure that you call for this appointment within a day or two after you arrive home to insure a convenient appointment time. °10. OTHER INSTRUCTIONS:OK TO SHOWER STARTING TOMORROW °11. ICE PACK, TYLENOL, AND IBUPROFEN ALSO FOR PAIN °12. NO LIFTING MORE THAN 15 TO 20 POUNDS FOR 2 WEEKS __________________________________________________________________________________________________________________________ __________________________________________________________________________________________________________________________ °WHEN TO CALL YOUR DOCTOR: °1. Fever over 101.0 °2. Inability to urinate °3. Continued bleeding from incision. °4. Increased pain, redness, or drainage from the incision. °5. Increasing abdominal pain ° °The clinic staff is available to answer your questions during regular business hours.  Please don’t hesitate to call and ask to speak to one of the nurses for clinical concerns.  If you have a medical emergency, go to the nearest emergency room or call 911.  A surgeon from [facility] is always on call at the hospital. °[address],   [address], [place], [place]  [address] ? [address] [address], [place], [place]   [address] °[phone] ?  [phone] ? FAX [phone] °Web site: [email or web address] °

## 2018-06-20 ENCOUNTER — Encounter (HOSPITAL_COMMUNITY): Payer: Self-pay | Admitting: Surgery

## 2018-06-25 NOTE — Anesthesia Postprocedure Evaluation (Signed)
Anesthesia Post Note  Patient: Raymond Willis  Procedure(s) Performed: LAPAROSCOPIC CHOLECYSTECTOMY (N/A Abdomen)     Patient location during evaluation: PACU Anesthesia Type: General Level of consciousness: awake and sedated Pain management: pain level controlled Vital Signs Assessment: post-procedure vital signs reviewed and stable Respiratory status: spontaneous breathing Cardiovascular status: stable Postop Assessment: no apparent nausea or vomiting Anesthetic complications: no    Last Vitals:  Vitals:   06/19/18 1755 06/19/18 1810  BP: (!) 137/94 118/85  Pulse: 84 84  Resp: 18 17  Temp:  36.7 C  SpO2: 94% 94%    Last Pain:  Vitals:   06/19/18 1810  TempSrc:   PainSc: Asleep   Pain Goal: Patients Stated Pain Goal: 3 (06/19/18 1747)               Caren Macadam

## 2018-07-19 ENCOUNTER — Encounter: Payer: Self-pay | Admitting: Family Medicine

## 2018-07-19 ENCOUNTER — Ambulatory Visit: Payer: Managed Care, Other (non HMO) | Admitting: Family Medicine

## 2018-07-19 VITALS — BP 138/84 | HR 96 | Temp 99.1°F | Ht 67.6 in | Wt 214.8 lb

## 2018-07-19 DIAGNOSIS — R1013 Epigastric pain: Secondary | ICD-10-CM | POA: Diagnosis not present

## 2018-07-19 MED ORDER — ONDANSETRON HCL 4 MG PO TABS: 4.0000 mg | ORAL_TABLET | Freq: Three times a day (TID) | ORAL | 0 refills | Status: DC | PRN

## 2018-07-19 NOTE — Patient Instructions (Addendum)
Go to the lab on the way out.  We'll contact you with your lab report. Restart prilosec to see if that helps.  Use zofran as needed for nausea.  Rest and sips of fluids.  Avoid fatty foods.  Try soup, crackers, etc.   Update me as needed.  Take care.  Glad to see you.

## 2018-07-19 NOTE — Progress Notes (Signed)
Recent events discussed with patient.  1 month out from cholecystectomy.  He had routine post op course.   Some bloating but no diarrhea after the first week.  He was feeling okay until yesterday.    He felt bloated but this felt different from prev.  The longer he laid around the worse it got.  Vomited liquid last night, not bloody, not black.  Then he had some pain near the inferior sternum with belching.  No pain o/w.  Some discomfort near the sternum with a deep breath, similar to the sternal pain but not as bad, he can reproduce this with leaning forward and also with back extension to strain the abdominal wall..  No fevers but he had a sweat this AM.  No rash.  No blood in stool or urine.    He went to work today.  He stopped prilosec after the operation.  He has been taking gasX in the meantime.    PMH and SH reviewed  ROS: Per HPI unless specifically indicated in ROS section   Meds, vitals, and allergies reviewed.   GEN: nad, alert and oriented HEENT: mucous membranes moist NECK: supple w/o LA CV: rrr PULM: ctab, no inc wob ABD: soft, +bs, he is tender right at the inferior portion of the sternum.  No rebound.  He is tender locally when he takes a deep breath. Right upper quadrant not tender.  He has healing port sites from laparoscopic cholecystectomy without spreading erythema or drainage.  The upper central abdominal port site has slightly more prominent scar/small keloid change EXT: no edema SKIN: no acute rash

## 2018-07-20 LAB — CBC WITH DIFFERENTIAL/PLATELET
BASOS PCT: 0.2 %
Basophils Absolute: 20 cells/uL (ref 0–200)
EOS ABS: 0 {cells}/uL — AB (ref 15–500)
Eosinophils Relative: 0 %
HCT: 45 % (ref 38.5–50.0)
Hemoglobin: 15.4 g/dL (ref 13.2–17.1)
Lymphs Abs: 1596 cells/uL (ref 850–3900)
MCH: 27.8 pg (ref 27.0–33.0)
MCHC: 34.2 g/dL (ref 32.0–36.0)
MCV: 81.2 fL (ref 80.0–100.0)
MONOS PCT: 9 %
MPV: 10.6 fL (ref 7.5–12.5)
Neutro Abs: 7575 cells/uL (ref 1500–7800)
Neutrophils Relative %: 75 %
PLATELETS: 391 10*3/uL (ref 140–400)
RBC: 5.54 10*6/uL (ref 4.20–5.80)
RDW: 13 % (ref 11.0–15.0)
TOTAL LYMPHOCYTE: 15.8 %
WBC mixed population: 909 cells/uL (ref 200–950)
WBC: 10.1 10*3/uL (ref 3.8–10.8)

## 2018-07-20 LAB — COMPREHENSIVE METABOLIC PANEL
AG Ratio: 1.5 (calc) (ref 1.0–2.5)
ALKALINE PHOSPHATASE (APISO): 216 U/L — AB (ref 40–115)
ALT: 576 U/L — ABNORMAL HIGH (ref 9–46)
AST: 313 U/L — AB (ref 10–35)
Albumin: 4.6 g/dL (ref 3.6–5.1)
BILIRUBIN TOTAL: 5.7 mg/dL — AB (ref 0.2–1.2)
BUN: 13 mg/dL (ref 7–25)
CALCIUM: 10.2 mg/dL (ref 8.6–10.3)
CHLORIDE: 98 mmol/L (ref 98–110)
CO2: 24 mmol/L (ref 20–32)
CREATININE: 1.18 mg/dL (ref 0.70–1.33)
GLOBULIN: 3.1 g/dL (ref 1.9–3.7)
Glucose, Bld: 122 mg/dL — ABNORMAL HIGH (ref 65–99)
POTASSIUM: 3.9 mmol/L (ref 3.5–5.3)
Sodium: 136 mmol/L (ref 135–146)
Total Protein: 7.7 g/dL (ref 6.1–8.1)

## 2018-07-20 LAB — LIPASE: LIPASE: 269 U/L — AB (ref 7–60)

## 2018-07-21 ENCOUNTER — Encounter: Payer: Self-pay | Admitting: Family Medicine

## 2018-07-21 DIAGNOSIS — R1013 Epigastric pain: Secondary | ICD-10-CM | POA: Insufficient documentation

## 2018-07-21 NOTE — Assessment & Plan Note (Signed)
Nontoxic.  Okay for outpatient follow-up.  Discussed options. I think that he likely has an upper abdominal wall strain related to vomiting.  That is reproducible on exam.  He could have thrown up from either a viral issue that has been noted in the community recently or it could be related to GERD.  I do not suspect an ominous process.  He has a benign abdominal exam otherwise.  This does not appear to be related to his previous operation.  Still reasonable to check routine labs today.  See notes on labs.  He can use Zofran for nausea.  Supportive care otherwise.  Avoid fatty foods.  Increase fluid intake and he can advance to solids as tolerated.  See after visit summary.  He agrees. >25 minutes spent in face to face time with patient, >50% spent in counselling or coordination of care.

## 2018-07-22 ENCOUNTER — Encounter: Payer: Self-pay | Admitting: Family Medicine

## 2018-07-22 ENCOUNTER — Ambulatory Visit: Payer: Managed Care, Other (non HMO) | Admitting: Family Medicine

## 2018-07-22 VITALS — BP 102/80 | HR 85 | Temp 99.0°F | Ht 67.6 in | Wt 212.5 lb

## 2018-07-22 DIAGNOSIS — R945 Abnormal results of liver function studies: Secondary | ICD-10-CM | POA: Diagnosis not present

## 2018-07-22 DIAGNOSIS — R7989 Other specified abnormal findings of blood chemistry: Secondary | ICD-10-CM

## 2018-07-22 LAB — POC URINALSYSI DIPSTICK (AUTOMATED)
GLUCOSE UA: NEGATIVE
Ketones, UA: NEGATIVE
NITRITE UA: NEGATIVE
Protein, UA: POSITIVE — AB
RBC UA: NEGATIVE
Spec Grav, UA: >=1.03 — AB (ref 1.010–1.025)
Urobilinogen, UA: 1 E.U./dL
pH, UA: 6 (ref 5.0–8.0)

## 2018-07-22 NOTE — Progress Notes (Signed)
To recap recent events he had cholecystectomy with known gallstones.  He had uncomplicated postop course.  He had different symptoms that led to office visit last week.  He has only vomited once and that was prior to his last office visit.  He had an overall benign abdominal exam last week at the visit.  We checked routine labs and he had LFT and lipase elevation with elevated bilirubin.  White count was still normal.  He did mention at the last office visit but he had dark urine at that point.  I did not note any significant change in his conjunctiva at that point.  I called him last night about his labs.  I asked him to come back in today since he was feeling better.  No vomiting in the meantime.  He tried taking cholestyramine with water but didn't tolerate that.    He still notes dark urine and the whites of his eyes were more yellow then normal.    No fevers in the meantime.  No vomiting in 4 days now.  No abd pain now.    PMH and SH reviewed  ROS: Per HPI unless specifically indicated in ROS section   Meds, vitals, and allergies reviewed.   GEN: nad, alert and oriented HEENT: mucous membranes moist NECK: supple w/o LA CV: rrr.  PULM: ctab, no inc wob ABD: soft, +bs, abdomen completely nontender. EXT: no edema SKIN: Dry skin noted on back.  Scleral icterus noted.

## 2018-07-22 NOTE — Patient Instructions (Signed)
Go to the lab on the way out.  We'll contact you with your lab report. If you have more abdominal pain or other troubles, then go to the ER.  I'll update the GI clinic.  Take care.  Glad to see you.  Avoid tylenol, alcohol, fatty foods.

## 2018-07-23 ENCOUNTER — Other Ambulatory Visit: Payer: Self-pay | Admitting: Family Medicine

## 2018-07-23 DIAGNOSIS — R945 Abnormal results of liver function studies: Principal | ICD-10-CM

## 2018-07-23 DIAGNOSIS — R7989 Other specified abnormal findings of blood chemistry: Secondary | ICD-10-CM

## 2018-07-23 LAB — HEPATITIS PANEL, ACUTE
HEP B C IGM: NONREACTIVE
HEP B S AG: NONREACTIVE
HEP C AB: NONREACTIVE
Hep A IgM: NONREACTIVE
SIGNAL TO CUT-OFF: 0.04 (ref <1.00)

## 2018-07-23 LAB — CBC WITH DIFFERENTIAL/PLATELET
BASOS PCT: 1.5 % (ref 0.0–3.0)
Basophils Absolute: 0.1 10*3/uL (ref 0.0–0.1)
Eosinophils Absolute: 0.2 10*3/uL (ref 0.0–0.7)
Eosinophils Relative: 2.7 % (ref 0.0–5.0)
HEMATOCRIT: 43.7 % (ref 39.0–52.0)
HEMOGLOBIN: 14.7 g/dL (ref 13.0–17.0)
LYMPHS PCT: 29.6 % (ref 12.0–46.0)
Lymphs Abs: 2 10*3/uL (ref 0.7–4.0)
MCHC: 33.7 g/dL (ref 30.0–36.0)
MCV: 83.8 fl (ref 78.0–100.0)
Monocytes Absolute: 1.1 10*3/uL — ABNORMAL HIGH (ref 0.1–1.0)
Monocytes Relative: 15.8 % — ABNORMAL HIGH (ref 3.0–12.0)
Neutro Abs: 3.4 10*3/uL (ref 1.4–7.7)
Neutrophils Relative %: 50.4 % (ref 43.0–77.0)
Platelets: 259 10*3/uL (ref 150.0–400.0)
RBC: 5.21 Mil/uL (ref 4.22–5.81)
RDW: 14.2 % (ref 11.5–15.5)
WBC: 6.8 10*3/uL (ref 4.0–10.5)

## 2018-07-23 LAB — COMPREHENSIVE METABOLIC PANEL
ALBUMIN: 4.3 g/dL (ref 3.5–5.2)
ALK PHOS: 262 U/L — AB (ref 39–117)
ALT: 244 U/L — ABNORMAL HIGH (ref 0–53)
AST: 79 U/L — ABNORMAL HIGH (ref 0–37)
BUN: 14 mg/dL (ref 6–23)
CALCIUM: 9.7 mg/dL (ref 8.4–10.5)
CHLORIDE: 101 meq/L (ref 96–112)
CO2: 26 mEq/L (ref 19–32)
CREATININE: 1.5 mg/dL (ref 0.40–1.50)
GFR: 61.88 mL/min (ref >60.00)
Glucose, Bld: 102 mg/dL — ABNORMAL HIGH (ref 70–99)
POTASSIUM: 3.9 meq/L (ref 3.5–5.1)
Sodium: 140 mEq/L (ref 135–145)
TOTAL PROTEIN: 7.7 g/dL (ref 6.0–8.3)
Total Bilirubin: 8.6 mg/dL — ABNORMAL HIGH (ref 0.2–1.2)

## 2018-07-23 LAB — LIPASE: Lipase: 49 U/L (ref 11.0–59.0)

## 2018-07-23 NOTE — Assessment & Plan Note (Addendum)
Discussed pathophysiology.  I do not suspect that the patient had a complication related to surgery.  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  This can still occasionally happen.  He may be predisposed to stone formation.  Previous ultrasound discussed.  History of fatty liver noted, gallstones noted on previous ultrasound.  Discussed options.  He still has scleral icterus but he feels better in the meantime.  He has no abdominal pain and says that he feels fine at the office visit.  Recheck labs today.  Check hepatitis panel.  Will update GI after I get his labs.  If he has any worsening of symptoms in the meantime then I want him to go to the emergency room.  He understood the plan.  At this point still okay for outpatient follow-up. >25 minutes spent in face to face time with patient, >50% spent in counselling or coordination of care.   He previously did not tolerate the taste of cholestyramine with water so it is reasonable to hold that for now.

## 2018-07-24 ENCOUNTER — Telehealth: Payer: Self-pay | Admitting: Family Medicine

## 2018-07-24 ENCOUNTER — Other Ambulatory Visit: Payer: Self-pay | Admitting: Family Medicine

## 2018-07-24 DIAGNOSIS — K859 Acute pancreatitis without necrosis or infection, unspecified: Secondary | ICD-10-CM

## 2018-07-24 NOTE — Telephone Encounter (Signed)
Rene KocherRegina- please call pt.   D/w Dr. Dulce Sellarutlaw.  Needs MRCP, ordered.  Needs f/u with Dr. Dulce Sellarutlaw after MRCP is done.   If he feels worse, then to ER.  I still want to check his f/u labs as planned.    Shirlee LimerickMarion- please call [facility] about scheduling f/u there when MRCP is scheduled.   Thanks.

## 2018-07-24 NOTE — Telephone Encounter (Signed)
Patient scheduled at [facility] for 07/25/18 at 7:30am and patient is aware.

## 2018-07-24 NOTE — Telephone Encounter (Signed)
Patient notified as instructed by telephone and verbalized understanding. Advised patient that Shirlee LimerickMarion will be in touch with him about appointments.

## 2018-07-25 ENCOUNTER — Other Ambulatory Visit: Payer: Self-pay | Admitting: Family Medicine

## 2018-07-25 ENCOUNTER — Other Ambulatory Visit: Payer: Self-pay

## 2018-07-25 ENCOUNTER — Other Ambulatory Visit: Payer: Managed Care, Other (non HMO)

## 2018-07-25 ENCOUNTER — Inpatient Hospital Stay (HOSPITAL_COMMUNITY)
Admission: EM | Admit: 2018-07-25 | Discharge: 2018-07-27 | DRG: 395 | Disposition: A | Discharge status: Home / Self Care | Payer: Managed Care, Other (non HMO) | Attending: Internal Medicine | Admitting: Internal Medicine

## 2018-07-25 ENCOUNTER — Ambulatory Visit (HOSPITAL_COMMUNITY)
Admission: RE | Admit: 2018-07-25 | Discharge: 2018-07-25 | Disposition: A | Discharge status: Home / Self Care | Payer: Managed Care, Other (non HMO) | Source: Ambulatory Visit | Attending: Family Medicine | Admitting: Family Medicine

## 2018-07-25 ENCOUNTER — Encounter (HOSPITAL_COMMUNITY): Payer: Self-pay | Admitting: Emergency Medicine

## 2018-07-25 DIAGNOSIS — K8050 Calculus of bile duct without cholangitis or cholecystitis without obstruction: Secondary | ICD-10-CM

## 2018-07-25 DIAGNOSIS — E7800 Pure hypercholesterolemia, unspecified: Secondary | ICD-10-CM | POA: Diagnosis present

## 2018-07-25 DIAGNOSIS — M109 Gout, unspecified: Secondary | ICD-10-CM | POA: Diagnosis present

## 2018-07-25 DIAGNOSIS — R109 Unspecified abdominal pain: Secondary | ICD-10-CM | POA: Diagnosis present

## 2018-07-25 DIAGNOSIS — K8051 Calculus of bile duct without cholangitis or cholecystitis with obstruction: Secondary | ICD-10-CM

## 2018-07-25 DIAGNOSIS — K9186 Retained cholelithiasis following cholecystectomy: Principal | ICD-10-CM | POA: Diagnosis present

## 2018-07-25 DIAGNOSIS — E785 Hyperlipidemia, unspecified: Secondary | ICD-10-CM | POA: Diagnosis not present

## 2018-07-25 DIAGNOSIS — K859 Acute pancreatitis without necrosis or infection, unspecified: Secondary | ICD-10-CM

## 2018-07-25 DIAGNOSIS — G4700 Insomnia, unspecified: Secondary | ICD-10-CM | POA: Diagnosis present

## 2018-07-25 DIAGNOSIS — N261 Atrophy of kidney (terminal): Secondary | ICD-10-CM | POA: Insufficient documentation

## 2018-07-25 DIAGNOSIS — Z888 Allergy status to other drugs, medicaments and biological substances status: Secondary | ICD-10-CM

## 2018-07-25 DIAGNOSIS — Z9861 Coronary angioplasty status: Secondary | ICD-10-CM | POA: Diagnosis not present

## 2018-07-25 DIAGNOSIS — R932 Abnormal findings on diagnostic imaging of liver and biliary tract: Secondary | ICD-10-CM | POA: Insufficient documentation

## 2018-07-25 DIAGNOSIS — Z880 Allergy status to penicillin: Secondary | ICD-10-CM | POA: Diagnosis not present

## 2018-07-25 DIAGNOSIS — Z87710 Personal history of (corrected) hypospadias: Secondary | ICD-10-CM | POA: Diagnosis not present

## 2018-07-25 DIAGNOSIS — Z9049 Acquired absence of other specified parts of digestive tract: Secondary | ICD-10-CM | POA: Diagnosis not present

## 2018-07-25 DIAGNOSIS — E876 Hypokalemia: Secondary | ICD-10-CM | POA: Diagnosis present

## 2018-07-25 DIAGNOSIS — R1013 Epigastric pain: Secondary | ICD-10-CM | POA: Diagnosis present

## 2018-07-25 DIAGNOSIS — M1000 Idiopathic gout, unspecified site: Secondary | ICD-10-CM | POA: Diagnosis not present

## 2018-07-25 DIAGNOSIS — R7303 Prediabetes: Secondary | ICD-10-CM | POA: Diagnosis present

## 2018-07-25 DIAGNOSIS — E559 Vitamin D deficiency, unspecified: Secondary | ICD-10-CM | POA: Diagnosis present

## 2018-07-25 DIAGNOSIS — I10 Essential (primary) hypertension: Secondary | ICD-10-CM | POA: Diagnosis present

## 2018-07-25 DIAGNOSIS — K219 Gastro-esophageal reflux disease without esophagitis: Secondary | ICD-10-CM | POA: Diagnosis present

## 2018-07-25 DIAGNOSIS — Z833 Family history of diabetes mellitus: Secondary | ICD-10-CM

## 2018-07-25 DIAGNOSIS — R17 Unspecified jaundice: Secondary | ICD-10-CM

## 2018-07-25 DIAGNOSIS — Z801 Family history of malignant neoplasm of trachea, bronchus and lung: Secondary | ICD-10-CM

## 2018-07-25 DIAGNOSIS — K8021 Calculus of gallbladder without cholecystitis with obstruction: Secondary | ICD-10-CM

## 2018-07-25 DIAGNOSIS — R1084 Generalized abdominal pain: Secondary | ICD-10-CM | POA: Diagnosis not present

## 2018-07-25 DIAGNOSIS — I517 Cardiomegaly: Secondary | ICD-10-CM | POA: Insufficient documentation

## 2018-07-25 DIAGNOSIS — R945 Abnormal results of liver function studies: Secondary | ICD-10-CM

## 2018-07-25 DIAGNOSIS — R7989 Other specified abnormal findings of blood chemistry: Secondary | ICD-10-CM | POA: Diagnosis present

## 2018-07-25 DIAGNOSIS — Z8249 Family history of ischemic heart disease and other diseases of the circulatory system: Secondary | ICD-10-CM

## 2018-07-25 LAB — SURGICAL PCR SCREEN
MRSA, PCR: NEGATIVE
Staphylococcus aureus: NEGATIVE

## 2018-07-25 LAB — COMPREHENSIVE METABOLIC PANEL WITH GFR
ALT: 353 U/L — ABNORMAL HIGH (ref 0–44)
AST: 194 U/L — ABNORMAL HIGH (ref 15–41)
Albumin: 4.1 g/dL (ref 3.5–5.0)
Alkaline Phosphatase: 328 U/L — ABNORMAL HIGH (ref 38–126)
Anion gap: 12 (ref 5–15)
BUN: 14 mg/dL (ref 6–20)
CO2: 29 mmol/L (ref 22–32)
Calcium: 9.5 mg/dL (ref 8.9–10.3)
Chloride: 99 mmol/L (ref 98–111)
Creatinine, Ser: 1.08 mg/dL (ref 0.61–1.24)
GFR calc Af Amer: >60 mL/min
GFR calc non Af Amer: >60 mL/min
Glucose, Bld: 94 mg/dL (ref 70–99)
Potassium: 3.3 mmol/L — ABNORMAL LOW (ref 3.5–5.1)
Sodium: 140 mmol/L (ref 135–145)
Total Bilirubin: 12.2 mg/dL — ABNORMAL HIGH (ref 0.3–1.2)
Total Protein: 8.1 g/dL (ref 6.5–8.1)

## 2018-07-25 LAB — GLUCOSE, CAPILLARY
GLUCOSE-CAPILLARY: 137 mg/dL — AB (ref 70–99)
Glucose-Capillary: 93 mg/dL (ref 70–99)

## 2018-07-25 LAB — CBC WITH DIFFERENTIAL/PLATELET
Abs Immature Granulocytes: 0.03 K/uL (ref 0.00–0.07)
Basophils Absolute: 0 K/uL (ref 0.0–0.1)
Basophils Relative: 0 %
Eosinophils Absolute: 0.1 K/uL (ref 0.0–0.5)
Eosinophils Relative: 2 %
HCT: 44.7 % (ref 39.0–52.0)
Hemoglobin: 14.7 g/dL (ref 13.0–17.0)
Immature Granulocytes: 1 %
Lymphocytes Relative: 33 %
Lymphs Abs: 1.9 K/uL (ref 0.7–4.0)
MCH: 28.4 pg (ref 26.0–34.0)
MCHC: 32.9 g/dL (ref 30.0–36.0)
MCV: 86.3 fL (ref 80.0–100.0)
Monocytes Absolute: 0.7 K/uL (ref 0.1–1.0)
Monocytes Relative: 13 %
Neutro Abs: 2.8 K/uL (ref 1.7–7.7)
Neutrophils Relative %: 51 %
Platelets: 298 K/uL (ref 150–400)
RBC: 5.18 MIL/uL (ref 4.22–5.81)
RDW: 14.3 % (ref 11.5–15.5)
WBC: 5.6 K/uL (ref 4.0–10.5)
nRBC: 0 % (ref 0.0–0.2)

## 2018-07-25 LAB — URINALYSIS, ROUTINE W REFLEX MICROSCOPIC
Bacteria, UA: NONE SEEN
Glucose, UA: NEGATIVE mg/dL
Hgb urine dipstick: NEGATIVE
Ketones, ur: 5 mg/dL — AB
Leukocytes, UA: NEGATIVE
Nitrite: NEGATIVE
Protein, ur: 30 mg/dL — AB
Specific Gravity, Urine: 1.027 (ref 1.005–1.030)
pH: 6 (ref 5.0–8.0)

## 2018-07-25 LAB — LIPASE, BLOOD: LIPASE: 43 U/L (ref 11–51)

## 2018-07-25 MED ORDER — ONDANSETRON HCL 4 MG/2ML IJ SOLN: 4.0000 mg | Freq: Four times a day (QID) | INTRAMUSCULAR | Status: DC | PRN

## 2018-07-25 MED ORDER — LISINOPRIL 20 MG PO TABS
20.0000 mg | ORAL_TABLET | Freq: Every day | ORAL | Status: DC
  Administered 2018-07-25 – 2018-07-27 (×3): 20 mg via ORAL
  Filled 2018-07-25 (×4): qty 1

## 2018-07-25 MED ORDER — GADOBUTROL 1 MMOL/ML IV SOLN
9.0000 mL | Freq: Once | INTRAVENOUS | Status: AC | PRN
  Administered 2018-07-25: 9 mL via INTRAVENOUS

## 2018-07-25 MED ORDER — INSULIN ASPART 100 UNIT/ML ~~LOC~~ SOLN: 0.0000 [IU] | Freq: Three times a day (TID) | SUBCUTANEOUS | Status: DC

## 2018-07-25 MED ORDER — SIMVASTATIN 10 MG PO TABS
10.0000 mg | ORAL_TABLET | Freq: Every day | ORAL | Status: DC
  Administered 2018-07-25: 10 mg via ORAL
  Filled 2018-07-25: qty 1

## 2018-07-25 MED ORDER — SODIUM CHLORIDE 0.9 % IV SOLN
INTRAVENOUS | Status: DC
  Administered 2018-07-25: 14:00:00 via INTRAVENOUS

## 2018-07-25 MED ORDER — SENNOSIDES-DOCUSATE SODIUM 8.6-50 MG PO TABS: 1.0000 | ORAL_TABLET | Freq: Every evening | ORAL | Status: DC | PRN

## 2018-07-25 MED ORDER — OXYCODONE HCL 5 MG PO TABS: 5.0000 mg | ORAL_TABLET | ORAL | Status: DC | PRN

## 2018-07-25 MED ORDER — LISINOPRIL-HYDROCHLOROTHIAZIDE 20-12.5 MG PO TABS: 1.0000 | ORAL_TABLET | Freq: Every day | ORAL | Status: DC

## 2018-07-25 MED ORDER — HYDROCHLOROTHIAZIDE 12.5 MG PO CAPS
12.5000 mg | ORAL_CAPSULE | Freq: Every day | ORAL | Status: DC
  Administered 2018-07-25 – 2018-07-27 (×3): 12.5 mg via ORAL
  Filled 2018-07-25 (×3): qty 1

## 2018-07-25 MED ORDER — PANTOPRAZOLE SODIUM 40 MG PO TBEC
40.0000 mg | DELAYED_RELEASE_TABLET | Freq: Every day | ORAL | Status: DC
  Administered 2018-07-25 – 2018-07-27 (×3): 40 mg via ORAL
  Filled 2018-07-25: qty 1
  Filled 2018-07-25: qty 2
  Filled 2018-07-25: qty 1

## 2018-07-25 MED ORDER — KETOROLAC TROMETHAMINE 30 MG/ML IJ SOLN: 30.0000 mg | Freq: Four times a day (QID) | INTRAMUSCULAR | Status: DC | PRN

## 2018-07-25 MED ORDER — MAGNESIUM CITRATE PO SOLN: 1.0000 | Freq: Once | ORAL | Status: DC | PRN

## 2018-07-25 MED ORDER — SODIUM CHLORIDE 0.9 % IV BOLUS
1000.0000 mL | Freq: Once | INTRAVENOUS | Status: AC
  Administered 2018-07-25: 1000 mL via INTRAVENOUS

## 2018-07-25 MED ORDER — AMLODIPINE BESYLATE 10 MG PO TABS
10.0000 mg | ORAL_TABLET | Freq: Every day | ORAL | Status: DC
  Administered 2018-07-25 – 2018-07-27 (×3): 10 mg via ORAL
  Filled 2018-07-25 (×2): qty 1
  Filled 2018-07-25: qty 2

## 2018-07-25 MED ORDER — BISACODYL 5 MG PO TBEC: 5.0000 mg | DELAYED_RELEASE_TABLET | Freq: Every day | ORAL | Status: DC | PRN

## 2018-07-25 MED ORDER — MORPHINE SULFATE (PF) 2 MG/ML IV SOLN: 1.0000 mg | INTRAVENOUS | Status: AC | PRN

## 2018-07-25 MED ORDER — ONDANSETRON HCL 4 MG PO TABS: 4.0000 mg | ORAL_TABLET | Freq: Four times a day (QID) | ORAL | Status: DC | PRN

## 2018-07-25 NOTE — ED Notes (Signed)
Bed: AV40WA14 Expected date:  Expected time:  Means of arrival:  Comments: [place]

## 2018-07-25 NOTE — ED Notes (Signed)
ED TO INPATIENT HANDOFF REPORT  Name/Age/Gender Raymond Willis 57 y.o. male  Code Status    Code Status Orders  (From admission, onward)         Start     Ordered   07/25/18 1253  Full code  Continuous     07/25/18 1255        Code Status History    Date Active Date Inactive Code Status Order ID Comments User Context   09/25/2014 2217 09/26/2014 1859 Full Code [id number]  Raynelle Bring, MD Inpatient      Home/SNF/Other Home  Chief Complaint Sent from MRI  Level of Care/Admitting Diagnosis ED Disposition    ED Disposition Condition [place]: [facility] [100102]  Level of Care: Med-Surg [16]  Diagnosis: Abdominal pain [703500]  Admitting Physician: Gerlean Ren [facility] [[id number]]  Attending Physician: Gerlean Ren [facility] [[id number]]  Estimated length of stay: past midnight tomorrow  Certification:: I certify this patient will need inpatient services for at least 2 midnights  PT Class (Do Not Modify): Inpatient [101]  PT Acc Code (Do Not Modify): Private [1]       Medical History Past Medical History:  Diagnosis Date  . Erectile dysfunction   . Hypercholesteremia   . Hypertension   . Insomnia   . Prediabetes   . Vitamin D deficiency     Allergies Allergies  Allergen Reactions  . Penicillins Hives    Has patient had a PCN reaction causing immediate rash, facial/tongue/throat swelling, SOB or lightheadedness with hypotension: unkn Has patient had a PCN reaction causing severe rash involving mucus membranes or skin necrosis: unkn Has patient had a PCN reaction that required hospitalization: unkn Has patient had a PCN reaction occurring within the last 10 years: no If all of the above answers are "NO", then may proceed with Cephalosporin use.   . Sildenafil Nausea And Vomiting    Possible cause of GI upset    IV Location/Drains/Wounds Patient Lines/Drains/Airways Status   Active Line/Drains/Airways    Name:    Placement date:   Placement time:   Site:   Days:   Peripheral IV 07/25/18 Right Antecubital   07/25/18    1157    Antecubital   less than 1   Open Drain 1 Left Other (Comment) 0.8 Fr.   09/25/14    2015    Other (Comment)   1399   Incision (Closed) 09/25/14 Perineum Other (Comment)   09/25/14    2042     1399   Incision (Closed) 06/19/18 Abdomen Other (Comment)   06/19/18    1631     36   Incision - 4 Ports Abdomen 1: Umbilicus 2: Right;Lateral 3: Left;Lateral;Upper 4: Mid;Upper   06/19/18    1631     36          Labs/Imaging Results for orders placed or performed during the hospital encounter of 07/25/18 (from the past 48 hour(s))  CBC with Differential     Status: None   Collection Time: 07/25/18 11:48 AM  Result Value Ref Range   WBC 5.6 4.0 - 10.5 K/uL   RBC 5.18 4.22 - 5.81 MIL/uL   Hemoglobin 14.7 13.0 - 17.0 g/dL   HCT 44.7 39.0 - 52.0 %   MCV 86.3 80.0 - 100.0 fL   MCH 28.4 26.0 - 34.0 pg   MCHC 32.9 30.0 - 36.0 g/dL   RDW 14.3 11.5 - 15.5 %   Platelets 298  150 - 400 K/uL   nRBC 0.0 0.0 - 0.2 %   Neutrophils Relative % 51 %   Neutro Abs 2.8 1.7 - 7.7 K/uL   Lymphocytes Relative 33 %   Lymphs Abs 1.9 0.7 - 4.0 K/uL   Monocytes Relative 13 %   Monocytes Absolute 0.7 0.1 - 1.0 K/uL   Eosinophils Relative 2 %   Eosinophils Absolute 0.1 0.0 - 0.5 K/uL   Basophils Relative 0 %   Basophils Absolute 0.0 0.0 - 0.1 K/uL   Immature Granulocytes 1 %   Abs Immature Granulocytes 0.03 0.00 - 0.07 K/uL    Comment: Performed at [facility], [place] [address] [place], [place] [address]  Comprehensive metabolic panel     Status: Abnormal   Collection Time: 07/25/18 11:48 AM  Result Value Ref Range   Sodium 140 135 - 145 mmol/L   Potassium 3.3 (L) 3.5 - 5.1 mmol/L   Chloride 99 98 - 111 mmol/L   CO2 29 22 - 32 mmol/L   Glucose, Bld 94 70 - 99 mg/dL   BUN 14 6 - 20 mg/dL   Creatinine, Ser 1.08 0.61 - 1.24 mg/dL   Calcium 9.5 8.9 - 10.3 mg/dL   Total Protein 8.1  6.5 - 8.1 g/dL   Albumin 4.1 3.5 - 5.0 g/dL   AST 194 (H) 15 - 41 U/L   ALT 353 (H) 0 - 44 U/L   Alkaline Phosphatase 328 (H) 38 - 126 U/L   Total Bilirubin 12.2 (H) 0.3 - 1.2 mg/dL   GFR calc non Af Amer >60 >60 mL/min   GFR calc Af Amer >60 >60 mL/min    Comment: (NOTE) The eGFR has been calculated using the CKD EPI equation. This calculation has not been validated in all clinical situations. eGFR's persistently <60 mL/min signify possible Chronic Kidney Disease.    Anion gap 12 5 - 15    Comment: Performed at [facility], [place] [address], [place], [place] [address]  Lipase, blood     Status: None   Collection Time: 07/25/18 11:48 AM  Result Value Ref Range   Lipase 43 11 - 51 U/L    Comment: Performed at [facility], [place] [address] [place],  [address]   Mr 3d Recon At Scanner  Addendum Date: 07/25/2018   ADDENDUM REPORT: 07/25/2018 11:12 ADDENDUM: The original report was by Dr. Van Clines. The following addendum is by Dr. Van Clines: The impressions from the report were called by technologist Horris Latino to Dr.Duncan this morning. Dr. Damita Dunnings passed on instructions for the patient to proceed to the emergency room. I had gotten called away to do a procedure and accordingly was not able to reach Dr. Damita Dunnings myself, but I am available to discuss the case further if needed. Electronically Signed   By: Van Clines M.D.   On: 07/25/2018 11:12   Result Date: 07/25/2018 CLINICAL DATA:  Cholecystectomy on 06/19/2018.  Acute pancreatitis. EXAM: MRI ABDOMEN WITHOUT AND WITH CONTRAST (INCLUDING MRCP) TECHNIQUE: Multiplanar multisequence MR imaging of the abdomen was performed both before and after the administration of intravenous contrast. Heavily T2-weighted images of the biliary and pancreatic ducts were obtained, and three-dimensional MRCP images were rendered by post processing. CONTRAST:  9 cc Gadavist COMPARISON:  Ultrasound  05/03/2018. FINDINGS: Lower chest: Mild cardiomegaly. Hepatobiliary: Cholecystectomy with 1.8 by 1.3 cm small fluid signal intensity along the gallbladder fossa likely reflecting a small postoperative fluid collection. There is moderate intrahepatic  biliary dilatation with a distended cystic duct remnant up to 1.4 cm in diameter, and a common hepatic duct at 1.1 cm in diameter. The common bile duct is likewise dilated up to about 0.8 cm in diameter, extending down to a 10 by 8 mm in diameter filling defect in the CBD on image 68/400 compatible with stone. This is about 1.9 cm proximal to the ampulla and is believed to be causing biliary obstruction. There is mildly accentuated enhancement along the margins of the bile ducts raising the possibility of cholangitis. Pancreas: Slight irregularity of the dorsal pancreatic duct especially in the pancreatic head may be a manifestation of the patient's recent pancreatitis. No findings of pancreatic necrosis, pseudocyst, or abscess. We do not demonstrate a prominent degree of peripancreatic inflammatory stranding currently. Spleen:  Unremarkable Adrenals/Urinary Tract: Severely atrophic right kidney is likely not contributing much in the way of relative functional renal mass. Both adrenal glands are unremarkable. Small benign cyst of the left kidney lower pole. Stomach/Bowel: Unremarkable Vascular/Lymphatic: A retrocaval node measures 0.9 cm in short axis on image 73/1104. Additional smaller retroperitoneal lymph nodes are also present. Other:  No supplemental non-categorized findings. Musculoskeletal: Unremarkable IMPRESSION: 1. There is a stone in the common bile duct about 1.9 cm proximal to the ampulla, measuring 10 by 8 mm in diameter. This is causing obstruction, with moderate intrahepatic and extrahepatic biliary dilatation. There is also enhancement of the walls of the biliary tree raising the possibility of mild cholangitis. 2. Slight irregularity of the dorsal  pancreatic duct likely a manifestation of the patient's recent pancreatitis. 3. Severely atrophic right kidney. 4. Upper normal size retrocaval lymph node on the right is likely reactive. 5. Mild cardiomegaly. Radiology assistant personnel have been notified to put me in telephone contact with the referring physician or the referring physician's clinical representative in order to discuss these findings. Once this communication is established I will issue an addendum to this report for documentation purposes. Electronically Signed: By: Van Clines M.D. On: 07/25/2018 09:24   Mr Abdomen Mrcp Moise Boring Contast  Addendum Date: 07/25/2018   ADDENDUM REPORT: 07/25/2018 11:12 ADDENDUM: The original report was by Dr. Van Clines. The following addendum is by Dr. Van Clines: The impressions from the report were called by technologist Horris Latino to Dr.Duncan this morning. Dr. Damita Dunnings passed on instructions for the patient to proceed to the emergency room. I had gotten called away to do a procedure and accordingly was not able to reach Dr. Damita Dunnings myself, but I am available to discuss the case further if needed. Electronically Signed   By: Van Clines M.D.   On: 07/25/2018 11:12   Result Date: 07/25/2018 CLINICAL DATA:  Cholecystectomy on 06/19/2018.  Acute pancreatitis. EXAM: MRI ABDOMEN WITHOUT AND WITH CONTRAST (INCLUDING MRCP) TECHNIQUE: Multiplanar multisequence MR imaging of the abdomen was performed both before and after the administration of intravenous contrast. Heavily T2-weighted images of the biliary and pancreatic ducts were obtained, and three-dimensional MRCP images were rendered by post processing. CONTRAST:  9 cc Gadavist COMPARISON:  Ultrasound 05/03/2018. FINDINGS: Lower chest: Mild cardiomegaly. Hepatobiliary: Cholecystectomy with 1.8 by 1.3 cm small fluid signal intensity along the gallbladder fossa likely reflecting a small postoperative fluid collection. There is moderate  intrahepatic biliary dilatation with a distended cystic duct remnant up to 1.4 cm in diameter, and a common hepatic duct at 1.1 cm in diameter. The common bile duct is likewise dilated up to about 0.8 cm in diameter, extending down to a 10  by 8 mm in diameter filling defect in the CBD on image 68/400 compatible with stone. This is about 1.9 cm proximal to the ampulla and is believed to be causing biliary obstruction. There is mildly accentuated enhancement along the margins of the bile ducts raising the possibility of cholangitis. Pancreas: Slight irregularity of the dorsal pancreatic duct especially in the pancreatic head may be a manifestation of the patient's recent pancreatitis. No findings of pancreatic necrosis, pseudocyst, or abscess. We do not demonstrate a prominent degree of peripancreatic inflammatory stranding currently. Spleen:  Unremarkable Adrenals/Urinary Tract: Severely atrophic right kidney is likely not contributing much in the way of relative functional renal mass. Both adrenal glands are unremarkable. Small benign cyst of the left kidney lower pole. Stomach/Bowel: Unremarkable Vascular/Lymphatic: A retrocaval node measures 0.9 cm in short axis on image 73/1104. Additional smaller retroperitoneal lymph nodes are also present. Other:  No supplemental non-categorized findings. Musculoskeletal: Unremarkable IMPRESSION: 1. There is a stone in the common bile duct about 1.9 cm proximal to the ampulla, measuring 10 by 8 mm in diameter. This is causing obstruction, with moderate intrahepatic and extrahepatic biliary dilatation. There is also enhancement of the walls of the biliary tree raising the possibility of mild cholangitis. 2. Slight irregularity of the dorsal pancreatic duct likely a manifestation of the patient's recent pancreatitis. 3. Severely atrophic right kidney. 4. Upper normal size retrocaval lymph node on the right is likely reactive. 5. Mild cardiomegaly. Radiology assistant personnel  have been notified to put me in telephone contact with the referring physician or the referring physician's clinical representative in order to discuss these findings. Once this communication is established I will issue an addendum to this report for documentation purposes. Electronically Signed: By: Van Clines M.D. On: 07/25/2018 09:24    Pending Labs Unresulted Labs (From admission, onward)    Start     Ordered   07/26/18 0500  Comprehensive metabolic panel  Tomorrow morning,   R     07/25/18 1255   07/26/18 0500  CBC  Tomorrow morning,   R     07/25/18 1255   07/26/18 0500  Protime-INR  Tomorrow morning,   R     07/25/18 1255   07/26/18 0500  APTT  Tomorrow morning,   R     07/25/18 1255   07/25/18 1252  HIV antibody (Routine Testing)  Once,   R     07/25/18 1255   07/25/18 1019  Urinalysis, Routine w reflex microscopic  (ED Abdominal Pain)  ONCE - STAT,   STAT     07/25/18 1018          Vitals/Pain Today's Vitals   07/25/18 1349 07/25/18 1514 07/25/18 1515 07/25/18 1600  BP: (!) 170/110 (!) 175/107 (!) 175/107 (!) 161/113  Pulse: 73 77  78  Resp: 18 16    Temp:      SpO2: 100% 98%  98%  Weight:      PainSc:        Isolation Precautions No active isolations  Medications Medications  0.9 %  sodium chloride infusion ( Intravenous New Bag/Given 07/25/18 1350)  oxyCODONE (Oxy IR/ROXICODONE) immediate release tablet 5 mg (has no administration in time range)  ketorolac (TORADOL) 30 MG/ML injection 30 mg (has no administration in time range)  senna-docusate (Senokot-S) tablet 1 tablet (has no administration in time range)  bisacodyl (DULCOLAX) EC tablet 5 mg (has no administration in time range)  magnesium citrate solution 1 Bottle (has no administration in  time range)  ondansetron (ZOFRAN) tablet 4 mg (has no administration in time range)    Or  ondansetron (ZOFRAN) injection 4 mg (has no administration in time range)  insulin aspart (novoLOG) injection 0-9  Units (has no administration in time range)  morphine 2 MG/ML injection 1 mg (has no administration in time range)  simvastatin (ZOCOR) tablet 10 mg (has no administration in time range)  amLODipine (NORVASC) tablet 10 mg (10 mg Oral Given 07/25/18 1515)  pantoprazole (PROTONIX) EC tablet 40 mg (40 mg Oral Given 07/25/18 1516)  lisinopril (PRINIVIL,ZESTRIL) tablet 20 mg (20 mg Oral Given 07/25/18 1515)    And  hydrochlorothiazide (MICROZIDE) capsule 12.5 mg (12.5 mg Oral Given 07/25/18 1520)  sodium chloride 0.9 % bolus 1,000 mL (0 mLs Intravenous Stopped 07/25/18 1334)    Mobility walks

## 2018-07-25 NOTE — ED Triage Notes (Signed)
Per pt, states he got his gall bladder removed in October-states he started having abdominal pain last week-informed his GI MD and he set up MRI-MRI showed stone in common bile duct-informed EDP and gave her Dr Lianne Bushyuncan's number

## 2018-07-25 NOTE — H&P (Addendum)
History and Physical    Raymond Willis ZOX:[id number] DOB: 1961/03/12 DOA: 07/25/2018  PCP: Joaquim Nam, MD Patient coming from: Home  Chief Complaint: Abdominal pain  HPI: Raymond Willis is a 57 y.o. male with medical history significant of gout, essential hypertension, hyperlipidemia, recent cholecystectomy came to the hospital with complaints of abdominal pain.  Patient states he started having abdominal pain about a month ago and was diagnosed with chronic cholecystitis with gallstones.  He underwent laparoscopic cholecystectomy on 06/19/2018.  He tolerated the procedure well and did well at first but over the last 2 weeks he has noticed increasing abdominal bloating and feeling of nausea.  Last week he had few episodes of nonbloody vomiting and couple episodes of diaphoresis.  He went to go see his primary care provider last week and was noted to have elevated LFTs and total bilirubin.  He had repeat lab work again this week which showed similar findings.  Due to persistence of his symptoms and elevated LFTs he was sent to the hospital for further evaluation.MRCP showed stone in the common bile duct causing obstruction with moderate intra-and extrahepatic biliary dilation with concerns of mild cholangitis.  Severely atrophic right kidney.  LFTs including total bilirubin elevated but lipase level is normal.   Review of Systems: As per HPI otherwise 10 point review of systems negative.  Review of Systems Otherwise negative except as per HPI, including: General: Denies fever, chills, night sweats or unintended weight loss. Resp: Denies cough, wheezing, shortness of breath. Cardiac: Denies chest pain, palpitations, orthopnea, paroxysmal nocturnal dyspnea. GI: Denies abdominal pain, nausea, vomiting, diarrhea or constipation GU: Denies dysuria, frequency, hesitancy or incontinence MS: Denies muscle aches, joint pain or swelling Neuro: Denies headache, neurologic deficits (focal weakness,  numbness, tingling), abnormal gait Psych: Denies anxiety, depression, SI/HI/AVH Skin: Denies new rashes or lesions ID: Denies sick contacts, exotic exposures, travel  Past Medical History:  Diagnosis Date  . Erectile dysfunction   . Hypercholesteremia   . Hypertension   . Insomnia   . Prediabetes   . Vitamin D deficiency     Past Surgical History:  Procedure Laterality Date  . CHOLECYSTECTOMY N/A 06/19/2018   Procedure: LAPAROSCOPIC CHOLECYSTECTOMY;  Surgeon: Abigail Miyamoto, MD;  Location: [facility] OR;  Service: General;  Laterality: N/A;  . COLONOSCOPY  04/2017  . COLONOSCOPY WITH ESOPHAGOGASTRODUODENOSCOPY (EGD)    . IRRIGATION AND DEBRIDEMENT ABSCESS N/A 09/25/2014   Procedure: IRRIGATION AND DEBRIDEMENT ABSCESS;  Surgeon: Heloise Purpura, MD;  Location: [facility] OR;  Service: Urology;  Laterality: N/A;  . SCROTAL EXPLORATION N/A 09/25/2014   Procedure: SCROTUM EXPLORATION;  Surgeon: Heloise Purpura, MD;  Location: [facility] OR;  Service: Urology;  Laterality: N/A;  . TONSILLECTOMY      SOCIAL HISTORY:  reports that he has never smoked. He has never used smokeless tobacco. He reports that he does not drink alcohol or use drugs.  Allergies  Allergen Reactions  . Penicillins Hives    Has patient had a PCN reaction causing immediate rash, facial/tongue/throat swelling, SOB or lightheadedness with hypotension: unkn Has patient had a PCN reaction causing severe rash involving mucus membranes or skin necrosis: unkn Has patient had a PCN reaction that required hospitalization: unkn Has patient had a PCN reaction occurring within the last 10 years: no If all of the above answers are "NO", then may proceed with Cephalosporin use.   . Sildenafil Nausea And Vomiting    Possible cause of GI upset    FAMILY HISTORY: Family History  Problem Relation Age of Onset  . Hypertension Father   . Lung cancer Mother   . Diabetes Paternal Aunt   . Colon cancer Neg Hx   . Prostate cancer Neg Hx      Prior  to Admission medications   Medication Sig Start Date End Date Taking? Authorizing Provider  acetaminophen (TYLENOL) 500 MG tablet Take 1,000 mg by mouth every 6 (six) hours as needed for mild pain.   Yes [provider]  amLODipine (NORVASC) 10 MG tablet Take 1 tablet (10 mg total) by mouth daily. 11/27/17  Yes Joaquim Nam, MD  colchicine 0.6 MG tablet Take 1 tablet (0.6 mg total) by mouth daily as needed. Okay to fill with mitigare or colcrys if cheaper. Patient taking differently: Take 0.6 mg by mouth daily as needed (gout flare ups).  09/19/17  Yes Joaquim Nam, MD  diclofenac sodium (VOLTAREN) 1 % GEL Apply 2 g topically daily. 09/19/17  Yes Joaquim Nam, MD  lisinopril-hydrochlorothiazide (PRINZIDE,ZESTORETIC) 20-12.5 MG tablet Take 1 tablet by mouth daily. 09/19/17  Yes Joaquim Nam, MD  omeprazole (PRILOSEC) 20 MG capsule Take 1 capsule (20 mg total) by mouth 2 (two) times daily before a meal. Patient taking differently: Take 20 mg by mouth daily.  03/14/18  Yes Joaquim Nam, MD  simvastatin (ZOCOR) 10 MG tablet Take 1 tablet (10 mg total) by mouth daily at 6 PM. 09/19/17  Yes Joaquim Nam, MD  triamcinolone cream (KENALOG) 0.5 % Apply 1 application topically 2 (two) times daily as needed. Patient taking differently: Apply 1 application topically 2 (two) times daily as needed (irritation).  09/19/17  Yes Joaquim Nam, MD  ondansetron (ZOFRAN) 4 MG tablet Take 1 tablet (4 mg total) by mouth every 8 (eight) hours as needed for nausea or vomiting. Patient not taking: Reported on 07/25/2018 07/19/18   Joaquim Nam, MD    Physical Exam: Vitals:   07/25/18 1005  BP: (!) 143/96  Pulse: 73  Resp: 17  Temp: 98 F (36.7 C)  SpO2: 99%  Weight: 95.7 kg      Constitutional: NAD, calm, comfortable Eyes: PERRL, lids and conjunctivae normal ENMT: Mucous membranes are moist. Posterior pharynx clear of any exudate or lesions.Normal dentition.  Neck: normal,  supple, no masses, no thyromegaly Respiratory: clear to auscultation bilaterally, no wheezing, no crackles. Normal respiratory effort. No accessory muscle use.  Cardiovascular: Regular rate and rhythm, no murmurs / rubs / gallops. No extremity edema. 2+ pedal pulses. No carotid bruits.  Abdomen: no tenderness, no masses palpated. No hepatosplenomegaly. Bowel sounds positive.  Musculoskeletal: no clubbing / cyanosis. No joint deformity upper and lower extremities. Good ROM, no contractures. Normal muscle tone.  Skin: no rashes, lesions, ulcers. No induration Neurologic: CN 2-12 grossly intact. Sensation intact, DTR normal. Strength 5/5 in all 4.  Psychiatric: Normal judgment and insight. Alert and oriented x 3. Normal mood.     Labs on Admission: I have personally reviewed following labs and imaging studies  CBC: Recent Labs  Lab 07/19/18 1556 07/22/18 1526 07/25/18 1148  WBC 10.1 6.8 5.6  NEUTROABS 7,575 3.4 2.8  HGB 15.4 14.7 14.7  HCT 45.0 43.7 44.7  MCV 81.2 83.8 86.3  PLT 391 259.0 298   Basic Metabolic Panel: Recent Labs  Lab 07/19/18 1556 07/22/18 1526 07/25/18 1148  NA 136 140 140  K 3.9 3.9 3.3*  CL 98 101 99  CO2 24 26 29   GLUCOSE 122* 102* 94  BUN 13 14 14   CREATININE 1.18 1.50 1.08  CALCIUM 10.2 9.7 9.5   GFR: Estimated Creatinine Clearance: 84.1 mL/min (by C-G formula based on SCr of 1.08 mg/dL). Liver Function Tests: Recent Labs  Lab 07/19/18 1556 07/22/18 1526 07/25/18 1148  AST 313* 79* 194*  ALT 576* 244* 353*  ALKPHOS  --  262* 328*  BILITOT 5.7* 8.6* 12.2*  PROT 7.7 7.7 8.1  ALBUMIN  --  4.3 4.1   Recent Labs  Lab 07/19/18 1556 07/22/18 1526 07/25/18 1148  LIPASE 269* 49.0 43   No results for input(s): AMMONIA in the last 168 hours. Coagulation Profile: No results for input(s): INR, PROTIME in the last 168 hours. Cardiac Enzymes: No results for input(s): CKTOTAL, CKMB, CKMBINDEX, TROPONINI in the last 168 hours. BNP (last 3  results) No results for input(s): PROBNP in the last 8760 hours. HbA1C: No results for input(s): HGBA1C in the last 72 hours. CBG: No results for input(s): GLUCAP in the last 168 hours. Lipid Profile: No results for input(s): CHOL, HDL, LDLCALC, TRIG, CHOLHDL, LDLDIRECT in the last 72 hours. Thyroid Function Tests: No results for input(s): TSH, T4TOTAL, FREET4, T3FREE, THYROIDAB in the last 72 hours. Anemia Panel: No results for input(s): VITAMINB12, FOLATE, FERRITIN, TIBC, IRON, RETICCTPCT in the last 72 hours. Urine analysis:    Component Value Date/Time   COLORURINE YELLOW 03/09/2017 2228   APPEARANCEUR CLEAR 03/09/2017 2228   LABSPEC 1.021 03/09/2017 2228   PHURINE 6.0 03/09/2017 2228   GLUCOSEU NEGATIVE 03/09/2017 2228   HGBUR NEGATIVE 03/09/2017 2228   BILIRUBINUR 2+ 07/22/2018 1530   KETONESUR NEGATIVE 03/09/2017 2228   PROTEINUR Positive (A) 07/22/2018 1530   PROTEINUR 30 (A) 03/09/2017 2228   UROBILINOGEN 1.0 07/22/2018 1530   UROBILINOGEN 0.2 09/25/2014 1300   NITRITE Neg 07/22/2018 1530   NITRITE NEGATIVE 03/09/2017 2228   LEUKOCYTESUR Small (1+) (A) 07/22/2018 1530   Sepsis Labs: !!!!!!!!!!!!!!!!!!!!!!!!!!!!!!!!!!!!!!!!!!!! @LABRCNTIP (procalcitonin:4,lacticidven:4) )No results found for this or any previous visit (from the past 240 hour(s)).   Radiological Exams on Admission: Mr 3d Recon At Scanner  Addendum Date: 07/25/2018   ADDENDUM REPORT: 07/25/2018 11:12 ADDENDUM: The original report was by Dr. Gaylyn Rong. The following addendum is by Dr. Gaylyn Rong: The impressions from the report were called by technologist Kendal Hymen to Dr.Duncan this morning. Dr. Para March passed on instructions for the patient to proceed to the emergency room. I had gotten called away to do a procedure and accordingly was not able to reach Dr. Para March myself, but I am available to discuss the case further if needed. Electronically Signed   By: Gaylyn Rong M.D.   On:  07/25/2018 11:12   Result Date: 07/25/2018 CLINICAL DATA:  Cholecystectomy on 06/19/2018.  Acute pancreatitis. EXAM: MRI ABDOMEN WITHOUT AND WITH CONTRAST (INCLUDING MRCP) TECHNIQUE: Multiplanar multisequence MR imaging of the abdomen was performed both before and after the administration of intravenous contrast. Heavily T2-weighted images of the biliary and pancreatic ducts were obtained, and three-dimensional MRCP images were rendered by post processing. CONTRAST:  9 cc Gadavist COMPARISON:  Ultrasound 05/03/2018. FINDINGS: Lower chest: Mild cardiomegaly. Hepatobiliary: Cholecystectomy with 1.8 by 1.3 cm small fluid signal intensity along the gallbladder fossa likely reflecting a small postoperative fluid collection. There is moderate intrahepatic biliary dilatation with a distended cystic duct remnant up to 1.4 cm in diameter, and a common hepatic duct at 1.1 cm in diameter. The common bile duct is likewise dilated up to about 0.8 cm in diameter, extending down to a  10 by 8 mm in diameter filling defect in the CBD on image 68/400 compatible with stone. This is about 1.9 cm proximal to the ampulla and is believed to be causing biliary obstruction. There is mildly accentuated enhancement along the margins of the bile ducts raising the possibility of cholangitis. Pancreas: Slight irregularity of the dorsal pancreatic duct especially in the pancreatic head may be a manifestation of the patient's recent pancreatitis. No findings of pancreatic necrosis, pseudocyst, or abscess. We do not demonstrate a prominent degree of peripancreatic inflammatory stranding currently. Spleen:  Unremarkable Adrenals/Urinary Tract: Severely atrophic right kidney is likely not contributing much in the way of relative functional renal mass. Both adrenal glands are unremarkable. Small benign cyst of the left kidney lower pole. Stomach/Bowel: Unremarkable Vascular/Lymphatic: A retrocaval node measures 0.9 cm in short axis on image  73/1104. Additional smaller retroperitoneal lymph nodes are also present. Other:  No supplemental non-categorized findings. Musculoskeletal: Unremarkable IMPRESSION: 1. There is a stone in the common bile duct about 1.9 cm proximal to the ampulla, measuring 10 by 8 mm in diameter. This is causing obstruction, with moderate intrahepatic and extrahepatic biliary dilatation. There is also enhancement of the walls of the biliary tree raising the possibility of mild cholangitis. 2. Slight irregularity of the dorsal pancreatic duct likely a manifestation of the patient's recent pancreatitis. 3. Severely atrophic right kidney. 4. Upper normal size retrocaval lymph node on the right is likely reactive. 5. Mild cardiomegaly. Radiology assistant personnel have been notified to put me in telephone contact with the referring physician or the referring physician's clinical representative in order to discuss these findings. Once this communication is established I will issue an addendum to this report for documentation purposes. Electronically Signed: By: Gaylyn Rong M.D. On: 07/25/2018 09:24   Mr Abdomen Mrcp Vivien Rossetti Contast  Addendum Date: 07/25/2018   ADDENDUM REPORT: 07/25/2018 11:12 ADDENDUM: The original report was by Dr. Gaylyn Rong. The following addendum is by Dr. Gaylyn Rong: The impressions from the report were called by technologist Kendal Hymen to Dr.Duncan this morning. Dr. Para March passed on instructions for the patient to proceed to the emergency room. I had gotten called away to do a procedure and accordingly was not able to reach Dr. Para March myself, but I am available to discuss the case further if needed. Electronically Signed   By: Gaylyn Rong M.D.   On: 07/25/2018 11:12   Result Date: 07/25/2018 CLINICAL DATA:  Cholecystectomy on 06/19/2018.  Acute pancreatitis. EXAM: MRI ABDOMEN WITHOUT AND WITH CONTRAST (INCLUDING MRCP) TECHNIQUE: Multiplanar multisequence MR imaging of the abdomen was  performed both before and after the administration of intravenous contrast. Heavily T2-weighted images of the biliary and pancreatic ducts were obtained, and three-dimensional MRCP images were rendered by post processing. CONTRAST:  9 cc Gadavist COMPARISON:  Ultrasound 05/03/2018. FINDINGS: Lower chest: Mild cardiomegaly. Hepatobiliary: Cholecystectomy with 1.8 by 1.3 cm small fluid signal intensity along the gallbladder fossa likely reflecting a small postoperative fluid collection. There is moderate intrahepatic biliary dilatation with a distended cystic duct remnant up to 1.4 cm in diameter, and a common hepatic duct at 1.1 cm in diameter. The common bile duct is likewise dilated up to about 0.8 cm in diameter, extending down to a 10 by 8 mm in diameter filling defect in the CBD on image 68/400 compatible with stone. This is about 1.9 cm proximal to the ampulla and is believed to be causing biliary obstruction. There is mildly accentuated enhancement along the margins of  the bile ducts raising the possibility of cholangitis. Pancreas: Slight irregularity of the dorsal pancreatic duct especially in the pancreatic head may be a manifestation of the patient's recent pancreatitis. No findings of pancreatic necrosis, pseudocyst, or abscess. We do not demonstrate a prominent degree of peripancreatic inflammatory stranding currently. Spleen:  Unremarkable Adrenals/Urinary Tract: Severely atrophic right kidney is likely not contributing much in the way of relative functional renal mass. Both adrenal glands are unremarkable. Small benign cyst of the left kidney lower pole. Stomach/Bowel: Unremarkable Vascular/Lymphatic: A retrocaval node measures 0.9 cm in short axis on image 73/1104. Additional smaller retroperitoneal lymph nodes are also present. Other:  No supplemental non-categorized findings. Musculoskeletal: Unremarkable IMPRESSION: 1. There is a stone in the common bile duct about 1.9 cm proximal to the ampulla,  measuring 10 by 8 mm in diameter. This is causing obstruction, with moderate intrahepatic and extrahepatic biliary dilatation. There is also enhancement of the walls of the biliary tree raising the possibility of mild cholangitis. 2. Slight irregularity of the dorsal pancreatic duct likely a manifestation of the patient's recent pancreatitis. 3. Severely atrophic right kidney. 4. Upper normal size retrocaval lymph node on the right is likely reactive. 5. Mild cardiomegaly. Radiology assistant personnel have been notified to put me in telephone contact with the referring physician or the referring physician's clinical representative in order to discuss these findings. Once this communication is established I will issue an addendum to this report for documentation purposes. Electronically Signed: By: Gaylyn RongWalter  Liebkemann M.D. On: 07/25/2018 09:24     All images have been reviewed by me personally.   Assessment/Plan Principal Problem:   Abdominal pain Active Problems:   Essential hypertension   Hyperlipidemia   Gout   GERD (gastroesophageal reflux disease)   Epigastric pain   LFT elevation   Obstructive gallstone with transaminitis and elevated total bilirubin Generalized Abdominal Pain -Admit the patient to the hospital for further care.  MRI findings are consistent with cholelithiasis with obstruction causing intra-and extrahepatic dilatation.  Possible acute cholangitis but patient denies any fevers, chills.  No leukocytosis.  Hold off on antibiotics at this time. -Pain control, IV fluids.  Diet as tolerated today, n.p.o. past midnight -GI has been consulted.Trend LFTs  Essential hypertension -Resume his home medications including amlodipine, lisinopril and hydrochlorothiazide  GERD -PPI-Protonix 40 mg daily  Hyperlipidemia -Zocor 10 mg daily  Hx Gout- On Colchicine  DVT prophylaxis: SCDs Code Status: Full code Family Communication: Wife at bedside Disposition Plan: To be  determined Consults called: Gastroenterology, Dr. Dulce Sellarutlaw Admission status: Admit patient to medical surgical floor for further care and management.   Time Spent: 65 minutes.  >50% of the time was devoted to discussing the patients care, assessment, plan and disposition with other care givers along with counseling the patient about the risks and benefits of treatment.    Alaria Oconnor Joline Maxcyhirag Leelan Rajewski MD [facility] Pager [phone]  If 7PM-7AM, please contact night-coverage [email or web address] Password [email or web address]  07/25/2018, 1:49 PM

## 2018-07-25 NOTE — Consult Note (Signed)
[facility] Consultation Note  Referring Provider: Dr. Nelson [facility] Primary Care Physician:  Raymond Raymond, Raymond S, MD Primary Gastroenterologist:  Dr. Willis ModenaWilliam Chene Raymond  Reason for Consultation:  Elevated liver enzymes, bile duct stone  HPI: Raymond Raymond is Willis 57 y.o. male admitted for above reason.  Had cholecystectomy about one month ago for chronic cholecystitis; LFTs normal pre-operatively and no intraoperative cholangiogram was done.  Couple weeks ago, began having abdominal pain; lipase elevated and LFTs elevated in mixed pattern.  No abdominal pain for Willis couple weeks, but had MRCP yesterday which showed stone in mid-CBD with upstream intra- and extrahepatic biliary ductal dilatation and cystic duct stump dilatation.     Past Medical History:  Diagnosis Date  . Erectile dysfunction   . Hypercholesteremia   . Hypertension   . Insomnia   . Prediabetes   . Vitamin D deficiency     Past Surgical History:  Procedure Laterality Date  . CHOLECYSTECTOMY N/Willis 06/19/2018   Procedure: LAPAROSCOPIC CHOLECYSTECTOMY;  Surgeon: Raymond Raymond, Douglas, MD;  [facility];  Service: General;  Laterality: N/Willis;  . COLONOSCOPY  04/2017  . COLONOSCOPY WITH ESOPHAGOGASTRODUODENOSCOPY (EGD)    . IRRIGATION AND DEBRIDEMENT ABSCESS N/Willis 09/25/2014   Procedure: IRRIGATION AND DEBRIDEMENT ABSCESS;  Surgeon: Raymond PurpuraLester Borden, MD;  [facility];  Service: Urology;  Laterality: N/Willis;  . SCROTAL EXPLORATION N/Willis 09/25/2014   Procedure: SCROTUM EXPLORATION;  Surgeon: Raymond PurpuraLester Borden, MD;  [facility];  Service: Urology;  Laterality: N/Willis;  . TONSILLECTOMY      Prior to Admission medications   Medication Sig Start Date End Date Taking? Authorizing Provider  acetaminophen (TYLENOL) 500 MG tablet Take 1,000 mg by mouth every 6 (six) hours as needed for mild pain.   Yes [provider]  amLODipine (NORVASC) 10 MG tablet Take 1 tablet (10 mg total) by mouth daily. 11/27/17  Yes Raymond Raymond, Raymond S, MD   colchicine 0.6 MG tablet Take 1 tablet (0.6 mg total) by mouth daily as needed. Okay to fill with mitigare or colcrys if cheaper. Patient taking differently: Take 0.6 mg by mouth daily as needed (gout flare ups).  09/19/17  Yes Raymond Raymond, Raymond S, MD  diclofenac sodium (VOLTAREN) 1 % GEL Apply 2 g topically daily. 09/19/17  Yes Raymond Raymond, Raymond S, MD  lisinopril-hydrochlorothiazide (PRINZIDE,ZESTORETIC) 20-12.5 MG tablet Take 1 tablet by mouth daily. 09/19/17  Yes Raymond Raymond, Raymond S, MD  omeprazole (PRILOSEC) 20 MG capsule Take 1 capsule (20 mg total) by mouth 2 (two) times daily before Willis meal. Patient taking differently: Take 20 mg by mouth daily.  03/14/18  Yes Raymond Raymond, Raymond S, MD  simvastatin (ZOCOR) 10 MG tablet Take 1 tablet (10 mg total) by mouth daily at 6 PM. 09/19/17  Yes Raymond Raymond, Raymond S, MD  triamcinolone cream (KENALOG) 0.5 % Apply 1 application topically 2 (two) times daily as needed. Patient taking differently: Apply 1 application topically 2 (two) times daily as needed (irritation).  09/19/17  Yes Raymond Raymond, Raymond S, MD  ondansetron (ZOFRAN) 4 MG tablet Take 1 tablet (4 mg total) by mouth every 8 (eight) hours as needed for nausea or vomiting. Patient not taking: Reported on 07/25/2018 07/19/18   Raymond Raymond, Raymond S, MD    Current Facility-Administered Medications  Medication Dose Route Frequency Provider Last Rate Last Dose  . 0.9 %  sodium chloride infusion   Intravenous Continuous Amin, Raymond Chirag, MD 75 mL/hr at 07/25/18 1350    . amLODipine (NORVASC) tablet 10 mg  10 mg Oral  Daily Amin, Raymond Chirag, MD   10 mg at 07/25/18 1515  . bisacodyl (DULCOLAX) EC tablet 5 mg  5 mg Oral Daily PRN Amin, Raymond Chirag, MD      . lisinopril (PRINIVIL,ZESTRIL) tablet 20 mg  20 mg Oral Daily Danford Bad, RPH   20 mg at 07/25/18 1515   And  . hydrochlorothiazide (MICROZIDE) capsule 12.5 mg  12.5 mg Oral Daily Raymond Raymond, RPH   12.5 mg at 07/25/18 1520  . insulin aspart (novoLOG) injection 0-9  Units  0-9 Units Subcutaneous TID WC Amin, Raymond Chirag, MD      . ketorolac (TORADOL) 30 MG/ML injection 30 mg  30 mg Intravenous Q6H PRN Amin, Raymond Chirag, MD      . magnesium citrate solution 1 Bottle  1 Bottle Oral Once PRN Amin, Raymond Chirag, MD      . morphine 2 MG/ML injection 1 mg  1 mg Intravenous Q4H PRN Amin, Raymond Chirag, MD      . ondansetron (ZOFRAN) tablet 4 mg  4 mg Oral Q6H PRN Amin, Raymond Chirag, MD       Or  . ondansetron (ZOFRAN) injection 4 mg  4 mg Intravenous Q6H PRN Amin, Raymond Chirag, MD      . oxyCODONE (Oxy IR/ROXICODONE) immediate release tablet 5 mg  5 mg Oral Q4H PRN Amin, Raymond Chirag, MD      . pantoprazole (PROTONIX) EC tablet 40 mg  40 mg Oral Daily Amin, Raymond Chirag, MD   40 mg at 07/25/18 1516  . senna-docusate (Senokot-Willis) tablet 1 tablet  1 tablet Oral QHS PRN Amin, Raymond Chirag, MD      . simvastatin (ZOCOR) tablet 10 mg  10 mg Oral q1800 Amin, Raymond Halt, MD       Current Outpatient Medications  Medication Sig Dispense Refill  . acetaminophen (TYLENOL) 500 MG tablet Take 1,000 mg by mouth every 6 (six) hours as needed for mild pain.    Marland Kitchen amLODipine (NORVASC) 10 MG tablet Take 1 tablet (10 mg total) by mouth daily. 90 tablet 3  . colchicine 0.6 MG tablet Take 1 tablet (0.6 mg total) by mouth daily as needed. Okay to fill with mitigare or colcrys if cheaper. (Patient taking differently: Take 0.6 mg by mouth daily as needed (gout flare ups). ) 30 tablet 1  . diclofenac sodium (VOLTAREN) 1 % GEL Apply 2 g topically daily. 300 g 3  . lisinopril-hydrochlorothiazide (PRINZIDE,ZESTORETIC) 20-12.5 MG tablet Take 1 tablet by mouth daily. 90 tablet 3  . omeprazole (PRILOSEC) 20 MG capsule Take 1 capsule (20 mg total) by mouth 2 (two) times daily before Willis meal. (Patient taking differently: Take 20 mg by mouth daily. )    . simvastatin (ZOCOR) 10 MG tablet Take 1 tablet (10 mg total) by mouth daily at 6 PM. 90 tablet 3  . triamcinolone cream (KENALOG) 0.5 % Apply 1  application topically 2 (two) times daily as needed. (Patient taking differently: Apply 1 application topically 2 (two) times daily as needed (irritation). ) 30 g 1  . ondansetron (ZOFRAN) 4 MG tablet Take 1 tablet (4 mg total) by mouth every 8 (eight) hours as needed for nausea or vomiting. (Patient not taking: Reported on 07/25/2018) 20 tablet 0    Allergies as of 07/25/2018 - Review Complete 07/25/2018  Allergen Reaction Noted  . Penicillins Hives 09/25/2014  . Sildenafil Nausea And Vomiting 09/20/2017    Family History  Problem Relation Age of Onset  .  Hypertension Father   . Lung cancer Mother   . Diabetes Paternal Aunt   . Colon cancer Neg Hx   . Prostate cancer Neg Hx     Social History   Socioeconomic History  . Marital status: Married    Spouse name: Not on file  . Number of children: Not on file  . Years of education: Not on file  . Highest education level: Not on file  Occupational History  . Not on file  Social Needs  . Financial resource strain: Not on file  . Food insecurity:    Worry: Not on file    Inability: Not on file  . Transportation needs:    Medical: Not on file    Non-medical: Not on file  Tobacco Use  . Smoking status: Never Smoker  . Smokeless tobacco: Never Used  Substance and Sexual Activity  . Alcohol use: No    Alcohol/week: 0.0 standard drinks  . Drug use: No  . Sexual activity: Never  Lifestyle  . Physical activity:    Days per week: Not on file    Minutes per session: Not on file  . Stress: Not on file  Relationships  . Social connections:    Talks on phone: Not on file    Gets together: Not on file    Attends religious service: Not on file    Active member of club or organization: Not on file    Attends meetings of clubs or organizations: Not on file    Relationship status: Not on file  . Intimate partner violence:    Fear of current or ex partner: Not on file    Emotionally abused: Not on file    Physically abused: Not on  file    Forced sexual activity: Not on file  Other Topics Concern  . Not on file  Social History Narrative   Married 2016   4 children, 3 grandchildren.   Works at [employer].   Has Willis '68 [place], '87 [employer] [place] [employer]   enjoys spending time with his church.     Review of Systems: As per HPI, all others negative  Physical Exam: Vital signs in last 24 hours: Temp:  [98 F (36.7 C)] 98 F (36.7 C) (11/21 1005) Pulse Rate:  [73-77] 77 (11/21 1514) Resp:  [16-18] 16 (11/21 1514) BP: (143-175)/(96-110) 175/107 (11/21 1515) SpO2:  [98 %-100 %] 98 % (11/21 1514) Weight:  [95.7 kg] 95.7 kg (11/21 1005)   General:   Alert,  Well-developed, well-nourished, pleasant and cooperative in NAD Head:  Normocephalic and atraumatic. Eyes:  Scleral icterus bilaterally   Conjunctiva pink. Ears:  Normal auditory acuity. Nose:  No deformity, discharge,  or lesions. Mouth:  No deformity or lesions.  Oropharynx pink & moist. Neck:  Supple; no masses or thyromegaly. Lungs:  Clear throughout to auscultation.   No wheezes, crackles, or rhonchi. No acute distress. Heart:  Regular rate and rhythm; no murmurs, clicks, rubs,  or gallops. Abdomen:  Soft, nontender and nondistended. No masses, hepatosplenomegaly or hernias noted. Normal bowel sounds, without guarding, and without rebound.     Msk:  Symmetrical without gross deformities. Normal posture. Pulses:  Normal pulses noted. Extremities:  Without clubbing or edema. Neurologic:  Alert and  oriented x4;  grossly normal neurologically. Skin:  Intact without significant lesions or rashes. Psych:  Alert and cooperative. Normal mood and affect.   Lab Results: Recent Labs    07/25/18 1148  WBC 5.6  HGB 14.7  HCT 44.7  PLT 298   BMET Recent Labs    07/25/18 1148  NA 140  K 3.3*  CL 99  CO2 29  GLUCOSE 94  BUN 14  CREATININE 1.08  CALCIUM 9.5   LFT Recent Labs    07/25/18 1148  PROT 8.1  ALBUMIN 4.1  AST 194*  ALT  353*  ALKPHOS 328*  BILITOT 12.2*   PT/INR No results for input(Willis): LABPROT, INR in the last 72 hours.  Studies/Results: Mr 3d Recon At Scanner  Addendum Date: 07/25/2018   ADDENDUM REPORT: 07/25/2018 11:12 ADDENDUM: The original report was by Dr. Gaylyn Rong. The following addendum is by Dr. Gaylyn Rong: The impressions from the report were called by technologist Kendal Hymen to Dr.Duncan this morning. Dr. Para March passed on instructions for the patient to proceed to the emergency room. I had gotten called away to do Willis procedure and accordingly was not able to reach Dr. Para March myself, but I am available to discuss the case further if needed. Electronically Signed   By: Gaylyn Rong M.D.   On: 07/25/2018 11:12   Result Date: 07/25/2018 CLINICAL DATA:  Cholecystectomy on 06/19/2018.  Acute pancreatitis. EXAM: MRI ABDOMEN WITHOUT AND WITH CONTRAST (INCLUDING MRCP) TECHNIQUE: Multiplanar multisequence MR imaging of the abdomen was performed both before and after the administration of intravenous contrast. Heavily T2-weighted images of the biliary and pancreatic ducts were obtained, and three-dimensional MRCP images were rendered by post processing. CONTRAST:  9 cc Gadavist COMPARISON:  Ultrasound 05/03/2018. FINDINGS: Lower chest: Mild cardiomegaly. Hepatobiliary: Cholecystectomy with 1.8 by 1.3 cm small fluid signal intensity along the gallbladder fossa likely reflecting Willis small postoperative fluid collection. There is moderate intrahepatic biliary dilatation with Willis distended cystic duct remnant up to 1.4 cm in diameter, and Willis common hepatic duct at 1.1 cm in diameter. The common bile duct is likewise dilated up to about 0.8 cm in diameter, extending down to Willis 10 by 8 mm in diameter filling defect in the CBD on image 68/400 compatible with stone. This is about 1.9 cm proximal to the ampulla and is believed to be causing biliary obstruction. There is mildly accentuated enhancement along the  margins of the bile ducts raising the possibility of cholangitis. Pancreas: Slight irregularity of the dorsal pancreatic duct especially in the pancreatic head may be Willis manifestation of the patient'Willis recent pancreatitis. No findings of pancreatic necrosis, pseudocyst, or abscess. We do not demonstrate Willis prominent degree of peripancreatic inflammatory stranding currently. Spleen:  Unremarkable Adrenals/Urinary Tract: Severely atrophic right kidney is likely not contributing much in the way of relative functional renal mass. Both adrenal glands are unremarkable. Small benign cyst of the left kidney lower pole. Stomach/Bowel: Unremarkable Vascular/Lymphatic: Willis retrocaval node measures 0.9 cm in short axis on image 73/1104. Additional smaller retroperitoneal lymph nodes are also present. Other:  No supplemental non-categorized findings. Musculoskeletal: Unremarkable IMPRESSION: 1. There is Willis stone in the common bile duct about 1.9 cm proximal to the ampulla, measuring 10 by 8 mm in diameter. This is causing obstruction, with moderate intrahepatic and extrahepatic biliary dilatation. There is also enhancement of the walls of the biliary tree raising the possibility of mild cholangitis. 2. Slight irregularity of the dorsal pancreatic duct likely Willis manifestation of the patient'Willis recent pancreatitis. 3. Severely atrophic right kidney. 4. Upper normal size retrocaval lymph node on the right is likely reactive. 5. Mild cardiomegaly. Radiology assistant personnel have been notified to put me in telephone  contact with the referring physician or the referring physician'Willis clinical representative in order to discuss these findings. Once this communication is established I will issue an addendum to this report for documentation purposes. Electronically Signed: By: Gaylyn Rong M.D. On: 07/25/2018 09:24   Mr Abdomen Mrcp Vivien Rossetti Contast  Addendum Date: 07/25/2018   ADDENDUM REPORT: 07/25/2018 11:12 ADDENDUM: The original  report was by Dr. Gaylyn Rong. The following addendum is by Dr. Gaylyn Rong: The impressions from the report were called by technologist Kendal Hymen to Dr.Duncan this morning. Dr. Para March passed on instructions for the patient to proceed to the emergency room. I had gotten called away to do Willis procedure and accordingly was not able to reach Dr. Para March myself, but I am available to discuss the case further if needed. Electronically Signed   By: Gaylyn Rong M.D.   On: 07/25/2018 11:12   Result Date: 07/25/2018 CLINICAL DATA:  Cholecystectomy on 06/19/2018.  Acute pancreatitis. EXAM: MRI ABDOMEN WITHOUT AND WITH CONTRAST (INCLUDING MRCP) TECHNIQUE: Multiplanar multisequence MR imaging of the abdomen was performed both before and after the administration of intravenous contrast. Heavily T2-weighted images of the biliary and pancreatic ducts were obtained, and three-dimensional MRCP images were rendered by post processing. CONTRAST:  9 cc Gadavist COMPARISON:  Ultrasound 05/03/2018. FINDINGS: Lower chest: Mild cardiomegaly. Hepatobiliary: Cholecystectomy with 1.8 by 1.3 cm small fluid signal intensity along the gallbladder fossa likely reflecting Willis small postoperative fluid collection. There is moderate intrahepatic biliary dilatation with Willis distended cystic duct remnant up to 1.4 cm in diameter, and Willis common hepatic duct at 1.1 cm in diameter. The common bile duct is likewise dilated up to about 0.8 cm in diameter, extending down to Willis 10 by 8 mm in diameter filling defect in the CBD on image 68/400 compatible with stone. This is about 1.9 cm proximal to the ampulla and is believed to be causing biliary obstruction. There is mildly accentuated enhancement along the margins of the bile ducts raising the possibility of cholangitis. Pancreas: Slight irregularity of the dorsal pancreatic duct especially in the pancreatic head may be Willis manifestation of the patient'Willis recent pancreatitis. No findings of  pancreatic necrosis, pseudocyst, or abscess. We do not demonstrate Willis prominent degree of peripancreatic inflammatory stranding currently. Spleen:  Unremarkable Adrenals/Urinary Tract: Severely atrophic right kidney is likely not contributing much in the way of relative functional renal mass. Both adrenal glands are unremarkable. Small benign cyst of the left kidney lower pole. Stomach/Bowel: Unremarkable Vascular/Lymphatic: Willis retrocaval node measures 0.9 cm in short axis on image 73/1104. Additional smaller retroperitoneal lymph nodes are also present. Other:  No supplemental non-categorized findings. Musculoskeletal: Unremarkable IMPRESSION: 1. There is Willis stone in the common bile duct about 1.9 cm proximal to the ampulla, measuring 10 by 8 mm in diameter. This is causing obstruction, with moderate intrahepatic and extrahepatic biliary dilatation. There is also enhancement of the walls of the biliary tree raising the possibility of mild cholangitis. 2. Slight irregularity of the dorsal pancreatic duct likely Willis manifestation of the patient'Willis recent pancreatitis. 3. Severely atrophic right kidney. 4. Upper normal size retrocaval lymph node on the right is likely reactive. 5. Mild cardiomegaly. Radiology assistant personnel have been notified to put me in telephone contact with the referring physician or the referring physician'Willis clinical representative in order to discuss these findings. Once this communication is established I will issue an addendum to this report for documentation purposes. Electronically Signed: By: Gaylyn Rong M.D. On: 07/25/2018 09:24  Impression:  1.  Abdominal pain, resolved. Prior elevated lipase, but no active pancreatitis seen on today'Willis MRCP. 2.  Cholelithiasis, cholecystectomy about one month ago. 3.  New onset elevated liver enzymes (normal pre-operatively). 4.  Abnormal MRCP:  Stone mid CBD with cystic duct stump and upstream intra- and extrahepatic biliary ductal  dilatation.  Plan:  1.  Clear liquid diet, NPO after midnight. 2.  Endoscopic retrograde cholangiography with hopeful biliary sphincterotomy and bile duct stone extraction. 3.  Risks (up to and including bleeding, infection, perforation, pancreatitis that can be complicated by infected necrosis and death), benefits (removal of stones, alleviating blockage, decreasing risk of cholangitis or choledocholithiasis-related pancreatitis), and alternatives (watchful waiting, percutaneous transhepatic cholangiography) of ERCP were explained to patient/family in detail and patient elects to proceed.   LOS: 0 days   Joniel Graumann M  07/25/2018, 3:46 PM  Cell [phone] If no answer or after 5 PM call [phone]

## 2018-07-25 NOTE — ED Provider Notes (Signed)
[facility]-EMERGENCY DEPT Provider Note   CSN: [id number] Arrival date & time: 07/25/18  8295     History   Chief Complaint Chief Complaint  Patient presents with  . Cholelithiasis    HPI Raymond Willis is a 57 y.o. male.  Pt presents to the ED today with abdominal pain.  He had his gallbladder removed by Dr. Magnus Ivan on 10/16.  He has had continued abd pain, so his pcp in d/w his GI doctor (Dr. Dulce Sellar at [place]) ordered a MRCP which was done today.  It showed a common bile duct stone with obstruction.  Pt has not been eating much because that has been causing him upper abd pain.     Past Medical History:  Diagnosis Date  . Erectile dysfunction   . Hypercholesteremia   . Hypertension   . Insomnia   . Prediabetes   . Vitamin D deficiency     Patient Active Problem List   Diagnosis Date Noted  . LFT elevation 07/23/2018  . Epigastric pain 07/21/2018  . Abdominal bloating 03/17/2018  . Joint pain 09/20/2017  . Dry skin 09/20/2017  . GERD (gastroesophageal reflux disease) 03/08/2017  . Routine general medical examination at a health care facility 07/06/2016  . Advance care planning 07/06/2016  . Gout 05/22/2016  . Erectile dysfunction 12/30/2015  . Essential hypertension 12/30/2015  . Hyperlipidemia 12/30/2015    Past Surgical History:  Procedure Laterality Date  . CHOLECYSTECTOMY N/A 06/19/2018   Procedure: LAPAROSCOPIC CHOLECYSTECTOMY;  Surgeon: Abigail Miyamoto, MD;  Location: [facility] OR;  Service: General;  Laterality: N/A;  . COLONOSCOPY  04/2017  . COLONOSCOPY WITH ESOPHAGOGASTRODUODENOSCOPY (EGD)    . IRRIGATION AND DEBRIDEMENT ABSCESS N/A 09/25/2014   Procedure: IRRIGATION AND DEBRIDEMENT ABSCESS;  Surgeon: Heloise Purpura, MD;  Location: [facility] OR;  Service: Urology;  Laterality: N/A;  . SCROTAL EXPLORATION N/A 09/25/2014   Procedure: SCROTUM EXPLORATION;  Surgeon: Heloise Purpura, MD;  Location: [facility] OR;  Service: Urology;  Laterality: N/A;  .  TONSILLECTOMY          Home Medications    Prior to Admission medications   Medication Sig Start Date End Date Taking? Authorizing Provider  acetaminophen (TYLENOL) 500 MG tablet Take 1,000 mg by mouth every 6 (six) hours as needed for mild pain.   Yes [provider]  amLODipine (NORVASC) 10 MG tablet Take 1 tablet (10 mg total) by mouth daily. 11/27/17  Yes Joaquim Nam, MD  colchicine 0.6 MG tablet Take 1 tablet (0.6 mg total) by mouth daily as needed. Okay to fill with mitigare or colcrys if cheaper. Patient taking differently: Take 0.6 mg by mouth daily as needed (gout flare ups).  09/19/17  Yes Joaquim Nam, MD  diclofenac sodium (VOLTAREN) 1 % GEL Apply 2 g topically daily. 09/19/17  Yes Joaquim Nam, MD  lisinopril-hydrochlorothiazide (PRINZIDE,ZESTORETIC) 20-12.5 MG tablet Take 1 tablet by mouth daily. 09/19/17  Yes Joaquim Nam, MD  omeprazole (PRILOSEC) 20 MG capsule Take 1 capsule (20 mg total) by mouth 2 (two) times daily before a meal. Patient taking differently: Take 20 mg by mouth daily.  03/14/18  Yes Joaquim Nam, MD  simvastatin (ZOCOR) 10 MG tablet Take 1 tablet (10 mg total) by mouth daily at 6 PM. 09/19/17  Yes Joaquim Nam, MD  triamcinolone cream (KENALOG) 0.5 % Apply 1 application topically 2 (two) times daily as needed. Patient taking differently: Apply 1 application topically 2 (two) times daily as needed (  irritation).  09/19/17  Yes Joaquim Nam, MD  acetaminophen (TYLENOL) 325 MG tablet Take 650 mg by mouth every 6 (six) hours as needed for moderate pain.    [provider]  cholestyramine (QUESTRAN) 4 g packet Take 4 g by mouth 3 (three) times daily with meals.    [provider]  ondansetron (ZOFRAN) 4 MG tablet Take 1 tablet (4 mg total) by mouth every 8 (eight) hours as needed for nausea or vomiting. Patient not taking: Reported on 07/25/2018 07/19/18   Joaquim Nam, MD    Family History Family History    Problem Relation Age of Onset  . Hypertension Father   . Lung cancer Mother   . Diabetes Paternal Aunt   . Colon cancer Neg Hx   . Prostate cancer Neg Hx     Social History Social History   Tobacco Use  . Smoking status: Never Smoker  . Smokeless tobacco: Never Used  Substance Use Topics  . Alcohol use: No    Alcohol/week: 0.0 standard drinks  . Drug use: No     Allergies   Penicillins and Sildenafil   Review of Systems Review of Systems  Gastrointestinal: Positive for abdominal pain.  All other systems reviewed and are negative.    Physical Exam Updated Vital Signs BP (!) 143/96   Pulse 73   Temp 98 F (36.7 C)   Resp 17   Wt 95.7 kg   SpO2 99%   BMI 32.46 kg/m   Physical Exam  Constitutional: He is oriented to person, place, and time. He appears well-developed and well-nourished.  HENT:  Head: Normocephalic and atraumatic.  Right Ear: External ear normal.  Left Ear: External ear normal.  Nose: Nose normal.  Mouth/Throat: Oropharynx is clear and moist.  Eyes: Pupils are equal, round, and reactive to light. Conjunctivae and EOM are normal. Scleral icterus is present.  Neck: Normal range of motion. Neck supple.  Cardiovascular: Normal rate, regular rhythm, normal heart sounds and intact distal pulses.  Pulmonary/Chest: Effort normal and breath sounds normal.  Abdominal: Soft. Bowel sounds are normal. There is tenderness in the epigastric area.  Musculoskeletal: Normal range of motion.  Neurological: He is alert and oriented to person, place, and time.  Skin: Skin is warm. Capillary refill takes less than 2 seconds.  Psychiatric: He has a normal mood and affect. His behavior is normal. Judgment and thought content normal.  Nursing note and vitals reviewed.    ED Treatments / Results  Labs (all labs ordered are listed, but only abnormal results are displayed) Labs Reviewed  COMPREHENSIVE METABOLIC PANEL - Abnormal; Notable for the following  components:      Result Value   Potassium 3.3 (*)    AST 194 (*)    ALT 353 (*)    Alkaline Phosphatase 328 (*)    Total Bilirubin 12.2 (*)    All other components within normal limits  CBC WITH DIFFERENTIAL/PLATELET  LIPASE, BLOOD  URINALYSIS, ROUTINE W REFLEX MICROSCOPIC    EKG None  Radiology Mr 3d Recon At Scanner  Addendum Date: 07/25/2018   ADDENDUM REPORT: 07/25/2018 11:12 ADDENDUM: The original report was by Dr. Gaylyn Rong. The following addendum is by Dr. Gaylyn Rong: The impressions from the report were called by technologist Kendal Hymen to Dr.Duncan this morning. Dr. Para March passed on instructions for the patient to proceed to the emergency room. I had gotten called away to do a procedure and accordingly was not able to reach Dr.  Para MarchDuncan myself, but I am available to discuss the case further if needed. Electronically Signed   By: Gaylyn RongWalter  Liebkemann M.D.   On: 07/25/2018 11:12   Result Date: 07/25/2018 CLINICAL DATA:  Cholecystectomy on 06/19/2018.  Acute pancreatitis. EXAM: MRI ABDOMEN WITHOUT AND WITH CONTRAST (INCLUDING MRCP) TECHNIQUE: Multiplanar multisequence MR imaging of the abdomen was performed both before and after the administration of intravenous contrast. Heavily T2-weighted images of the biliary and pancreatic ducts were obtained, and three-dimensional MRCP images were rendered by post processing. CONTRAST:  9 cc Gadavist COMPARISON:  Ultrasound 05/03/2018. FINDINGS: Lower chest: Mild cardiomegaly. Hepatobiliary: Cholecystectomy with 1.8 by 1.3 cm small fluid signal intensity along the gallbladder fossa likely reflecting a small postoperative fluid collection. There is moderate intrahepatic biliary dilatation with a distended cystic duct remnant up to 1.4 cm in diameter, and a common hepatic duct at 1.1 cm in diameter. The common bile duct is likewise dilated up to about 0.8 cm in diameter, extending down to a 10 by 8 mm in diameter filling defect in the CBD on  image 68/400 compatible with stone. This is about 1.9 cm proximal to the ampulla and is believed to be causing biliary obstruction. There is mildly accentuated enhancement along the margins of the bile ducts raising the possibility of cholangitis. Pancreas: Slight irregularity of the dorsal pancreatic duct especially in the pancreatic head may be a manifestation of the patient's recent pancreatitis. No findings of pancreatic necrosis, pseudocyst, or abscess. We do not demonstrate a prominent degree of peripancreatic inflammatory stranding currently. Spleen:  Unremarkable Adrenals/Urinary Tract: Severely atrophic right kidney is likely not contributing much in the way of relative functional renal mass. Both adrenal glands are unremarkable. Small benign cyst of the left kidney lower pole. Stomach/Bowel: Unremarkable Vascular/Lymphatic: A retrocaval node measures 0.9 cm in short axis on image 73/1104. Additional smaller retroperitoneal lymph nodes are also present. Other:  No supplemental non-categorized findings. Musculoskeletal: Unremarkable IMPRESSION: 1. There is a stone in the common bile duct about 1.9 cm proximal to the ampulla, measuring 10 by 8 mm in diameter. This is causing obstruction, with moderate intrahepatic and extrahepatic biliary dilatation. There is also enhancement of the walls of the biliary tree raising the possibility of mild cholangitis. 2. Slight irregularity of the dorsal pancreatic duct likely a manifestation of the patient's recent pancreatitis. 3. Severely atrophic right kidney. 4. Upper normal size retrocaval lymph node on the right is likely reactive. 5. Mild cardiomegaly. Radiology assistant personnel have been notified to put me in telephone contact with the referring physician or the referring physician's clinical representative in order to discuss these findings. Once this communication is established I will issue an addendum to this report for documentation purposes. Electronically  Signed: By: Gaylyn RongWalter  Liebkemann M.D. On: 07/25/2018 09:24   Mr Abdomen Mrcp Vivien RossettiW Wo Contast  Addendum Date: 07/25/2018   ADDENDUM REPORT: 07/25/2018 11:12 ADDENDUM: The original report was by Dr. Gaylyn RongWalter Liebkemann. The following addendum is by Dr. Gaylyn RongWalter Liebkemann: The impressions from the report were called by technologist Kendal HymenBonnie to Dr.Duncan this morning. Dr. Para Marchuncan passed on instructions for the patient to proceed to the emergency room. I had gotten called away to do a procedure and accordingly was not able to reach Dr. Para Marchuncan myself, but I am available to discuss the case further if needed. Electronically Signed   By: Gaylyn RongWalter  Liebkemann M.D.   On: 07/25/2018 11:12   Result Date: 07/25/2018 CLINICAL DATA:  Cholecystectomy on 06/19/2018.  Acute pancreatitis. EXAM: MRI  ABDOMEN WITHOUT AND WITH CONTRAST (INCLUDING MRCP) TECHNIQUE: Multiplanar multisequence MR imaging of the abdomen was performed both before and after the administration of intravenous contrast. Heavily T2-weighted images of the biliary and pancreatic ducts were obtained, and three-dimensional MRCP images were rendered by post processing. CONTRAST:  9 cc Gadavist COMPARISON:  Ultrasound 05/03/2018. FINDINGS: Lower chest: Mild cardiomegaly. Hepatobiliary: Cholecystectomy with 1.8 by 1.3 cm small fluid signal intensity along the gallbladder fossa likely reflecting a small postoperative fluid collection. There is moderate intrahepatic biliary dilatation with a distended cystic duct remnant up to 1.4 cm in diameter, and a common hepatic duct at 1.1 cm in diameter. The common bile duct is likewise dilated up to about 0.8 cm in diameter, extending down to a 10 by 8 mm in diameter filling defect in the CBD on image 68/400 compatible with stone. This is about 1.9 cm proximal to the ampulla and is believed to be causing biliary obstruction. There is mildly accentuated enhancement along the margins of the bile ducts raising the possibility of cholangitis.  Pancreas: Slight irregularity of the dorsal pancreatic duct especially in the pancreatic head may be a manifestation of the patient's recent pancreatitis. No findings of pancreatic necrosis, pseudocyst, or abscess. We do not demonstrate a prominent degree of peripancreatic inflammatory stranding currently. Spleen:  Unremarkable Adrenals/Urinary Tract: Severely atrophic right kidney is likely not contributing much in the way of relative functional renal mass. Both adrenal glands are unremarkable. Small benign cyst of the left kidney lower pole. Stomach/Bowel: Unremarkable Vascular/Lymphatic: A retrocaval node measures 0.9 cm in short axis on image 73/1104. Additional smaller retroperitoneal lymph nodes are also present. Other:  No supplemental non-categorized findings. Musculoskeletal: Unremarkable IMPRESSION: 1. There is a stone in the common bile duct about 1.9 cm proximal to the ampulla, measuring 10 by 8 mm in diameter. This is causing obstruction, with moderate intrahepatic and extrahepatic biliary dilatation. There is also enhancement of the walls of the biliary tree raising the possibility of mild cholangitis. 2. Slight irregularity of the dorsal pancreatic duct likely a manifestation of the patient's recent pancreatitis. 3. Severely atrophic right kidney. 4. Upper normal size retrocaval lymph node on the right is likely reactive. 5. Mild cardiomegaly. Radiology assistant personnel have been notified to put me in telephone contact with the referring physician or the referring physician's clinical representative in order to discuss these findings. Once this communication is established I will issue an addendum to this report for documentation purposes. Electronically Signed: By: Gaylyn Rong M.D. On: 07/25/2018 09:24    Procedures Procedures (including critical care time)  Medications Ordered in ED Medications  sodium chloride 0.9 % bolus 1,000 mL (1,000 mLs Intravenous New Bag/Given 07/25/18  1206)     Initial Impression / Assessment and Plan / ED Course  I have reviewed the triage vital signs and the nursing notes.  Pertinent labs & imaging results that were available during my care of the patient were reviewed by me and considered in my medical decision making (see chart for details).  Pt does have a CBD stone, but no fever and minimal pain.   Pt did not want anything for pain while here. Pt d/w Dr. Dulce Sellar [facility]) who will arrange to do an ERCP tomorrow.  The pt d/w Dr. Nelson Chimes (triad) for admission.  Final Clinical Impressions(s) / ED Diagnoses   Final diagnoses:  Common bile duct stone  Elevated bilirubin    ED Discharge Orders    None  Jacalyn Lefevre, MD 07/25/18 1253

## 2018-07-25 NOTE — H&P (View-Only) (Signed)
[facility] Consultation Note  Referring Provider: Dr. Amin ([facility]) Primary Care Physician:  Duncan, [name], MD Primary Gastroenterologist:  Dr. Erisha Paugh  Reason for Consultation:  Elevated liver enzymes, [name]  Had cholecystectomy about one month ago for chronic cholecystitis; LFTs normal pre-operatively and no intraoperative cholangiogram was done.  Couple weeks ago, began having abdominal pain; lipase elevated and LFTs elevated in mixed pattern.  No abdominal pain for a couple weeks, but had MRCP yesterday which showed stone in mid-CBD with upstream intra- and extrahepatic biliary ductal dilatation and cystic duct stump dilatation.     Past Medical History:  Diagnosis Date  . Erectile dysfunction   . Hypercholesteremia   . Hypertension   . Insomnia   . Prediabetes   . Vitamin D deficiency     Past Surgical History:  Procedure Laterality Date  . CHOLECYSTECTOMY N/A 06/19/2018   Procedure: LAPAROSCOPIC CHOLECYSTECTOMY;  Surgeon: Blackman, Douglas, MD;  Location: [facility] OR;  Service: General;  Laterality: N/A;  . COLONOSCOPY  04/2017  . COLONOSCOPY WITH ESOPHAGOGASTRODUODENOSCOPY (EGD)    . IRRIGATION AND DEBRIDEMENT ABSCESS N/A 09/25/2014   Procedure: IRRIGATION AND DEBRIDEMENT ABSCESS;  Surgeon: Lester Borden, MD;  Location: [facility] OR;  Service: Urology;  Laterality: N/A;  . SCROTAL EXPLORATION N/A 09/25/2014   Procedure: SCROTUM EXPLORATION;  Surgeon: Lester Borden, MD;  Location: [facility] OR;  Service: Urology;  Laterality: N/A;  . TONSILLECTOMY      Prior to Admission medications   Medication Sig Start Date End Date Taking? Authorizing Provider  acetaminophen (TYLENOL) 500 MG tablet Take 1,000 mg by mouth every 6 (six) hours as needed for mild pain.   Yes [provider]  amLODipine (NORVASC) 10 MG tablet Take 1 tablet (10 mg total) by mouth daily. 11/27/17  Yes Duncan, [name], MD   colchicine 0.6 MG tablet Take 1 tablet (0.6 mg total) by mouth daily as needed. Okay to fill with mitigare or colcrys if cheaper. Patient taking differently: Take 0.6 mg by mouth daily as needed (gout flare ups).  09/19/17  Yes Duncan, [name], MD  diclofenac sodium (VOLTAREN) 1 % GEL Apply 2 g topically daily. 09/19/17  Yes Duncan, [name], MD  lisinopril-hydrochlorothiazide (PRINZIDE,ZESTORETIC) 20-12.5 MG tablet Take 1 tablet by mouth daily. 09/19/17  Yes Duncan, [name], MD  omeprazole (PRILOSEC) 20 MG capsule Take 1 capsule (20 mg total) by mouth 2 (two) times daily before a meal. Patient taking differently: Take 20 mg by mouth daily.  03/14/18  Yes Duncan, [name], MD  simvastatin (ZOCOR) 10 MG tablet Take 1 tablet (10 mg total) by mouth daily at 6 PM. 09/19/17  Yes Duncan, [name], MD  triamcinolone cream (KENALOG) 0.5 % Apply 1 application topically 2 (two) times daily as needed. Patient taking differently: Apply 1 application topically 2 (two) times daily as needed (irritation).  09/19/17  Yes Duncan, [name], MD  ondansetron (ZOFRAN) 4 MG tablet Take 1 tablet (4 mg total) by mouth every 8 (eight) hours as needed for nausea or vomiting. Patient not taking: Reported on 07/25/2018 07/19/18   Duncan, [name], MD    Current Facility-Administered Medications  Medication Dose Route Frequency Provider Last Rate Last Dose  . 0.9 %  sodium chloride infusion   Intravenous Continuous Amin, Ankit Chirag, MD 75 mL/hr at 07/25/18 1350    . amLODipine (NORVASC) tablet 10 mg  10 mg Oral   Daily Amin, Ankit Chirag, MD   10 mg at 07/25/18 1515  . bisacodyl (DULCOLAX) EC tablet 5 mg  5 mg Oral Daily PRN Amin, Ankit Chirag, MD      . lisinopril (PRINIVIL,ZESTRIL) tablet 20 mg  20 mg Oral Daily Wofford, Drew A, RPH   20 mg at 07/25/18 1515   And  . hydrochlorothiazide (MICROZIDE) capsule 12.5 mg  12.5 mg Oral Daily Wofford, Drew A, RPH   12.5 mg at 07/25/18 1520  . insulin aspart (novoLOG) injection 0-9  Units  0-9 Units Subcutaneous TID WC Amin, Ankit Chirag, MD      . ketorolac (TORADOL) 30 MG/ML injection 30 mg  30 mg Intravenous Q6H PRN Amin, Ankit Chirag, MD      . magnesium citrate solution 1 Bottle  1 Bottle Oral Once PRN Amin, Ankit Chirag, MD      . morphine 2 MG/ML injection 1 mg  1 mg Intravenous Q4H PRN Amin, Ankit Chirag, MD      . ondansetron (ZOFRAN) tablet 4 mg  4 mg Oral Q6H PRN Amin, Ankit Chirag, MD       Or  . ondansetron (ZOFRAN) injection 4 mg  4 mg Intravenous Q6H PRN Amin, Ankit Chirag, MD      . oxyCODONE (Oxy IR/ROXICODONE) immediate release tablet 5 mg  5 mg Oral Q4H PRN Amin, Ankit Chirag, MD      . pantoprazole (PROTONIX) EC tablet 40 mg  40 mg Oral Daily Amin, Ankit Chirag, MD   40 mg at 07/25/18 1516  . senna-docusate (Senokot-S) tablet 1 tablet  1 tablet Oral QHS PRN Amin, Ankit Chirag, MD      . simvastatin (ZOCOR) tablet 10 mg  10 mg Oral q1800 Amin, Ankit Chirag, MD       Current Outpatient Medications  Medication Sig Dispense Refill  . acetaminophen (TYLENOL) 500 MG tablet Take 1,000 mg by mouth every 6 (six) hours as needed for mild pain.    . amLODipine (NORVASC) 10 MG tablet Take 1 tablet (10 mg total) by mouth daily. 90 tablet 3  . colchicine 0.6 MG tablet Take 1 tablet (0.6 mg total) by mouth daily as needed. Okay to fill with mitigare or colcrys if cheaper. (Patient taking differently: Take 0.6 mg by mouth daily as needed (gout flare ups). ) 30 tablet 1  . diclofenac sodium (VOLTAREN) 1 % GEL Apply 2 g topically daily. 300 g 3  . lisinopril-hydrochlorothiazide (PRINZIDE,ZESTORETIC) 20-12.5 MG tablet Take 1 tablet by mouth daily. 90 tablet 3  . omeprazole (PRILOSEC) 20 MG capsule Take 1 capsule (20 mg total) by mouth 2 (two) times daily before a meal. (Patient taking differently: Take 20 mg by mouth daily. )    . simvastatin (ZOCOR) 10 MG tablet Take 1 tablet (10 mg total) by mouth daily at 6 PM. 90 tablet 3  . triamcinolone cream (KENALOG) 0.5 % Apply 1  application topically 2 (two) times daily as needed. (Patient taking differently: Apply 1 application topically 2 (two) times daily as needed (irritation). ) 30 g 1  . ondansetron (ZOFRAN) 4 MG tablet Take 1 tablet (4 mg total) by mouth every 8 (eight) hours as needed for nausea or vomiting. (Patient not taking: Reported on 07/25/2018) 20 tablet 0    Allergies as of 07/25/2018 - Review Complete 07/25/2018  Allergen Reaction Noted  . Penicillins Hives 09/25/2014  . Sildenafil Nausea And Vomiting 09/20/2017    Family History  Problem Relation Age of Onset  .   Hypertension Father   . Lung cancer Mother   . Diabetes Paternal Aunt   . Colon cancer Neg Hx   . Prostate cancer Neg Hx     Social History   Socioeconomic History  . Marital status: Married    Spouse name: Not on file  . Number of children: Not on file  . Years of education: Not on file  . Highest education level: Not on file  Occupational History  . Not on file  Social Needs  . Financial resource strain: Not on file  . Food insecurity:    Worry: Not on file    Inability: Not on file  . Transportation needs:    Medical: Not on file    Non-medical: Not on file  Tobacco Use  . Smoking status: Never Smoker  . Smokeless tobacco: Never Used  Substance and Sexual Activity  . Alcohol use: No    Alcohol/week: 0.0 standard drinks  . Drug use: No  . Sexual activity: Never  Lifestyle  . Physical activity:    Days per week: Not on file    Minutes per session: Not on file  . Stress: Not on file  Relationships  . Social connections:    Talks on phone: Not on file    Gets together: Not on file    Attends religious service: Not on file    Active member of club or organization: Not on file    Attends meetings of clubs or organizations: Not on file    Relationship status: Not on file  . Intimate partner violence:    Fear of current or ex partner: Not on file    Emotionally abused: Not on file    Physically abused: Not on  file    Forced sexual activity: Not on file  Other Topics Concern  . Not on file  Social History Narrative   Married 2016   4 children, 3 grandchildren.   Works at [employer].   Has a '68 [place], '87 T top [place] SS   enjoys spending time with his church.     Review of Systems: As per HPI, all others negative  Physical Exam: Vital signs in last 24 hours: Temp:  [98 F (36.7 C)] 98 F (36.7 C) (11/21 1005) Pulse Rate:  [73-77] 77 (11/21 1514) Resp:  [16-18] 16 (11/21 1514) BP: (143-175)/(96-110) 175/107 (11/21 1515) SpO2:  [98 %-100 %] 98 % (11/21 1514) Weight:  [95.7 kg] 95.7 kg (11/21 1005)   General:   Alert,  Well-developed, well-nourished, pleasant and cooperative in NAD Head:  Normocephalic and atraumatic. Eyes:  Scleral icterus bilaterally   Conjunctiva pink. Ears:  Normal auditory acuity. Nose:  No deformity, discharge,  or lesions. Mouth:  No deformity or lesions.  Oropharynx pink & moist. Neck:  Supple; no masses or thyromegaly. Lungs:  Clear throughout to auscultation.   No wheezes, crackles, or rhonchi. No acute distress. Heart:  Regular rate and rhythm; no murmurs, clicks, rubs,  or gallops. Abdomen:  Soft, nontender and nondistended. No masses, hepatosplenomegaly or hernias noted. Normal bowel sounds, without guarding, and without rebound.     Msk:  Symmetrical without gross deformities. Normal posture. Pulses:  Normal pulses noted. Extremities:  Without clubbing or edema. Neurologic:  Alert and  oriented x4;  grossly normal neurologically. Skin:  Intact without significant lesions or rashes. Psych:  Alert and cooperative. Normal mood and affect.   Lab Results: Recent Labs    07/25/18 1148    WBC 5.6  HGB 14.7  HCT 44.7  PLT 298   BMET Recent Labs    07/25/18 1148  NA 140  K 3.3*  CL 99  CO2 29  GLUCOSE 94  BUN 14  CREATININE 1.08  CALCIUM 9.5   LFT Recent Labs    07/25/18 1148  PROT 8.1  ALBUMIN 4.1  AST 194*  ALT  353*  ALKPHOS 328*  BILITOT 12.2*   PT/INR No results for input(s): LABPROT, INR in the last 72 hours.  Studies/Results: Mr 3d Recon At Scanner  Addendum Date: 07/25/2018   ADDENDUM REPORT: 07/25/2018 11:12 ADDENDUM: The original report was by Dr. Walter Liebkemann. The following addendum is by Dr. Walter Liebkemann: The impressions from the report were called by technologist Bonnie to Dr.Duncan this morning. Dr. Duncan passed on instructions for the patient to proceed to the emergency room. I had gotten called away to do a procedure and accordingly was not able to reach Dr. Duncan myself, but I am available to discuss the case further if needed. Electronically Signed   By: Walter  Liebkemann M.D.   On: 07/25/2018 11:12   Result Date: 07/25/2018 CLINICAL DATA:  Cholecystectomy on 06/19/2018.  Acute pancreatitis. EXAM: MRI ABDOMEN WITHOUT AND WITH CONTRAST (INCLUDING MRCP) TECHNIQUE: Multiplanar multisequence MR imaging of the abdomen was performed both before and after the administration of intravenous contrast. Heavily T2-weighted images of the biliary and pancreatic ducts were obtained, and three-dimensional MRCP images were rendered by post processing. CONTRAST:  9 cc Gadavist COMPARISON:  Ultrasound 05/03/2018. FINDINGS: Lower chest: Mild cardiomegaly. Hepatobiliary: Cholecystectomy with 1.8 by 1.3 cm small fluid signal intensity along the gallbladder fossa likely reflecting a small postoperative fluid collection. There is moderate intrahepatic biliary dilatation with a distended cystic duct remnant up to 1.4 cm in diameter, and a common hepatic duct at 1.1 cm in diameter. The common bile duct is likewise dilated up to about 0.8 cm in diameter, extending down to a 10 by 8 mm in diameter filling defect in the CBD on image 68/400 compatible with stone. This is about 1.9 cm proximal to the ampulla and is believed to be causing biliary obstruction. There is mildly accentuated enhancement along the  margins of the bile ducts raising the possibility of cholangitis. Pancreas: Slight irregularity of the dorsal pancreatic duct especially in the pancreatic head may be a manifestation of the patient's recent pancreatitis. No findings of pancreatic necrosis, pseudocyst, or abscess. We do not demonstrate a prominent degree of peripancreatic inflammatory stranding currently. Spleen:  Unremarkable Adrenals/Urinary Tract: Severely atrophic right kidney is likely not contributing much in the way of relative functional renal mass. Both adrenal glands are unremarkable. Small benign cyst of the left kidney lower pole. Stomach/Bowel: Unremarkable Vascular/Lymphatic: A retrocaval node measures 0.9 cm in short axis on image 73/1104. Additional smaller retroperitoneal lymph nodes are also present. Other:  No supplemental non-categorized findings. Musculoskeletal: Unremarkable IMPRESSION: 1. There is a stone in the common bile duct about 1.9 cm proximal to the ampulla, measuring 10 by 8 mm in diameter. This is causing obstruction, with moderate intrahepatic and extrahepatic biliary dilatation. There is also enhancement of the walls of the biliary tree raising the possibility of mild cholangitis. 2. Slight irregularity of the dorsal pancreatic duct likely a manifestation of the patient's recent pancreatitis. 3. Severely atrophic right kidney. 4. Upper normal size retrocaval lymph node on the right is likely reactive. 5. Mild cardiomegaly. Radiology assistant personnel have been notified to put me in telephone   contact with the referring physician or the referring physician's clinical representative in order to discuss these findings. Once this communication is established I will issue an addendum to this report for documentation purposes. Electronically Signed: By: Walter  Liebkemann M.D. On: 07/25/2018 09:24   Mr Abdomen Mrcp W Wo Contast  Addendum Date: 07/25/2018   ADDENDUM REPORT: 07/25/2018 11:12 ADDENDUM: The original  report was by Dr. Walter Liebkemann. The following addendum is by Dr. Walter Liebkemann: The impressions from the report were called by technologist Bonnie to Dr.Duncan this morning. Dr. Duncan passed on instructions for the patient to proceed to the emergency room. I had gotten called away to do a procedure and accordingly was not able to reach Dr. Duncan myself, but I am available to discuss the case further if needed. Electronically Signed   By: Walter  Liebkemann M.D.   On: 07/25/2018 11:12   Result Date: 07/25/2018 CLINICAL DATA:  Cholecystectomy on 06/19/2018.  Acute pancreatitis. EXAM: MRI ABDOMEN WITHOUT AND WITH CONTRAST (INCLUDING MRCP) TECHNIQUE: Multiplanar multisequence MR imaging of the abdomen was performed both before and after the administration of intravenous contrast. Heavily T2-weighted images of the biliary and pancreatic ducts were obtained, and three-dimensional MRCP images were rendered by post processing. CONTRAST:  9 cc Gadavist COMPARISON:  Ultrasound 05/03/2018. FINDINGS: Lower chest: Mild cardiomegaly. Hepatobiliary: Cholecystectomy with 1.8 by 1.3 cm small fluid signal intensity along the gallbladder fossa likely reflecting a small postoperative fluid collection. There is moderate intrahepatic biliary dilatation with a distended cystic duct remnant up to 1.4 cm in diameter, and a common hepatic duct at 1.1 cm in diameter. The common bile duct is likewise dilated up to about 0.8 cm in diameter, extending down to a 10 by 8 mm in diameter filling defect in the CBD on image 68/400 compatible with stone. This is about 1.9 cm proximal to the ampulla and is believed to be causing biliary obstruction. There is mildly accentuated enhancement along the margins of the bile ducts raising the possibility of cholangitis. Pancreas: Slight irregularity of the dorsal pancreatic duct especially in the pancreatic head may be a manifestation of the patient's recent pancreatitis. No findings of  pancreatic necrosis, pseudocyst, or abscess. We do not demonstrate a prominent degree of peripancreatic inflammatory stranding currently. Spleen:  Unremarkable Adrenals/Urinary Tract: Severely atrophic right kidney is likely not contributing much in the way of relative functional renal mass. Both adrenal glands are unremarkable. Small benign cyst of the left kidney lower pole. Stomach/Bowel: Unremarkable Vascular/Lymphatic: A retrocaval node measures 0.9 cm in short axis on image 73/1104. Additional smaller retroperitoneal lymph nodes are also present. Other:  No supplemental non-categorized findings. Musculoskeletal: Unremarkable IMPRESSION: 1. There is a stone in the common bile duct about 1.9 cm proximal to the ampulla, measuring 10 by 8 mm in diameter. This is causing obstruction, with moderate intrahepatic and extrahepatic biliary dilatation. There is also enhancement of the walls of the biliary tree raising the possibility of mild cholangitis. 2. Slight irregularity of the dorsal pancreatic duct likely a manifestation of the patient's recent pancreatitis. 3. Severely atrophic right kidney. 4. Upper normal size retrocaval lymph node on the right is likely reactive. 5. Mild cardiomegaly. Radiology assistant personnel have been notified to put me in telephone contact with the referring physician or the referring physician's clinical representative in order to discuss these findings. Once this communication is established I will issue an addendum to this report for documentation purposes. Electronically Signed: By: Walter  Liebkemann M.D. On: 07/25/2018 09:24     Impression:  1.  Abdominal pain, resolved. Prior elevated lipase, but no active pancreatitis seen on today's MRCP. 2.  Cholelithiasis, cholecystectomy about one month ago. 3.  New onset elevated liver enzymes (normal pre-operatively). 4.  Abnormal MRCP:  Stone mid CBD with cystic duct stump and upstream intra- and extrahepatic biliary ductal  dilatation.  Plan:  1.  Clear liquid diet, NPO after midnight. 2.  Endoscopic retrograde cholangiography with hopeful biliary sphincterotomy and bile duct stone extraction. 3.  Risks (up to and including bleeding, infection, perforation, pancreatitis that can be complicated by infected necrosis and death), benefits (removal of stones, alleviating blockage, decreasing risk of cholangitis or choledocholithiasis-related pancreatitis), and alternatives (watchful waiting, percutaneous transhepatic cholangiography) of ERCP were explained to patient/family in detail and patient elects to proceed.   LOS: 0 days   Julious Langlois M  07/25/2018, 3:46 PM  Cell [phone] If no answer or after 5 PM call [phone]  

## 2018-07-26 ENCOUNTER — Inpatient Hospital Stay (HOSPITAL_COMMUNITY): Payer: Managed Care, Other (non HMO)

## 2018-07-26 ENCOUNTER — Encounter (HOSPITAL_COMMUNITY): Payer: Self-pay | Admitting: *Deleted

## 2018-07-26 ENCOUNTER — Encounter (HOSPITAL_COMMUNITY)
Admission: EM | Disposition: A | Discharge status: Home / Self Care | Payer: Self-pay | Source: Home / Self Care | Attending: Internal Medicine

## 2018-07-26 ENCOUNTER — Telehealth: Payer: Self-pay | Admitting: Family Medicine

## 2018-07-26 ENCOUNTER — Inpatient Hospital Stay (HOSPITAL_COMMUNITY): Payer: Managed Care, Other (non HMO) | Admitting: Anesthesiology

## 2018-07-26 DIAGNOSIS — R1084 Generalized abdominal pain: Secondary | ICD-10-CM

## 2018-07-26 HISTORY — PX: ERCP: SHX5425

## 2018-07-26 HISTORY — PX: SPHINCTEROTOMY: SHX5544

## 2018-07-26 HISTORY — PX: BILIARY STENT PLACEMENT: SHX5538

## 2018-07-26 LAB — GLUCOSE, CAPILLARY
GLUCOSE-CAPILLARY: 120 mg/dL — AB (ref 70–99)
Glucose-Capillary: 81 mg/dL (ref 70–99)
Glucose-Capillary: 145 mg/dL — ABNORMAL HIGH (ref 70–99)
Glucose-Capillary: 135 mg/dL — ABNORMAL HIGH (ref 70–99)

## 2018-07-26 LAB — COMPREHENSIVE METABOLIC PANEL
ALBUMIN: 3.4 g/dL — AB (ref 3.5–5.0)
ALT: 318 U/L — ABNORMAL HIGH (ref 0–44)
ALT: 351 U/L — ABNORMAL HIGH (ref 0–44)
ANION GAP: 9 (ref 5–15)
ANION GAP: 7 (ref 5–15)
AST: 165 U/L — ABNORMAL HIGH (ref 15–41)
AST: 167 U/L — AB (ref 15–41)
Albumin: 3.3 g/dL — ABNORMAL LOW (ref 3.5–5.0)
Alkaline Phosphatase: 300 U/L — ABNORMAL HIGH (ref 38–126)
Alkaline Phosphatase: 341 U/L — ABNORMAL HIGH (ref 38–126)
BILIRUBIN TOTAL: 10.2 mg/dL — AB (ref 0.3–1.2)
BUN: 12 mg/dL (ref 6–20)
BUN: 12 mg/dL (ref 6–20)
CHLORIDE: 104 mmol/L (ref 98–111)
CHLORIDE: 103 mmol/L (ref 98–111)
CO2: 27 mmol/L (ref 22–32)
CO2: 27 mmol/L (ref 22–32)
Calcium: 8.6 mg/dL — ABNORMAL LOW (ref 8.9–10.3)
Calcium: 8.9 mg/dL (ref 8.9–10.3)
Creatinine, Ser: 1.07 mg/dL (ref 0.61–1.24)
Creatinine, Ser: 1.23 mg/dL (ref 0.61–1.24)
GFR calc Af Amer: >60 mL/min (ref >60)
GFR calc Af Amer: >60 mL/min (ref >60)
Glucose, Bld: 110 mg/dL — ABNORMAL HIGH (ref 70–99)
Glucose, Bld: 168 mg/dL — ABNORMAL HIGH (ref 70–99)
POTASSIUM: 3.4 mmol/L — AB (ref 3.5–5.1)
POTASSIUM: 4 mmol/L (ref 3.5–5.1)
Sodium: 140 mmol/L (ref 135–145)
Sodium: 137 mmol/L (ref 135–145)
TOTAL PROTEIN: 6.5 g/dL (ref 6.5–8.1)
Total Bilirubin: 8.7 mg/dL — ABNORMAL HIGH (ref 0.3–1.2)
Total Protein: 7.6 g/dL (ref 6.5–8.1)

## 2018-07-26 LAB — CBC
HCT: 40.3 % (ref 39.0–52.0)
HEMOGLOBIN: 13.2 g/dL (ref 13.0–17.0)
MCH: 27.6 pg (ref 26.0–34.0)
MCHC: 32.8 g/dL (ref 30.0–36.0)
MCV: 84.1 fL (ref 80.0–100.0)
NRBC: 0 % (ref 0.0–0.2)
Platelets: 275 10*3/uL (ref 150–400)
RBC: 4.79 MIL/uL (ref 4.22–5.81)
RDW: 14.5 % (ref 11.5–15.5)
WBC: 5 10*3/uL (ref 4.0–10.5)

## 2018-07-26 LAB — HIV ANTIBODY (ROUTINE TESTING W REFLEX): HIV SCREEN 4TH GENERATION: NONREACTIVE

## 2018-07-26 LAB — APTT: APTT: 28 s (ref 24–36)

## 2018-07-26 LAB — PROTIME-INR
INR: 0.9
PROTHROMBIN TIME: 12 s (ref 11.4–15.2)

## 2018-07-26 SURGERY — ERCP, WITH INTERVENTION IF INDICATED
Anesthesia: General

## 2018-07-26 MED ORDER — LACTATED RINGERS IV SOLN
INTRAVENOUS | Status: DC
  Administered 2018-07-26: 10:00:00 via INTRAVENOUS
  Administered 2018-07-26: 1000 mL via INTRAVENOUS

## 2018-07-26 MED ORDER — FENTANYL CITRATE (PF) 100 MCG/2ML IJ SOLN
INTRAMUSCULAR | Status: AC
  Filled 2018-07-26: qty 2

## 2018-07-26 MED ORDER — PROPOFOL 10 MG/ML IV BOLUS
INTRAVENOUS | Status: AC
  Filled 2018-07-26: qty 20

## 2018-07-26 MED ORDER — PROPOFOL 10 MG/ML IV BOLUS
INTRAVENOUS | Status: DC | PRN
  Administered 2018-07-26: 150 mg via INTRAVENOUS

## 2018-07-26 MED ORDER — SODIUM CHLORIDE 0.9 % IV SOLN
INTRAVENOUS | Status: DC | PRN
  Administered 2018-07-26: 40 mL

## 2018-07-26 MED ORDER — ONDANSETRON HCL 4 MG/2ML IJ SOLN: 4.0000 mg | Freq: Once | INTRAMUSCULAR | Status: DC | PRN

## 2018-07-26 MED ORDER — CIPROFLOXACIN IN D5W 400 MG/200ML IV SOLN
400.0000 mg | Freq: Once | INTRAVENOUS | Status: AC
  Administered 2018-07-26: 400 mg via INTRAVENOUS

## 2018-07-26 MED ORDER — FENTANYL CITRATE (PF) 100 MCG/2ML IJ SOLN
INTRAMUSCULAR | Status: DC | PRN
  Administered 2018-07-26 (×2): 50 ug via INTRAVENOUS

## 2018-07-26 MED ORDER — SODIUM CHLORIDE 0.9 % IV SOLN: INTRAVENOUS | Status: DC

## 2018-07-26 MED ORDER — POTASSIUM CHLORIDE 10 MEQ/100ML IV SOLN
10.0000 meq | INTRAVENOUS | Status: AC
  Administered 2018-07-26: 10 meq via INTRAVENOUS
  Filled 2018-07-26: qty 100

## 2018-07-26 MED ORDER — OXYCODONE HCL 5 MG PO TABS: 5.0000 mg | ORAL_TABLET | Freq: Once | ORAL | Status: DC | PRN

## 2018-07-26 MED ORDER — PHENYLEPHRINE HCL 10 MG/ML IJ SOLN
INTRAMUSCULAR | Status: DC | PRN
  Administered 2018-07-26 (×4): 80 ug via INTRAVENOUS

## 2018-07-26 MED ORDER — OXYCODONE HCL 5 MG/5ML PO SOLN: 5.0000 mg | Freq: Once | ORAL | Status: DC | PRN

## 2018-07-26 MED ORDER — ROCURONIUM BROMIDE 100 MG/10ML IV SOLN
INTRAVENOUS | Status: DC | PRN
  Administered 2018-07-26: 50 mg via INTRAVENOUS

## 2018-07-26 MED ORDER — ONDANSETRON HCL 4 MG/2ML IJ SOLN
INTRAMUSCULAR | Status: DC | PRN
  Administered 2018-07-26: 4 mg via INTRAVENOUS

## 2018-07-26 MED ORDER — INDOMETHACIN 50 MG RE SUPP: 100.0000 mg | Freq: Once | RECTAL | Status: DC

## 2018-07-26 MED ORDER — DEXAMETHASONE SODIUM PHOSPHATE 10 MG/ML IJ SOLN
INTRAMUSCULAR | Status: DC | PRN
  Administered 2018-07-26: 10 mg via INTRAVENOUS

## 2018-07-26 MED ORDER — LACTATED RINGERS IV SOLN: INTRAVENOUS | Status: DC

## 2018-07-26 MED ORDER — MIDAZOLAM HCL 2 MG/2ML IJ SOLN
INTRAMUSCULAR | Status: AC
  Filled 2018-07-26: qty 2

## 2018-07-26 MED ORDER — FENTANYL CITRATE (PF) 100 MCG/2ML IJ SOLN: 25.0000 ug | INTRAMUSCULAR | Status: DC | PRN

## 2018-07-26 MED ORDER — POTASSIUM CHLORIDE CRYS ER 20 MEQ PO TBCR
40.0000 meq | EXTENDED_RELEASE_TABLET | Freq: Once | ORAL | Status: AC
  Administered 2018-07-26: 40 meq via ORAL
  Filled 2018-07-26: qty 2

## 2018-07-26 MED ORDER — SUGAMMADEX SODIUM 200 MG/2ML IV SOLN
INTRAVENOUS | Status: DC | PRN
  Administered 2018-07-26: 200 mg via INTRAVENOUS

## 2018-07-26 MED ORDER — GLUCAGON HCL RDNA (DIAGNOSTIC) 1 MG IJ SOLR
INTRAMUSCULAR | Status: DC | PRN
  Administered 2018-07-26: .5 mg via INTRAVENOUS

## 2018-07-26 MED ORDER — INDOMETHACIN 50 MG RE SUPP
RECTAL | Status: DC | PRN
  Administered 2018-07-26: 100 mg via RECTAL

## 2018-07-26 MED ORDER — LIDOCAINE HCL (CARDIAC) PF 100 MG/5ML IV SOSY
PREFILLED_SYRINGE | INTRAVENOUS | Status: DC | PRN
  Administered 2018-07-26: 100 mg via INTRAVENOUS

## 2018-07-26 MED ORDER — EPHEDRINE SULFATE 50 MG/ML IJ SOLN
INTRAMUSCULAR | Status: DC | PRN
  Administered 2018-07-26: 5 mg via INTRAVENOUS

## 2018-07-26 MED ORDER — MIDAZOLAM HCL 5 MG/5ML IJ SOLN
INTRAMUSCULAR | Status: DC | PRN
  Administered 2018-07-26: 2 mg via INTRAVENOUS

## 2018-07-26 NOTE — Interval H&P Note (Signed)
History and Physical Interval Note:  07/26/2018 8:24 AM  Raymond Willis  has presented today for surgery, with the diagnosis of bile duct stone, elevated LFTs  The various methods of treatment have been discussed with the patient and family. After consideration of risks, benefits and other options for treatment, the patient has consented to  Procedure(s): ENDOSCOPIC RETROGRADE CHOLANGIOPANCREATOGRAPHY (ERCP) (N/A) as a surgical intervention .  The patient's history has been reviewed, patient examined, no change in status, stable for surgery.  I have reviewed the patient's chart and labs.  Questions were answered to the patient's satisfaction.     Freddy JakschUTLAW,Tavari Loadholt M

## 2018-07-26 NOTE — Anesthesia Postprocedure Evaluation (Signed)
Anesthesia Post Note  Patient: Raymond Willis  Procedure(s) Performed: ENDOSCOPIC RETROGRADE CHOLANGIOPANCREATOGRAPHY (ERCP) (N/A ) SPHINCTEROTOMY BILIARY STENT PLACEMENT (N/A )     Patient location during evaluation: PACU Anesthesia Type: General Level of consciousness: awake and alert Pain management: pain level controlled Vital Signs Assessment: post-procedure vital signs reviewed and stable Respiratory status: spontaneous breathing, nonlabored ventilation, respiratory function stable and patient connected to nasal cannula oxygen Cardiovascular status: blood pressure returned to baseline and stable Postop Assessment: no apparent nausea or vomiting Anesthetic complications: no    Last Vitals:  Vitals:   07/26/18 1040 07/26/18 1350  BP: 136/87 (!) 155/95  Pulse: 72 76  Resp: 14 20  Temp:  36.9 C  SpO2: 93% 95%    Last Pain:  Vitals:   07/26/18 1350  TempSrc: Oral  PainSc:                  Dejohn Ibarra COKER

## 2018-07-26 NOTE — Anesthesia Preprocedure Evaluation (Addendum)
Anesthesia Evaluation  Patient identified by MRN, date of birth, ID band Patient awake    Reviewed: Allergy & Precautions, NPO status , Patient's Chart, lab work & pertinent test results  Airway Mallampati: III  TM Distance: >3 FB Neck ROM: Full    Dental  (+) Teeth Intact, Dental Advisory Given   Pulmonary    breath sounds clear to auscultation       Cardiovascular hypertension,  Rhythm:Regular Rate:Normal     Neuro/Psych    GI/Hepatic   Endo/Other    Renal/GU      Musculoskeletal   Abdominal   Peds  Hematology   Anesthesia Other Findings   Reproductive/Obstetrics                            Anesthesia Physical Anesthesia Plan  ASA: III  Anesthesia Plan: General   Post-op Pain Management:    Induction: Intravenous  PONV Risk Score and Plan: Ondansetron and Dexamethasone  Airway Management Planned: Oral ETT  Additional Equipment:   Intra-op Plan:   Post-operative Plan: Extubation in OR  Informed Consent: I have reviewed the patients History and Physical, chart, labs and discussed the procedure including the risks, benefits and alternatives for the proposed anesthesia with the patient or authorized representative who has indicated his/her understanding and acceptance.     Dental advisory given  Plan Discussed with: CRNA and Anesthesiologist  Anesthesia Plan Comments:         Anesthesia Quick Evaluation  

## 2018-07-26 NOTE — Telephone Encounter (Signed)
I get the point about his question.  Given his situation/illness, he may have variable sugar levels that are higher than normal.  It may be reasonable to correct that in the short term with insulin while he is inpatient. This is commonly done.  He needs to direct these questions to the inpatient team as they are on the site and managing his care moment to moment.

## 2018-07-26 NOTE — Telephone Encounter (Signed)
Apparently the entire message was not relayed in the first message.  Patient states he is concerned about another surgery.  They did not get the stone but did place a stent to relieve the pressure.  Patient says they are talking about sending him home now and he doesn't know when he is supposed to be having more surgery or any future plans.  I explained that this information would have to come through the inpatient team / surgeon but that I would relay this message to Dr. Para Marchuncan. Please advise.

## 2018-07-26 NOTE — Progress Notes (Signed)
PROGRESS NOTE    Raymond Willis  ZOX:[id number] DOB: 1961-07-04 DOA: 07/25/2018 PCP: Joaquim Nam, MD   Brief Narrative: Patient is a 57 year old male with past medical history of gout, hypertension, hyperlipidemia, recent cholecystectomy who presents to the hospital with complaints of abdominal pain.  He had underwent laparoscopic cholecystectomy on 06/19/2018.  After the procedure he has been noticing increasing abdominal bloating, nausea also had few episodes of vomiting.  Upon visiting his PCP, he was found to have elevated LFTs and total bilirubin so he was sent to the hospital for further evaluation.  MRCP was done which showed stone in the common bile duct causing obstruction with moderate intra-and extrahepatic bile dilation with concern for cholangitis.  GI consulted.  Underwent ERCP today.  Assessment & Plan:   Principal Problem:   Abdominal pain Active Problems:   Essential hypertension   Hyperlipidemia   Gout   GERD (gastroesophageal reflux disease)   Epigastric pain   LFT elevation  CBD/cystic  stone causing transaminitis/elevated total bilirubin: Underwent ERCP by GI today.  Underwent biliary sphincterectomy.  Plastic stent was placed into the cystic duct. He will be considered ERCP with cholangioscopy of the cystic duct remnant for consideration of electrohydraulic lithotripsy(suspicion is that the impacted stone was pushed into the cystic duct during the procedure)  He is status post laparascopic cholecystectomy. Will check CMP tomorrow  Abdominal pain: From CBD obstruction.  Abdomen pain already better.  Continue IV fluids and antiemetics, analgesics.  Start on clear liquid diet.  Hypertension: Currently blood pressure stable.  Continue his home medications.  GERD: Continue Protonix  Hyperlipidemia: Continue Zocor  Gout: Continue colchicine  Hypokalemia: Supplemented with potassium    DVT prophylaxisSCD: Code Status: Full Family Communication: None  present at the bedside Disposition Plan: Home likely tomorrow   Consultants: GI  Procedures: ERCP  Antimicrobials: None  Subjective:  Patient seen and examined the bedside this afternoon.  Remains comfortable.  Abdomen pain is much improved. Denies any nausea or vomiting. Objective: Vitals:   07/26/18 1020 07/26/18 1030 07/26/18 1040 07/26/18 1350  BP: 139/90 132/87 136/87 (!) 155/95  Pulse: 78 77 72 76  Resp: (!) 0 15 14 20   Temp:    98.4 F (36.9 C)  TempSrc:    Oral  SpO2: 95% 92% 93% 95%  Weight:      Height:        Intake/Output Summary (Last 24 hours) at 07/26/2018 1417 Last data filed at 07/26/2018 1014 Gross per 24 hour  Intake 2262.5 ml  Output 0 ml  Net 2262.5 ml   Filed Weights   07/25/18 1005 07/26/18 0809  Weight: 95.7 kg 95.7 kg    Examination:  General exam: Appears calm and comfortable ,Not in distress,average built HEENT:PERRL,Oral mucosa moist, Ear/Nose normal on gross exam Respiratory system: Bilateral equal air entry, normal vesicular breath sounds, no wheezes or crackles  Cardiovascular system: S1 & S2 heard, RRR. No JVD, murmurs, rubs, gallops or clicks. No pedal edema. Gastrointestinal system: Abdomen is nondistended, soft and nontender. No organomegaly or masses felt. Normal bowel sounds heard. Central nervous system: Alert and oriented. No focal neurological deficits. Extremities: No edema, no clubbing ,no cyanosis, distal peripheral pulses palpable. Skin: No rashes, lesions or ulcers,no icterus ,no pallor MSK: Normal muscle bulk,tone ,power Psychiatry: Judgement and insight appear normal. Mood & affect appropriate.     Data Reviewed: I have personally reviewed following labs and imaging studies  CBC: Recent Labs  Lab 07/19/18 1556 07/22/18 1526  07/25/18 1148 07/26/18 0538  WBC 10.1 6.8 5.6 5.0  NEUTROABS 7,575 3.4 2.8  --   HGB 15.4 14.7 14.7 13.2  HCT 45.0 43.7 44.7 40.3  MCV 81.2 83.8 86.3 84.1  PLT 391 259.0 298 275    Basic Metabolic Panel: Recent Labs  Lab 07/19/18 1556 07/22/18 1526 07/25/18 1148 07/26/18 0538  NA 136 140 140 140  K 3.9 3.9 3.3* 3.4*  CL 98 101 99 104  CO2 24 26 29 27   GLUCOSE 122* 102* 94 110*  BUN 13 14 14 12   CREATININE 1.18 1.50 1.08 1.07  CALCIUM 10.2 9.7 9.5 8.6*   GFR: Estimated Creatinine Clearance: 83.9 mL/min (by C-G formula based on SCr of 1.07 mg/dL). Liver Function Tests: Recent Labs  Lab 07/19/18 1556 07/22/18 1526 07/25/18 1148 07/26/18 0538  AST 313* 79* 194* 165*  ALT 576* 244* 353* 318*  ALKPHOS  --  262* 328* 300*  BILITOT 5.7* 8.6* 12.2* 10.2*  PROT 7.7 7.7 8.1 6.5  ALBUMIN  --  4.3 4.1 3.3*   Recent Labs  Lab 07/19/18 1556 07/22/18 1526 07/25/18 1148  LIPASE 269* 49.0 43   No results for input(s): AMMONIA in the last 168 hours. Coagulation Profile: Recent Labs  Lab 07/26/18 0538  INR 0.90   Cardiac Enzymes: No results for input(s): CKTOTAL, CKMB, CKMBINDEX, TROPONINI in the last 168 hours. BNP (last 3 results) No results for input(s): PROBNP in the last 8760 hours. HbA1C: No results for input(s): HGBA1C in the last 72 hours. CBG: Recent Labs  Lab 07/25/18 1727 07/25/18 2134 07/26/18 0756 07/26/18 1213  GLUCAP 137* 93 81 145*   Lipid Profile: No results for input(s): CHOL, HDL, LDLCALC, TRIG, CHOLHDL, LDLDIRECT in the last 72 hours. Thyroid Function Tests: No results for input(s): TSH, T4TOTAL, FREET4, T3FREE, THYROIDAB in the last 72 hours. Anemia Panel: No results for input(s): VITAMINB12, FOLATE, FERRITIN, TIBC, IRON, RETICCTPCT in the last 72 hours. Sepsis Labs: No results for input(s): PROCALCITON, LATICACIDVEN in the last 168 hours.  Recent Results (from the past 240 hour(s))  Surgical pcr screen     Status: None   Collection Time: 07/25/18  9:35 PM  Result Value Ref Range Status   MRSA, PCR NEGATIVE NEGATIVE Final   Staphylococcus aureus NEGATIVE NEGATIVE Final    Comment: (NOTE) The Xpert SA Assay (FDA  approved for NASAL specimens in patients 322 years of age and older), is one component of a comprehensive surveillance program. It is not intended to diagnose infection nor to guide or monitor treatment. Performed at [facility], [address] [place], [place] [address]          Radiology Studies: Mr 3d Recon At Scanner  Addendum Date: 07/25/2018   ADDENDUM REPORT: 07/25/2018 11:12 ADDENDUM: The original report was by Dr. Gaylyn RongWalter Liebkemann. The following addendum is by Dr. Gaylyn RongWalter Liebkemann: The impressions from the report were called by technologist Kendal HymenBonnie to Dr.Duncan this morning. Dr. Para Marchuncan passed on instructions for the patient to proceed to the emergency room. I had gotten called away to do a procedure and accordingly was not able to reach Dr. Para Marchuncan myself, but I am available to discuss the case further if needed. Electronically Signed   By: Gaylyn RongWalter  Liebkemann M.D.   On: 07/25/2018 11:12   Result Date: 07/25/2018 CLINICAL DATA:  Cholecystectomy on 06/19/2018.  Acute pancreatitis. EXAM: MRI ABDOMEN WITHOUT AND WITH CONTRAST (INCLUDING MRCP) TECHNIQUE: Multiplanar multisequence MR imaging of the abdomen was performed both before  and after the administration of intravenous contrast. Heavily T2-weighted images of the biliary and pancreatic ducts were obtained, and three-dimensional MRCP images were rendered by post processing. CONTRAST:  9 cc Gadavist COMPARISON:  Ultrasound 05/03/2018. FINDINGS: Lower chest: Mild cardiomegaly. Hepatobiliary: Cholecystectomy with 1.8 by 1.3 cm small fluid signal intensity along the gallbladder fossa likely reflecting a small postoperative fluid collection. There is moderate intrahepatic biliary dilatation with a distended cystic duct remnant up to 1.4 cm in diameter, and a common hepatic duct at 1.1 cm in diameter. The common bile duct is likewise dilated up to about 0.8 cm in diameter, extending down to a 10 by 8 mm in diameter filling  defect in the CBD on image 68/400 compatible with stone. This is about 1.9 cm proximal to the ampulla and is believed to be causing biliary obstruction. There is mildly accentuated enhancement along the margins of the bile ducts raising the possibility of cholangitis. Pancreas: Slight irregularity of the dorsal pancreatic duct especially in the pancreatic head may be a manifestation of the patient's recent pancreatitis. No findings of pancreatic necrosis, pseudocyst, or abscess. We do not demonstrate a prominent degree of peripancreatic inflammatory stranding currently. Spleen:  Unremarkable Adrenals/Urinary Tract: Severely atrophic right kidney is likely not contributing much in the way of relative functional renal mass. Both adrenal glands are unremarkable. Small benign cyst of the left kidney lower pole. Stomach/Bowel: Unremarkable Vascular/Lymphatic: A retrocaval node measures 0.9 cm in short axis on image 73/1104. Additional smaller retroperitoneal lymph nodes are also present. Other:  No supplemental non-categorized findings. Musculoskeletal: Unremarkable IMPRESSION: 1. There is a stone in the common bile duct about 1.9 cm proximal to the ampulla, measuring 10 by 8 mm in diameter. This is causing obstruction, with moderate intrahepatic and extrahepatic biliary dilatation. There is also enhancement of the walls of the biliary tree raising the possibility of mild cholangitis. 2. Slight irregularity of the dorsal pancreatic duct likely a manifestation of the patient's recent pancreatitis. 3. Severely atrophic right kidney. 4. Upper normal size retrocaval lymph node on the right is likely reactive. 5. Mild cardiomegaly. Radiology assistant personnel have been notified to put me in telephone contact with the referring physician or the referring physician's clinical representative in order to discuss these findings. Once this communication is established I will issue an addendum to this report for documentation  purposes. Electronically Signed: By: Gaylyn Rong M.D. On: 07/25/2018 09:24   Dg Ercp With Sphincterotomy  Result Date: 07/26/2018 CLINICAL DATA:  Choledocholithiasis EXAM: ERCP TECHNIQUE: Multiple spot images obtained with the fluoroscopic device and submitted for interpretation post-procedure. COMPARISON:  MRCP 07/25/2018 FINDINGS: A series of fluoroscopic spot images document endoscopic cannulation and opacification biliary tree with subsequent passage of a balloon catheter through the CBD. Intrahepatic biliary tree incompletely opacified, appearing mildly distended centrally. Ectatic cystic duct remnant. No definite leak. IMPRESSION: 1. Endoscopic CBD cannulation and intervention. 2. Ectatic cystic duct stump. These images were submitted for radiologic interpretation only. Please see the procedural report for the amount of contrast and the fluoroscopy time utilized. Electronically Signed   By: Corlis Leak M.D.   On: 07/26/2018 10:15   Mr Abdomen Mrcp Vivien Rossetti Contast  Addendum Date: 07/25/2018   ADDENDUM REPORT: 07/25/2018 11:12 ADDENDUM: The original report was by Dr. Gaylyn Rong. The following addendum is by Dr. Gaylyn Rong: The impressions from the report were called by technologist Kendal Hymen to Dr.Duncan this morning. Dr. Para March passed on instructions for the patient to proceed to the emergency  room. I had gotten called away to do a procedure and accordingly was not able to reach Dr. Para March myself, but I am available to discuss the case further if needed. Electronically Signed   By: Gaylyn Rong M.D.   On: 07/25/2018 11:12   Result Date: 07/25/2018 CLINICAL DATA:  Cholecystectomy on 06/19/2018.  Acute pancreatitis. EXAM: MRI ABDOMEN WITHOUT AND WITH CONTRAST (INCLUDING MRCP) TECHNIQUE: Multiplanar multisequence MR imaging of the abdomen was performed both before and after the administration of intravenous contrast. Heavily T2-weighted images of the biliary and pancreatic ducts  were obtained, and three-dimensional MRCP images were rendered by post processing. CONTRAST:  9 cc Gadavist COMPARISON:  Ultrasound 05/03/2018. FINDINGS: Lower chest: Mild cardiomegaly. Hepatobiliary: Cholecystectomy with 1.8 by 1.3 cm small fluid signal intensity along the gallbladder fossa likely reflecting a small postoperative fluid collection. There is moderate intrahepatic biliary dilatation with a distended cystic duct remnant up to 1.4 cm in diameter, and a common hepatic duct at 1.1 cm in diameter. The common bile duct is likewise dilated up to about 0.8 cm in diameter, extending down to a 10 by 8 mm in diameter filling defect in the CBD on image 68/400 compatible with stone. This is about 1.9 cm proximal to the ampulla and is believed to be causing biliary obstruction. There is mildly accentuated enhancement along the margins of the bile ducts raising the possibility of cholangitis. Pancreas: Slight irregularity of the dorsal pancreatic duct especially in the pancreatic head may be a manifestation of the patient's recent pancreatitis. No findings of pancreatic necrosis, pseudocyst, or abscess. We do not demonstrate a prominent degree of peripancreatic inflammatory stranding currently. Spleen:  Unremarkable Adrenals/Urinary Tract: Severely atrophic right kidney is likely not contributing much in the way of relative functional renal mass. Both adrenal glands are unremarkable. Small benign cyst of the left kidney lower pole. Stomach/Bowel: Unremarkable Vascular/Lymphatic: A retrocaval node measures 0.9 cm in short axis on image 73/1104. Additional smaller retroperitoneal lymph nodes are also present. Other:  No supplemental non-categorized findings. Musculoskeletal: Unremarkable IMPRESSION: 1. There is a stone in the common bile duct about 1.9 cm proximal to the ampulla, measuring 10 by 8 mm in diameter. This is causing obstruction, with moderate intrahepatic and extrahepatic biliary dilatation. There is also  enhancement of the walls of the biliary tree raising the possibility of mild cholangitis. 2. Slight irregularity of the dorsal pancreatic duct likely a manifestation of the patient's recent pancreatitis. 3. Severely atrophic right kidney. 4. Upper normal size retrocaval lymph node on the right is likely reactive. 5. Mild cardiomegaly. Radiology assistant personnel have been notified to put me in telephone contact with the referring physician or the referring physician's clinical representative in order to discuss these findings. Once this communication is established I will issue an addendum to this report for documentation purposes. Electronically Signed: By: Gaylyn Rong M.D. On: 07/25/2018 09:24        Scheduled Meds: . amLODipine  10 mg Oral Daily  . lisinopril  20 mg Oral Daily   And  . hydrochlorothiazide  12.5 mg Oral Daily  . indomethacin  100 mg Rectal Once  . insulin aspart  0-9 Units Subcutaneous TID WC  . pantoprazole  40 mg Oral Daily  . simvastatin  10 mg Oral q1800   Continuous Infusions: . sodium chloride 75 mL/hr at 07/26/18 1332  . potassium chloride 10 mEq (07/26/18 1322)     LOS: 1 day    Time spent: More than 50% of  that time was spent in counseling and/or coordination of care.      Burnadette Pop, MD [facility] Pager [phone]  If 7PM-7AM, please contact night-coverage [email or web address] Password [facility] 07/26/2018, 2:17 PM

## 2018-07-26 NOTE — Telephone Encounter (Signed)
Patient advised.

## 2018-07-26 NOTE — Anesthesia Procedure Notes (Signed)
Procedure Name: Intubation Date/Time: 07/26/2018 8:49 AM Performed by: Thornell MuleStubblefield, Hattye Siegfried G, CRNA Pre-anesthesia Checklist: Patient identified, Emergency Drugs available, Suction available and Patient being monitored Patient Re-evaluated:Patient Re-evaluated prior to induction Oxygen Delivery Method: Circle system utilized Preoxygenation: Pre-oxygenation with 100% oxygen Induction Type: IV induction Ventilation: Mask ventilation without difficulty Laryngoscope Size: Miller and 3 Grade View: Grade I Tube type: Oral Number of attempts: 1 Airway Equipment and Method: Stylet and Oral airway Placement Confirmation: ETT inserted through vocal cords under direct vision,  positive ETCO2 and breath sounds checked- equal and bilateral Secured at: 22 cm Tube secured with: Tape Dental Injury: Teeth and Oropharynx as per pre-operative assessment

## 2018-07-26 NOTE — Op Note (Signed)
[facility] Patient Name: Raymond Willis Procedure Date: 07/26/2018 MRN: [id number] Attending MD: [name] , MD Date of Birth: 01-02-1961 CSN: [id number] Age: 57 Admit Type: Outpatient Procedure:                ERCP Indications:              Abnormal MRCP, Elevated liver enzymes Providers:                [name], MD, Dwain Sarna, RN, Harrington Challenger,                            Technician, Roni Bread, CRNA Referring MD:             [facility] Medicines:                General Anesthesia, Cipro 400 mg IV, Indomethacin                            100 mg PR Complications:            No immediate complications. Estimated Blood Loss:     Estimated blood loss was minimal. Procedure:                Pre-Anesthesia Assessment:                           - Prior to the procedure, a History and Physical                            was performed, and patient medications and                            allergies were reviewed. The patient's tolerance of                            previous anesthesia was also reviewed. The risks                            and benefits of the procedure and the sedation                            options and risks were discussed with the patient.                            All questions were answered, and informed consent                            was obtained. Prior Anticoagulants: The patient has                            taken no previous anticoagulant or antiplatelet                            agents. ASA Grade Assessment: II - A patient with  mild systemic disease. After reviewing the risks                            and benefits, the patient was deemed in                            satisfactory condition to undergo the procedure.                           After obtaining informed consent, the scope was                            passed under direct vision. Throughout the    procedure, the patient's blood pressure, pulse, and                            oxygen saturations were monitored continuously. The                            [id number]-[id number] ([id number]) Olympus ERCP was introduced                            through the mouth, and used to inject contrast into                            and used to inject contrast into the bile duct. The                            ERCP was accomplished without difficulty. The                            patient tolerated the procedure well. Scope In: Scope Out: Findings:      A scout film of the abdomen was obtained. Surgical clips, consistent       with a previous cholecystectomy, were seen in the area of the right       upper quadrant of the abdomen. The major papilla was normal. The bile       duct was deeply cannulated. Contrast was injected. I personally       interpreted the bile duct images. Ductal flow of contrast was adequate.       The cystic duct contained filling defect(s) thought to be a stone,       round, about 12mm in size. The intra-hepatic and extra-hepatic biliary       duct system was normal. A 6 mm biliary sphincterotomy was made with a       traction (standard) sphincterotome using blended current. There was no       post-sphincterotomy bleeding. To discover objects, the biliary tree was       swept with a 12 mm balloon starting at the upper third of the main bile       duct, middle third of the main bile duct and lower third of the main       duct. Debris was swept from the duct. Wire introduced into cystic duct,       balloon passed in cystic duct remnant proximal to stone; unable  to       remove stone; basket catheter unable to be passed into cystic duct       remnant. One 7 Fr by 5 cm plastic stent with a single external flap and       a single internal flap was placed 5 cm into the cystic duct remnant.       Bile flowed through the stent. The stent was in good position. Impression:               - The  major papilla appeared normal.                           - The cholangiogram was normal.                           - A filling defect consistent with a stone was seen                            on the cholangiogram.                           - The examination was suspicious for cystic duct                            stones.                           - A biliary sphincterotomy was performed.                           - The biliary tree was swept and nothing and debris                            were found.                           - One plastic stent was placed into the cystic duct.                           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 Moderate Sedation:      None Recommendation:           - Avoid aspirin and nonsteroidal anti-inflammatory                            medicines for 5 days.                           -  Watch for pancreatitis, bleeding, perforation,                            and cholangitis.                           - Continue present medications.                           Deboraha Sprang- [facility] will follow. If no immediate                            complications, hopefully home tomorrow.                           - I have discussed case with Dr. Magnus IvanBlackman, who did                            patient's cholecystectomy, as well as Dr. Ewing SchleinMagod of                            our practice. Plan is to recheck LFTs early next                            week as outpatient, and then consider ERCP with                            cholangioscopy of cystic duct remnant ([employer])                            with Dr. Ewing SchleinMagod, for consideration of direct                            electrohydraulic lithotripsy. Procedure Code(s):        --- Professional ---                           [id number],  Endoscopic retrograde                            cholangiopancreatography (ERCP); with placement of                            endoscopic stent into biliary or pancreatic duct,                            including pre- and post-dilation and guide wire                            passage, when performed, including sphincterotomy,                            when performed, each stent                           [id number], Endoscopic  retrograde                            cholangiopancreatography (ERCP); with removal of                            calculi/debris from biliary/pancreatic duct(s) Diagnosis Code(s):        --- Professional ---                           R93.2, Abnormal findings on diagnostic imaging of                            liver and biliary tract                           R74.8, Abnormal levels of other serum enzymes CPT copyright 2018 [employer]. All rights reserved. The codes documented in this report are preliminary and upon coder review may  be revised to meet current compliance requirements. [name], MD 07/26/2018 10:20:46 AM This report has been signed electronically. Number of Addenda: 0

## 2018-07-26 NOTE — Telephone Encounter (Signed)
Pt called office stating he had a procedure today, which he was sent by Dr.Duncan yesterday. He says they were not able to get the stone. Pt states he keeps getting finger pricks, and they are saying he has sugar level issues. The in patient team is offering insulin. Please advise.

## 2018-07-26 NOTE — Transfer of Care (Signed)
Immediate Anesthesia Transfer of Care Note  Patient: Raymond Willis  Procedure(s) Performed: ENDOSCOPIC RETROGRADE CHOLANGIOPANCREATOGRAPHY (ERCP) (N/A )  Patient Location: PACU  Anesthesia Type:General  Level of Consciousness: awake, alert  and oriented  Airway & Oxygen Therapy: Patient Spontanous Breathing and Patient connected to face mask oxygen  Post-op Assessment: Report given to RN and Post -op Vital signs reviewed and stable  Post vital signs: Reviewed and stable  Last Vitals:  Vitals Value Taken Time  BP    Temp    Pulse 77 07/26/2018 10:14 AM  Resp 5 07/26/2018 10:14 AM  SpO2 100 % 07/26/2018 10:14 AM  Vitals shown include unvalidated device data.  Last Pain:  Vitals:   07/26/18 0809  TempSrc: Oral  PainSc: 0-No pain         Complications: No apparent anesthesia complications

## 2018-07-26 NOTE — Telephone Encounter (Signed)
I agree with Lugene- that needs to come through the GI/inpatient team that is managing this.  I don't have an answer to give him on this.

## 2018-07-27 LAB — CBC WITH DIFFERENTIAL/PLATELET
ABS IMMATURE GRANULOCYTES: 0.04 10*3/uL (ref 0.00–0.07)
Basophils Absolute: 0 10*3/uL (ref 0.0–0.1)
Basophils Relative: 0 %
Eosinophils Absolute: 0 10*3/uL (ref 0.0–0.5)
Eosinophils Relative: 0 %
HCT: 39.9 % (ref 39.0–52.0)
HEMOGLOBIN: 13.2 g/dL (ref 13.0–17.0)
Immature Granulocytes: 0 %
Lymphocytes Relative: 24 %
Lymphs Abs: 2.5 10*3/uL (ref 0.7–4.0)
MCH: 28.4 pg (ref 26.0–34.0)
MCHC: 33.1 g/dL (ref 30.0–36.0)
MCV: 86 fL (ref 80.0–100.0)
MONO ABS: 0.7 10*3/uL (ref 0.1–1.0)
MONOS PCT: 7 %
Neutro Abs: 7.1 10*3/uL (ref 1.7–7.7)
Neutrophils Relative %: 69 %
Platelets: 285 10*3/uL (ref 150–400)
RBC: 4.64 MIL/uL (ref 4.22–5.81)
RDW: 14.7 % (ref 11.5–15.5)
WBC: 10.4 10*3/uL (ref 4.0–10.5)
nRBC: 0 % (ref 0.0–0.2)

## 2018-07-27 LAB — COMPREHENSIVE METABOLIC PANEL
ALBUMIN: 3.5 g/dL (ref 3.5–5.0)
ALK PHOS: 306 U/L — AB (ref 38–126)
ALT: 354 U/L — AB (ref 0–44)
AST: 167 U/L — AB (ref 15–41)
Anion gap: 8 (ref 5–15)
BILIRUBIN TOTAL: 5.8 mg/dL — AB (ref 0.3–1.2)
BUN: 13 mg/dL (ref 6–20)
CALCIUM: 8.6 mg/dL — AB (ref 8.9–10.3)
CO2: 25 mmol/L (ref 22–32)
Chloride: 105 mmol/L (ref 98–111)
Creatinine, Ser: 1.22 mg/dL (ref 0.61–1.24)
GFR calc Af Amer: >60 mL/min (ref >60)
GFR calc non Af Amer: >60 mL/min (ref >60)
GLUCOSE: 119 mg/dL — AB (ref 70–99)
Potassium: 3.6 mmol/L (ref 3.5–5.1)
Sodium: 138 mmol/L (ref 135–145)
TOTAL PROTEIN: 7 g/dL (ref 6.5–8.1)

## 2018-07-27 LAB — GLUCOSE, CAPILLARY: Glucose-Capillary: 104 mg/dL — ABNORMAL HIGH (ref 70–99)

## 2018-07-27 NOTE — Progress Notes (Signed)
Patient feels well after yesterday's ERCP.  No abdominal pain.  Eating popsicle when I came in the room.    Exam:  Abdomen is nontender, sitting up in chair in no distress.  Impression:    1. Stable status post ERCP and sphincterotomy and cystic duct stent placement, with stone having migrated to, and now lodged in, the cystic duct 2.  Elevated liver chemistries 3.  Recent cholecystectomy with subsequent elevation of liver chemistries, gallstone pancreatitis, and CBD stone  Recommend:  1.  Reviewed anatomy and current status with patient and wife at the bedside 2.  Labs from this morning are pending, but unless they show worsening of his liver chemistries following yesterday's procedure, I think discharge today is appropriate in view of the fact that he is clinically stable.  His stone in the common duct should not lead to acute problems, unless it spontaneously migrates back into the common bile duct. 3. In the meantime, agree with advancement of diet. 4.  Have advised the patient to contact our office on Monday to have Dr. Dulce Sellarutlaw arrange outpatient lab testing and office follow-up at his discretion--have given him our card  Please call if you have any questions.  Florencia Reasonsobert V. Susie Pousson, M.D. Pager [phone] If no answer or after 5 PM call [phone]

## 2018-07-27 NOTE — Progress Notes (Signed)
Discharge teaching completed with teach back. Pt. understands to call GI MD on Monday for follow up appointment. No prescriptions given. Pt. to continue a lite regular diet per MD. Pt. Discharging to home.

## 2018-07-27 NOTE — Discharge Summary (Signed)
Physician Discharge Summary  Raymond Willis ZOX:[id number] DOB: [birth year] DOA: 07/25/2018  PCP: Joaquim Nam, MD  Admit date: 07/25/2018 Discharge date: 07/27/2018  Admitted From: Home Disposition:  Home  Discharge Condition:Stable CODE STATUS:FULL Diet recommendation: Heart Healthy   Brief/Interim Summary: Patient is a 57 year old male with past medical history of gout, hypertension, hyperlipidemia, recent cholecystectomy who presents to the hospital with complaints of abdominal pain.  He had underwent laparoscopic cholecystectomy on 06/19/2018.  After the procedure he has been noticing increasing abdominal bloating, nausea also had few episodes of vomiting.  Upon visiting his PCP, he was found to have elevated LFTs and total bilirubin so he was sent to the hospital for further evaluation.  MRCP was done which showed stone in the common bile duct causing obstruction with moderate intra-and extrahepatic bile dilation s.  GI consulted. Underwent ERCP .  He underwent stent placement in the cystic duct.  Patient's abdominal pain has resolved.  His liver enzymes have not worsened.  He is stable for discharge to home today.  He will follow-up with GI as an outpatient.  Following problems were addressed during his hospitalization:  CBD/cystic  stone causing transaminitis/elevated total bilirubin: Underwent ERCP by GI .  Underwent biliary sphincterectomy.  Plastic stent was placed into the cystic duct. He will be considered ERCP with cholangioscopy of the cystic duct remnant for consideration of electrohydraulic lithotripsy(suspicion is that the impacted stone was pushed into the cystic duct during the procedure) He is status post laparascopic cholecystectomy. CMP today showed elevation of liver enzymes but they have not worsened since yesterday.  Abdominal pain: From CBD obstruction.  Abdomen pain resolved.  Diet advanced.  Hypertension: Currently blood pressure stable.  Continue his home  medications.  GERD: Continue Protonix  Hyperlipidemia: Continue Zocor  Gout: Continue colchicine  Hypokalemia: Supplemented with potassium    Discharge Diagnoses:  Principal Problem:   Abdominal pain Active Problems:   Essential hypertension   Hyperlipidemia   Gout   GERD (gastroesophageal reflux disease)   Epigastric pain   LFT elevation    Discharge Instructions  Discharge Instructions    Diet - low sodium heart healthy   Complete by:  As directed    Discharge instructions   Complete by:  As directed    1) Please follow up with gastroenterology as an outpatient.   Increase activity slowly   Complete by:  As directed      Allergies as of 07/27/2018      Reactions   Penicillins Hives   Has patient had a PCN reaction causing immediate rash, facial/tongue/throat swelling, SOB or lightheadedness with hypotension: unkn Has patient had a PCN reaction causing severe rash involving mucus membranes or skin necrosis: unkn Has patient had a PCN reaction that required hospitalization: unkn Has patient had a PCN reaction occurring within the last 10 years: no If all of the above answers are "NO", then may proceed with Cephalosporin use.   Sildenafil Nausea And Vomiting   Possible cause of GI upset      Medication List    TAKE these medications   acetaminophen 500 MG tablet Commonly known as:  TYLENOL Take 1,000 mg by mouth every 6 (six) hours as needed for mild pain.   amLODipine 10 MG tablet Commonly known as:  NORVASC Take 1 tablet (10 mg total) by mouth daily.   colchicine 0.6 MG tablet Take 1 tablet (0.6 mg total) by mouth daily as needed. Okay to fill with mitigare or colcrys if  cheaper. What changed:    reasons to take this  additional instructions   diclofenac sodium 1 % Gel Commonly known as:  VOLTAREN Apply 2 g topically daily.   lisinopril-hydrochlorothiazide 20-12.5 MG tablet Commonly known as:  PRINZIDE,ZESTORETIC Take 1 tablet by mouth  daily.   omeprazole 20 MG capsule Commonly known as:  PRILOSEC Take 1 capsule (20 mg total) by mouth 2 (two) times daily before a meal. What changed:  when to take this   ondansetron 4 MG tablet Commonly known as:  ZOFRAN Take 1 tablet (4 mg total) by mouth every 8 (eight) hours as needed for nausea or vomiting.   simvastatin 10 MG tablet Commonly known as:  ZOCOR Take 1 tablet (10 mg total) by mouth daily at 6 PM.   triamcinolone cream 0.5 % Commonly known as:  KENALOG Apply 1 application topically 2 (two) times daily as needed. What changed:  reasons to take this      Follow-up Information    Joaquim Namuncan, [name], MD. Schedule an appointment as soon as possible for a visit in 1 week(s).   Specialty:  Family Medicine Contact information: [address] [place] [address] [phone]          Allergies  Allergen Reactions  . Penicillins Hives    Has patient had a PCN reaction causing immediate rash, facial/tongue/throat swelling, SOB or lightheadedness with hypotension: unkn Has patient had a PCN reaction causing severe rash involving mucus membranes or skin necrosis: unkn Has patient had a PCN reaction that required hospitalization: unkn Has patient had a PCN reaction occurring within the last 10 years: no If all of the above answers are "NO", then may proceed with Cephalosporin use.   . Sildenafil Nausea And Vomiting    Possible cause of GI upset    Consultations:  GI   Procedures/Studies: Mr 3d Recon At Scanner  Addendum Date: 07/25/2018   ADDENDUM REPORT: 07/25/2018 11:12 ADDENDUM: The original report was by Dr. Gaylyn RongWalter Liebkemann. The following addendum is by Dr. Gaylyn RongWalter Liebkemann: The impressions from the report were called by technologist Kendal HymenBonnie to Dr.Duncan this morning. Dr. Para Marchuncan passed on instructions for the patient to proceed to the emergency room. I had gotten called away to do a procedure and accordingly was not able to reach Dr. Para Marchuncan  myself, but I am available to discuss the case further if needed. Electronically Signed   By: Gaylyn RongWalter  Liebkemann M.D.   On: 07/25/2018 11:12   Result Date: 07/25/2018 CLINICAL DATA:  Cholecystectomy on 06/19/2018.  Acute pancreatitis. EXAM: MRI ABDOMEN WITHOUT AND WITH CONTRAST (INCLUDING MRCP) TECHNIQUE: Multiplanar multisequence MR imaging of the abdomen was performed both before and after the administration of intravenous contrast. Heavily T2-weighted images of the biliary and pancreatic ducts were obtained, and three-dimensional MRCP images were rendered by post processing. CONTRAST:  9 cc Gadavist COMPARISON:  Ultrasound 05/03/2018. FINDINGS: Lower chest: Mild cardiomegaly. Hepatobiliary: Cholecystectomy with 1.8 by 1.3 cm small fluid signal intensity along the gallbladder fossa likely reflecting a small postoperative fluid collection. There is moderate intrahepatic biliary dilatation with a distended cystic duct remnant up to 1.4 cm in diameter, and a common hepatic duct at 1.1 cm in diameter. The common bile duct is likewise dilated up to about 0.8 cm in diameter, extending down to a 10 by 8 mm in diameter filling defect in the CBD on image 68/400 compatible with stone. This is about 1.9 cm proximal to the ampulla and is believed to be causing  biliary obstruction. There is mildly accentuated enhancement along the margins of the bile ducts raising the possibility of cholangitis. Pancreas: Slight irregularity of the dorsal pancreatic duct especially in the pancreatic head may be a manifestation of the patient's recent pancreatitis. No findings of pancreatic necrosis, pseudocyst, or abscess. We do not demonstrate a prominent degree of peripancreatic inflammatory stranding currently. Spleen:  Unremarkable Adrenals/Urinary Tract: Severely atrophic right kidney is likely not contributing much in the way of relative functional renal mass. Both adrenal glands are unremarkable. Small benign cyst of the left kidney  lower pole. Stomach/Bowel: Unremarkable Vascular/Lymphatic: A retrocaval node measures 0.9 cm in short axis on image 73/1104. Additional smaller retroperitoneal lymph nodes are also present. Other:  No supplemental non-categorized findings. Musculoskeletal: Unremarkable IMPRESSION: 1. There is a stone in the common bile duct about 1.9 cm proximal to the ampulla, measuring 10 by 8 mm in diameter. This is causing obstruction, with moderate intrahepatic and extrahepatic biliary dilatation. There is also enhancement of the walls of the biliary tree raising the possibility of mild cholangitis. 2. Slight irregularity of the dorsal pancreatic duct likely a manifestation of the patient's recent pancreatitis. 3. Severely atrophic right kidney. 4. Upper normal size retrocaval lymph node on the right is likely reactive. 5. Mild cardiomegaly. Radiology assistant personnel have been notified to put me in telephone contact with the referring physician or the referring physician's clinical representative in order to discuss these findings. Once this communication is established I will issue an addendum to this report for documentation purposes. Electronically Signed: By: Gaylyn Rong M.D. On: 07/25/2018 09:24   Dg Ercp With Sphincterotomy  Result Date: 07/26/2018 CLINICAL DATA:  Choledocholithiasis EXAM: ERCP TECHNIQUE: Multiple spot images obtained with the fluoroscopic device and submitted for interpretation post-procedure. COMPARISON:  MRCP 07/25/2018 FINDINGS: A series of fluoroscopic spot images document endoscopic cannulation and opacification biliary tree with subsequent passage of a balloon catheter through the CBD. Intrahepatic biliary tree incompletely opacified, appearing mildly distended centrally. Ectatic cystic duct remnant. No definite leak. IMPRESSION: 1. Endoscopic CBD cannulation and intervention. 2. Ectatic cystic duct stump. These images were submitted for radiologic interpretation only. Please see  the procedural report for the amount of contrast and the fluoroscopy time utilized. Electronically Signed   By: Corlis Leak M.D.   On: 07/26/2018 10:15   Mr Abdomen Mrcp Vivien Rossetti Contast  Addendum Date: 07/25/2018   ADDENDUM REPORT: 07/25/2018 11:12 ADDENDUM: The original report was by Dr. Gaylyn Rong. The following addendum is by Dr. Gaylyn Rong: The impressions from the report were called by technologist Kendal Hymen to Dr.Duncan this morning. Dr. Para March passed on instructions for the patient to proceed to the emergency room. I had gotten called away to do a procedure and accordingly was not able to reach Dr. Para March myself, but I am available to discuss the case further if needed. Electronically Signed   By: Gaylyn Rong M.D.   On: 07/25/2018 11:12   Result Date: 07/25/2018 CLINICAL DATA:  Cholecystectomy on 06/19/2018.  Acute pancreatitis. EXAM: MRI ABDOMEN WITHOUT AND WITH CONTRAST (INCLUDING MRCP) TECHNIQUE: Multiplanar multisequence MR imaging of the abdomen was performed both before and after the administration of intravenous contrast. Heavily T2-weighted images of the biliary and pancreatic ducts were obtained, and three-dimensional MRCP images were rendered by post processing. CONTRAST:  9 cc Gadavist COMPARISON:  Ultrasound 05/03/2018. FINDINGS: Lower chest: Mild cardiomegaly. Hepatobiliary: Cholecystectomy with 1.8 by 1.3 cm small fluid signal intensity along the gallbladder fossa likely reflecting a small postoperative  fluid collection. There is moderate intrahepatic biliary dilatation with a distended cystic duct remnant up to 1.4 cm in diameter, and a common hepatic duct at 1.1 cm in diameter. The common bile duct is likewise dilated up to about 0.8 cm in diameter, extending down to a 10 by 8 mm in diameter filling defect in the CBD on image 68/400 compatible with stone. This is about 1.9 cm proximal to the ampulla and is believed to be causing biliary obstruction. There is mildly  accentuated enhancement along the margins of the bile ducts raising the possibility of cholangitis. Pancreas: Slight irregularity of the dorsal pancreatic duct especially in the pancreatic head may be a manifestation of the patient's recent pancreatitis. No findings of pancreatic necrosis, pseudocyst, or abscess. We do not demonstrate a prominent degree of peripancreatic inflammatory stranding currently. Spleen:  Unremarkable Adrenals/Urinary Tract: Severely atrophic right kidney is likely not contributing much in the way of relative functional renal mass. Both adrenal glands are unremarkable. Small benign cyst of the left kidney lower pole. Stomach/Bowel: Unremarkable Vascular/Lymphatic: A retrocaval node measures 0.9 cm in short axis on image 73/1104. Additional smaller retroperitoneal lymph nodes are also present. Other:  No supplemental non-categorized findings. Musculoskeletal: Unremarkable IMPRESSION: 1. There is a stone in the common bile duct about 1.9 cm proximal to the ampulla, measuring 10 by 8 mm in diameter. This is causing obstruction, with moderate intrahepatic and extrahepatic biliary dilatation. There is also enhancement of the walls of the biliary tree raising the possibility of mild cholangitis. 2. Slight irregularity of the dorsal pancreatic duct likely a manifestation of the patient's recent pancreatitis. 3. Severely atrophic right kidney. 4. Upper normal size retrocaval lymph node on the right is likely reactive. 5. Mild cardiomegaly. Radiology assistant personnel have been notified to put me in telephone contact with the referring physician or the referring physician's clinical representative in order to discuss these findings. Once this communication is established I will issue an addendum to this report for documentation purposes. Electronically Signed: By: Gaylyn Rong M.D. On: 07/25/2018 09:24       Subjective: Patient seen and examined the bedside this morning.  Remains  comfortable.  No active issues.  Stable for discharge to home today.  Discharge Exam: Vitals:   07/27/18 0431 07/27/18 1028  BP: 113/76 122/84  Pulse: 69 67  Resp: 14   Temp: 98 F (36.7 C)   SpO2: 97%    Vitals:   07/26/18 1350 07/26/18 2120 07/27/18 0431 07/27/18 1028  BP: (!) 155/95 (!) 142/90 113/76 122/84  Pulse: 76 66 69 67  Resp: 20 16 14    Temp: 98.4 F (36.9 C) 98.2 F (36.8 C) 98 F (36.7 C)   TempSrc: Oral Oral Oral   SpO2: 95% 97% 97%   Weight:      Height:        General: Pt is alert, awake, not in acute distress Cardiovascular: RRR, S1/S2 +, no rubs, no gallops Respiratory: CTA bilaterally, no wheezing, no rhonchi Abdominal: Soft, NT, ND, bowel sounds + Extremities: no edema, no cyanosis    The results of significant diagnostics from this hospitalization (including imaging, microbiology, ancillary and laboratory) are listed below for reference.     Microbiology: Recent Results (from the past 240 hour(s))  Surgical pcr screen     Status: None   Collection Time: 07/25/18  9:35 PM  Result Value Ref Range Status   MRSA, PCR NEGATIVE NEGATIVE Final   Staphylococcus aureus NEGATIVE NEGATIVE Final  Comment: (NOTE) The Xpert SA Assay (FDA approved for NASAL specimens in patients 73 years of age and older), is one component of a comprehensive surveillance program. It is not intended to diagnose infection nor to guide or monitor treatment. Performed at [facility], [address] [place], [place] [address]      Labs: BNP (last 3 results) No results for input(s): BNP in the last 8760 hours. Basic Metabolic Panel: Recent Labs  Lab 07/22/18 1526 07/25/18 1148 07/26/18 0538 07/26/18 1831 07/27/18 0814  NA 140 140 140 137 138  K 3.9 3.3* 3.4* 4.0 3.6  CL 101 99 104 103 105  CO2 26 29 27 27 25   GLUCOSE 102* 94 110* 168* 119*  BUN 14 14 12 12 13   CREATININE 1.50 1.08 6.04 1.23 1.22  CALCIUM 9.7 9.5 8.6* 8.9 8.6*   Liver  Function Tests: Recent Labs  Lab 07/22/18 1526 07/25/18 1148 07/26/18 0538 07/26/18 1831 07/27/18 0814  AST 79* 194* 165* 167* 167*  ALT 244* 353* 318* 351* 354*  ALKPHOS 262* 328* 300* 341* 306*  BILITOT 8.6* 12.2* 10.2* 8.7* 5.8*  PROT 7.7 8.1 6.5 7.6 7.0  ALBUMIN 4.3 4.1 3.3* 3.4* 3.5   Recent Labs  Lab 07/22/18 1526 07/25/18 1148  LIPASE 49.0 43   No results for input(s): AMMONIA in the last 168 hours. CBC: Recent Labs  Lab 07/22/18 1526 07/25/18 1148 07/26/18 0538 07/27/18 0814  WBC 6.8 5.6 5.0 10.4  NEUTROABS 3.4 2.8  --  7.1  HGB 14.7 14.7 13.2 13.2  HCT 43.7 44.7 40.3 39.9  MCV 83.8 86.3 84.1 86.0  PLT 259.0 298 275 285   Cardiac Enzymes: No results for input(s): CKTOTAL, CKMB, CKMBINDEX, TROPONINI in the last 168 hours. BNP: Invalid input(s): POCBNP CBG: Recent Labs  Lab 07/26/18 0756 07/26/18 1213 07/26/18 1612 07/26/18 2107 07/27/18 0759  GLUCAP 81 145* 135* 120* 104*   D-Dimer No results for input(s): DDIMER in the last 72 hours. Hgb A1c No results for input(s): HGBA1C in the last 72 hours. Lipid Profile No results for input(s): CHOL, HDL, LDLCALC, TRIG, CHOLHDL, LDLDIRECT in the last 72 hours. Thyroid function studies No results for input(s): TSH, T4TOTAL, T3FREE, THYROIDAB in the last 72 hours.  Invalid input(s): FREET3 Anemia work up No results for input(s): VITAMINB12, FOLATE, FERRITIN, TIBC, IRON, RETICCTPCT in the last 72 hours. Urinalysis    Component Value Date/Time   COLORURINE AMBER (A) 07/25/2018 1651   APPEARANCEUR CLEAR 07/25/2018 1651   LABSPEC 1.027 07/25/2018 1651   PHURINE 6.0 07/25/2018 1651   GLUCOSEU NEGATIVE 07/25/2018 1651   HGBUR NEGATIVE 07/25/2018 1651   BILIRUBINUR MODERATE (A) 07/25/2018 1651   BILIRUBINUR 2+ 07/22/2018 1530   KETONESUR 5 (A) 07/25/2018 1651   PROTEINUR 30 (A) 07/25/2018 1651   UROBILINOGEN 1.0 07/22/2018 1530   UROBILINOGEN 0.2 09/25/2014 1300   NITRITE NEGATIVE 07/25/2018 1651    LEUKOCYTESUR NEGATIVE 07/25/2018 1651   Sepsis Labs Invalid input(s): PROCALCITONIN,  WBC,  LACTICIDVEN Microbiology Recent Results (from the past 240 hour(s))  Surgical pcr screen     Status: None   Collection Time: 07/25/18  9:35 PM  Result Value Ref Range Status   MRSA, PCR NEGATIVE NEGATIVE Final   Staphylococcus aureus NEGATIVE NEGATIVE Final    Comment: (NOTE) The Xpert SA Assay (FDA approved for NASAL specimens in patients 2 years of age and older), is one component of a comprehensive surveillance program. It is not intended to diagnose infection nor to  guide or monitor treatment. Performed at [facility], [address] [place], [place] [address]     Please note: You were cared for by a hospitalist during your hospital stay. Once you are discharged, your primary care physician will handle any further medical issues. Please note that NO REFILLS for any discharge medications will be authorized once you are discharged, as it is imperative that you return to your primary care physician (or establish a relationship with a primary care physician if you do not have one) for your post hospital discharge needs so that they can reassess your need for medications and monitor your lab values.    Time coordinating discharge: 40 minutes  SIGNED:   Burnadette Pop, MD  [facility] 07/27/2018, 11:16 AM Pager [phone]  If 7PM-7AM, please contact night-coverage [email or web address] Password [email or web address]

## 2018-07-29 ENCOUNTER — Encounter (HOSPITAL_COMMUNITY): Payer: Self-pay | Admitting: Gastroenterology

## 2018-07-29 ENCOUNTER — Telehealth: Payer: Self-pay

## 2018-07-29 LAB — GLUCOSE, CAPILLARY: GLUCOSE-CAPILLARY: 120 mg/dL — AB (ref 70–99)

## 2018-07-29 NOTE — Telephone Encounter (Signed)
Left message for patient to call back to do TCM call and needs an appointment

## 2018-07-30 ENCOUNTER — Telehealth: Payer: Self-pay

## 2018-07-30 NOTE — Telephone Encounter (Signed)
Transitional Care Management Follow-up Telephone Call    Date discharged? 07/29/18  How have you been since you were released from the hospital? Recovering. Episode of bloating.   Any patient concerns? Concerned that surgeon was unable to remove gallstone.    Items Reviewed:  Medications reviewed: Yes  Allergies reviewed: Yes  Dietary changes reviewed: Yes, heart healthy  Referrals reviewed: Yes, lab work today with GI   Functional Questionnaire:  Independent - I Dependent - D    Activities of Daily Living (ADLs):    Personal hygiene - I Dressing - I Eating - I Maintaining continence - I Transferring - I   Independent Activities of Daily Living (iADLs): Basic communication skills - I Transportation - I Meal preparation  - I Shopping - I Housework - I Managing medications - I  Managing personal finances - I   Confirmed importance and date/time of follow-up visits scheduled YES  Provider Appointment booked with PCP 08/06/18 @ 1500  Confirmed with patient if condition begins to worsen call PCP or go to the ER.  Patient was given the office number and encouraged to call back with question or concerns: YES

## 2018-07-30 NOTE — Telephone Encounter (Signed)
Pt returned call

## 2018-07-30 NOTE — Telephone Encounter (Signed)
See other message- TCM done

## 2018-07-30 NOTE — Telephone Encounter (Signed)
Noted. Thanks.

## 2018-07-31 ENCOUNTER — Other Ambulatory Visit: Payer: Self-pay | Admitting: Gastroenterology

## 2018-08-02 ENCOUNTER — Other Ambulatory Visit: Payer: Self-pay

## 2018-08-02 ENCOUNTER — Encounter (HOSPITAL_COMMUNITY): Payer: Self-pay

## 2018-08-02 ENCOUNTER — Observation Stay (HOSPITAL_COMMUNITY)
Admission: EM | Admit: 2018-08-02 | Discharge: 2018-08-03 | Disposition: A | Discharge status: Home / Self Care | Payer: Managed Care, Other (non HMO) | Attending: Internal Medicine | Admitting: Internal Medicine

## 2018-08-02 DIAGNOSIS — N179 Acute kidney failure, unspecified: Secondary | ICD-10-CM

## 2018-08-02 DIAGNOSIS — I10 Essential (primary) hypertension: Secondary | ICD-10-CM | POA: Diagnosis present

## 2018-08-02 DIAGNOSIS — I119 Hypertensive heart disease without heart failure: Secondary | ICD-10-CM | POA: Diagnosis not present

## 2018-08-02 DIAGNOSIS — K831 Obstruction of bile duct: Secondary | ICD-10-CM | POA: Diagnosis not present

## 2018-08-02 DIAGNOSIS — R945 Abnormal results of liver function studies: Secondary | ICD-10-CM

## 2018-08-02 DIAGNOSIS — G4700 Insomnia, unspecified: Secondary | ICD-10-CM | POA: Insufficient documentation

## 2018-08-02 DIAGNOSIS — R7303 Prediabetes: Secondary | ICD-10-CM | POA: Diagnosis not present

## 2018-08-02 DIAGNOSIS — I9589 Other hypotension: Secondary | ICD-10-CM | POA: Insufficient documentation

## 2018-08-02 DIAGNOSIS — M109 Gout, unspecified: Secondary | ICD-10-CM | POA: Insufficient documentation

## 2018-08-02 DIAGNOSIS — R7989 Other specified abnormal findings of blood chemistry: Secondary | ICD-10-CM | POA: Diagnosis present

## 2018-08-02 DIAGNOSIS — K219 Gastro-esophageal reflux disease without esophagitis: Secondary | ICD-10-CM | POA: Diagnosis not present

## 2018-08-02 DIAGNOSIS — E785 Hyperlipidemia, unspecified: Secondary | ICD-10-CM | POA: Insufficient documentation

## 2018-08-02 DIAGNOSIS — Z9049 Acquired absence of other specified parts of digestive tract: Secondary | ICD-10-CM | POA: Diagnosis not present

## 2018-08-02 DIAGNOSIS — Z880 Allergy status to penicillin: Secondary | ICD-10-CM | POA: Insufficient documentation

## 2018-08-02 DIAGNOSIS — E559 Vitamin D deficiency, unspecified: Secondary | ICD-10-CM | POA: Insufficient documentation

## 2018-08-02 DIAGNOSIS — E7800 Pure hypercholesterolemia, unspecified: Secondary | ICD-10-CM | POA: Insufficient documentation

## 2018-08-02 DIAGNOSIS — Z79899 Other long term (current) drug therapy: Secondary | ICD-10-CM | POA: Insufficient documentation

## 2018-08-02 DIAGNOSIS — R1013 Epigastric pain: Secondary | ICD-10-CM | POA: Diagnosis present

## 2018-08-02 LAB — COMPREHENSIVE METABOLIC PANEL
ALT: 432 U/L — AB (ref 0–44)
AST: 211 U/L — AB (ref 15–41)
Albumin: 3.5 g/dL (ref 3.5–5.0)
Alkaline Phosphatase: 513 U/L — ABNORMAL HIGH (ref 38–126)
Anion gap: 13 (ref 5–15)
BUN: 18 mg/dL (ref 6–20)
CHLORIDE: 101 mmol/L (ref 98–111)
CO2: 22 mmol/L (ref 22–32)
CREATININE: 3.11 mg/dL — AB (ref 0.61–1.24)
Calcium: 9.4 mg/dL (ref 8.9–10.3)
GFR calc Af Amer: 24 mL/min — ABNORMAL LOW (ref >60)
GFR, EST NON AFRICAN AMERICAN: 21 mL/min — AB (ref >60)
Glucose, Bld: 93 mg/dL (ref 70–99)
Potassium: 4.9 mmol/L (ref 3.5–5.1)
Sodium: 136 mmol/L (ref 135–145)
Total Bilirubin: 12.7 mg/dL — ABNORMAL HIGH (ref 0.3–1.2)
Total Protein: 7.7 g/dL (ref 6.5–8.1)

## 2018-08-02 LAB — CBC WITH DIFFERENTIAL/PLATELET
Abs Immature Granulocytes: 0.03 10*3/uL (ref 0.00–0.07)
Basophils Absolute: 0 10*3/uL (ref 0.0–0.1)
Basophils Relative: 0 %
EOS PCT: 1 %
Eosinophils Absolute: 0.1 10*3/uL (ref 0.0–0.5)
HCT: 45.1 % (ref 39.0–52.0)
HEMOGLOBIN: 14.9 g/dL (ref 13.0–17.0)
Immature Granulocytes: 0 %
LYMPHS PCT: 31 %
Lymphs Abs: 2.3 10*3/uL (ref 0.7–4.0)
MCH: 27.9 pg (ref 26.0–34.0)
MCHC: 33 g/dL (ref 30.0–36.0)
MCV: 84.5 fL (ref 80.0–100.0)
MONO ABS: 1 10*3/uL (ref 0.1–1.0)
MONOS PCT: 13 %
Neutro Abs: 4.2 10*3/uL (ref 1.7–7.7)
Neutrophils Relative %: 55 %
Platelets: 489 10*3/uL — ABNORMAL HIGH (ref 150–400)
RBC: 5.34 MIL/uL (ref 4.22–5.81)
RDW: 14.6 % (ref 11.5–15.5)
WBC: 7.7 10*3/uL (ref 4.0–10.5)
nRBC: 0 % (ref 0.0–0.2)

## 2018-08-02 LAB — URINALYSIS, ROUTINE W REFLEX MICROSCOPIC
GLUCOSE, UA: NEGATIVE mg/dL
HGB URINE DIPSTICK: NEGATIVE
Ketones, ur: NEGATIVE mg/dL
Leukocytes, UA: NEGATIVE
Nitrite: NEGATIVE
PH: 5 (ref 5.0–8.0)
Protein, ur: NEGATIVE mg/dL
SPECIFIC GRAVITY, URINE: 1.01 (ref 1.005–1.030)

## 2018-08-02 LAB — LIPASE, BLOOD: LIPASE: 67 U/L — AB (ref 11–51)

## 2018-08-02 LAB — I-STAT CG4 LACTIC ACID, ED: Lactic Acid, Venous: 1.17 mmol/L (ref 0.5–1.9)

## 2018-08-02 MED ORDER — SODIUM CHLORIDE 0.9 % IV BOLUS
1000.0000 mL | Freq: Once | INTRAVENOUS | Status: AC
  Administered 2018-08-02: 1000 mL via INTRAVENOUS

## 2018-08-02 MED ORDER — ONDANSETRON HCL 4 MG/2ML IJ SOLN: 4.0000 mg | Freq: Four times a day (QID) | INTRAMUSCULAR | Status: DC | PRN

## 2018-08-02 MED ORDER — ONDANSETRON HCL 4 MG PO TABS: 4.0000 mg | ORAL_TABLET | Freq: Four times a day (QID) | ORAL | Status: DC | PRN

## 2018-08-02 MED ORDER — SODIUM CHLORIDE 0.9 % IV SOLN
INTRAVENOUS | Status: DC
  Administered 2018-08-03: 06:00:00 via INTRAVENOUS

## 2018-08-02 MED ORDER — MORPHINE SULFATE (PF) 2 MG/ML IV SOLN: 1.0000 mg | INTRAVENOUS | Status: DC | PRN

## 2018-08-02 MED ORDER — LEVOFLOXACIN IN D5W 500 MG/100ML IV SOLN
500.0000 mg | INTRAVENOUS | Status: DC
  Administered 2018-08-02: 500 mg via INTRAVENOUS
  Filled 2018-08-02: qty 100

## 2018-08-02 MED ORDER — HYDRALAZINE HCL 20 MG/ML IJ SOLN: 5.0000 mg | INTRAMUSCULAR | Status: DC | PRN

## 2018-08-02 MED ORDER — SODIUM CHLORIDE 0.9 % IV SOLN
INTRAVENOUS | Status: DC
  Administered 2018-08-02: 21:00:00 via INTRAVENOUS

## 2018-08-02 NOTE — H&P (Signed)
History and Physical    Raymond Willis:[id number] DOB: 29-Nov-1960 DOA: 08/02/2018  PCP: Joaquim Nam, MD  Patient coming from: Home.  Chief Complaint: Abdominal pain.  HPI: Raymond Willis is a 57 y.o. male with history of hypertension, hyperlipidemia, gout, prediabetes who was recently admitted for CBD stone and also had a recent cholecystectomy presents to the ER because of worsening abdominal pain.  Patient was recently discharged home following which patient did well for 2 days but the pain started again coming back in the epigastric area with nausea.  Has been on eating well since the pain started.  Since the pain persisted he called the GI office and was told to come to the ER.  His last meal was prior to coming.  He has not had any food for last 2 days because of the fear of pain.  After the recent ERCP and stent placement there was concern that the stone may have migrated and there was a plan to have redo ERCP with cholangioscopy of the cystic duct.  ED Course: In the ER patient's total bilirubin was 12.7.  Creatinine has worsened from normal to 1.2-3.1.  LFTs are 211 AST 432 ALT.  ER physician discussed with on-call [facility] gastroenterologist Dr. Doretha Imus who will be seeing patient in consult.  Patient was mildly hypotensive in the ER for which patient was given fluid bolus.  Review of Systems: As per HPI, rest all negative.   Past Medical History:  Diagnosis Date  . Erectile dysfunction   . Hypercholesteremia   . Hypertension   . Insomnia   . Prediabetes   . Vitamin D deficiency     Past Surgical History:  Procedure Laterality Date  . BILIARY STENT PLACEMENT N/A 07/26/2018   Procedure: BILIARY STENT PLACEMENT;  Surgeon: [name], MD;  Location: [facility];  Service: Endoscopy;  Laterality: N/A;  . CHOLECYSTECTOMY N/A 06/19/2018   Procedure: LAPAROSCOPIC CHOLECYSTECTOMY;  Surgeon: Abigail Miyamoto, MD;  Location[facility] OR;  Service: General;  Laterality:  N/A;  . COLONOSCOPY  04/2017  . COLONOSCOPY WITH ESOPHAGOGASTRODUODENOSCOPY (EGD)    . ERCP N/A 07/26/2018   Procedure: ENDOSCOPIC RETROGRADE CHOLANGIOPANCREATOGRAPHY (ERCP);  Surgeon: [name], MD;  Location: [facility];  Service: Endoscopy;  Laterality: N/A;  . IRRIGATION AND DEBRIDEMENT ABSCESS N/A 09/25/2014   Procedure: IRRIGATION AND DEBRIDEMENT ABSCESS;  Surgeon: Heloise Purpura, MD;  Location: [facility] OR;  Service: Urology;  Laterality: N/A;  . SCROTAL EXPLORATION N/A 09/25/2014   Procedure: SCROTUM EXPLORATION;  Surgeon: Heloise Purpura, MD;  Location: [facility] OR;  Service: Urology;  Laterality: N/A;  . SPHINCTEROTOMY  07/26/2018   Procedure: SPHINCTEROTOMY;  Surgeon: [name], MD;  Location: [facility]OSCOPY;  Service: Endoscopy;;  . TONSILLECTOMY       reports that he has never smoked. He has never used smokeless tobacco. He reports that he does not drink alcohol or use drugs.  Allergies  Allergen Reactions  . Penicillins Hives    Has patient had a PCN reaction causing immediate rash, facial/tongue/throat swelling, SOB or lightheadedness with hypotension: unkn Has patient had a PCN reaction causing severe rash involving mucus membranes or skin necrosis: unkn Has patient had a PCN reaction that required hospitalization: unkn Has patient had a PCN reaction occurring within the last 10 years: no If all of the above answers are "NO", then may proceed with Cephalosporin use.   . Sildenafil Nausea And Vomiting    Possible cause of GI upset    Family History  Problem Relation Age of Onset  . Hypertension Father   . Lung cancer Mother   . Diabetes Paternal Aunt   . Colon cancer Neg Hx   . Prostate cancer Neg Hx     Prior to Admission medications   Medication Sig Start Date End Date Taking? Authorizing Provider  acetaminophen (TYLENOL) 500 MG tablet Take 1,000 mg by mouth every 6 (six) hours as needed for mild pain.   Yes [provider]  amLODipine (NORVASC) 10 MG  tablet Take 1 tablet (10 mg total) by mouth daily. 11/27/17  Yes Joaquim Namuncan, [name], MD  colchicine 0.6 MG tablet Take 1 tablet (0.6 mg total) by mouth daily as needed. Okay to fill with mitigare or colcrys if cheaper. Patient taking differently: Take 0.6 mg by mouth daily as needed (gout flare ups).  09/19/17  Yes Joaquim Namuncan, [name], MD  diclofenac sodium (VOLTAREN) 1 % GEL Apply 2 g topically daily. 09/19/17  Yes Joaquim Namuncan, [name], MD  lisinopril-hydrochlorothiazide (PRINZIDE,ZESTORETIC) 20-12.5 MG tablet Take 1 tablet by mouth daily. 09/19/17  Yes Joaquim Namuncan, [name], MD  omeprazole (PRILOSEC) 20 MG capsule Take 1 capsule (20 mg total) by mouth 2 (two) times daily before a meal. Patient taking differently: Take 20 mg by mouth daily.  03/14/18  Yes Joaquim Namuncan, [name], MD  simvastatin (ZOCOR) 10 MG tablet Take 1 tablet (10 mg total) by mouth daily at 6 PM. 09/19/17  Yes Joaquim Namuncan, [name], MD  triamcinolone cream (KENALOG) 0.5 % Apply 1 application topically 2 (two) times daily as needed. Patient taking differently: Apply 1 application topically 2 (two) times daily as needed (irritation).  09/19/17  Yes Joaquim Namuncan, [name], MD  ondansetron (ZOFRAN) 4 MG tablet Take 1 tablet (4 mg total) by mouth every 8 (eight) hours as needed for nausea or vomiting. Patient not taking: Reported on 08/02/2018 07/19/18   Joaquim Namuncan, [name], MD    Physical Exam: Vitals:   08/02/18 1915 08/02/18 1930 08/02/18 2030 08/02/18 2053  BP: 94/66 95/64 102/70 112/80  Pulse:  80  72  Resp:  16 20 18   Temp:    98.6 F (37 C)  TempSrc:    Oral  SpO2:  96%  95%  Weight:    92.2 kg  Height:    5\' 8"  (1.727 m)      Constitutional: Moderately built and nourished. Vitals:   08/02/18 1915 08/02/18 1930 08/02/18 2030 08/02/18 2053  BP: 94/66 95/64 102/70 112/80  Pulse:  80  72  Resp:  16 20 18   Temp:    98.6 F (37 C)  TempSrc:    Oral  SpO2:  96%  95%  Weight:    92.2 kg  Height:    5\' 8"  (1.727 m)   Eyes: Mild icterus no  pallor. ENMT: No discharge from the ears eyes nose or mouth. Neck: No mass or.  No neck rigidity.  No JVD appreciated. Respiratory: No rhonchi or crepitations. Cardiovascular: S1-S2 heard no murmurs appreciated. Abdomen: Soft epigastric tenderness present no guarding or rigidity. Musculoskeletal: No edema.  No joint effusion. Skin: No rash. Neurologic: Alert awake oriented to time place and person.  Moves all extremities. Psychiatric: Appears normal per normal affect.   Labs on Admission: I have personally reviewed following labs and imaging studies  CBC: Recent Labs  Lab 07/27/18 0814 08/02/18 1755  WBC 10.4 7.7  NEUTROABS 7.1 4.2  HGB 13.2 14.9  HCT 39.9 45.1  MCV 86.0 84.5  PLT 285 489*  Basic Metabolic Panel: Recent Labs  Lab 07/27/18 0814 08/02/18 1755  NA 138 136  K 3.6 4.9  CL 105 101  CO2 25 22  GLUCOSE 119* 93  BUN 13 18  CREATININE 1.22 3.11*  CALCIUM 8.6* 9.4   GFR: Estimated Creatinine Clearance: 28.9 mL/min (A) (by C-G formula based on SCr of 3.11 mg/dL (H)). Liver Function Tests: Recent Labs  Lab 07/27/18 0814 08/02/18 1755  AST 167* 211*  ALT 354* 432*  ALKPHOS 306* 513*  BILITOT 5.8* 12.7*  PROT 7.0 7.7  ALBUMIN 3.5 3.5   Recent Labs  Lab 08/02/18 1755  LIPASE 67*   No results for input(s): AMMONIA in the last 168 hours. Coagulation Profile: No results for input(s): INR, PROTIME in the last 168 hours. Cardiac Enzymes: No results for input(s): CKTOTAL, CKMB, CKMBINDEX, TROPONINI in the last 168 hours. BNP (last 3 results) No results for input(s): PROBNP in the last 8760 hours. HbA1C: No results for input(s): HGBA1C in the last 72 hours. CBG: Recent Labs  Lab 07/27/18 0759 07/27/18 1149  GLUCAP 104* 120*   Lipid Profile: No results for input(s): CHOL, HDL, LDLCALC, TRIG, CHOLHDL, LDLDIRECT in the last 72 hours. Thyroid Function Tests: No results for input(s): TSH, T4TOTAL, FREET4, T3FREE, THYROIDAB in the last 72  hours. Anemia Panel: No results for input(s): VITAMINB12, FOLATE, FERRITIN, TIBC, IRON, RETICCTPCT in the last 72 hours. Urine analysis:    Component Value Date/Time   COLORURINE AMBER (A) 08/02/2018 2117   APPEARANCEUR HAZY (A) 08/02/2018 2117   LABSPEC 1.010 08/02/2018 2117   PHURINE 5.0 08/02/2018 2117   GLUCOSEU NEGATIVE 08/02/2018 2117   HGBUR NEGATIVE 08/02/2018 2117   BILIRUBINUR MODERATE (A) 08/02/2018 2117   BILIRUBINUR 2+ 07/22/2018 1530   KETONESUR NEGATIVE 08/02/2018 2117   PROTEINUR NEGATIVE 08/02/2018 2117   UROBILINOGEN 1.0 07/22/2018 1530   UROBILINOGEN 0.2 09/25/2014 1300   NITRITE NEGATIVE 08/02/2018 2117   LEUKOCYTESUR NEGATIVE 08/02/2018 2117   Sepsis Labs: @LABRCNTIP (procalcitonin:4,lacticidven:4) ) Recent Results (from the past 240 hour(s))  Surgical pcr screen     Status: None   Collection Time: 07/25/18  9:35 PM  Result Value Ref Range Status   MRSA, PCR NEGATIVE NEGATIVE Final   Staphylococcus aureus NEGATIVE NEGATIVE Final    Comment: (NOTE) The Xpert SA Assay (FDA approved for NASAL specimens in patients 34 years of age and older), is one component of a comprehensive surveillance program. It is not intended to diagnose infection nor to guide or monitor treatment. Performed at [facility], [address], [place], [place] [address]      Radiological Exams on Admission: No results found.    Assessment/Plan Principal Problem:   Obstructive jaundice Active Problems:   Essential hypertension   Epigastric pain   LFT elevation   Acute kidney injury (HCC)    1. Obstructive jaundice with abdominal pain recent CBD stone status post ERCP stent placement and sphincterotomy -concerning for further obstruction.  [facility] has been consulted.  Will keep patient n.p.o. past midnight on empiric antibiotics for now continue with hydration pain medication.  Follow LFTs. 2. Acute renal failure likely from poor oral intake and patient  also has been on lisinopril hydrochlorothiazide.  Lisinopril and hydrochlorothiazide will be held continue with hydration follow UA.  Follow intake output. 3. Hypertension -since patient has low normal blood pressure and also acute renal failure antihypertensives on hold and will keep patient on PRN IV hydralazine. 4. Hyperlipidemia presently n.p.o. 5. History of gout.  DVT prophylaxis: SCDs. Code Status: Full code. Family Communication: Discussed with patient's wife. Disposition Plan: Home. Consults called: Gastroenterology. Admission status: Inpatient.   Eduard Clos MD [facility] Pager [phone].  If 7PM-7AM, please contact night-coverage [email or web address] Password [email or web address]  08/02/2018, 10:16 PM

## 2018-08-02 NOTE — Progress Notes (Signed)
Pharmacy Antibiotic Note  Raymond Willis is a 57 y.o. male admitted on 08/02/2018 with intra abdominal infection.  Pharmacy has been consulted for levaquin dosing.  Plan: Levaquin 500 mg IV q48 hours F/u renal function, cultures and clinical course  Height: 5\' 8"  (172.7 cm) Weight: 203 lb 3.2 oz (92.2 kg)(Scale B) IBW/kg (Calculated) : 68.4  Temp (24hrs), Avg:98.8 F (37.1 C), Min:98.6 F (37 C), Max:98.9 F (37.2 C)  Recent Labs  Lab 07/27/18 0814 08/02/18 1755 08/02/18 1806  WBC 10.4 7.7  --   CREATININE 1.22 3.11*  --   LATICACIDVEN  --   --  1.17    Estimated Creatinine Clearance: 28.9 mL/min (A) (by C-G formula based on SCr of 3.11 mg/dL (H)).    Allergies  Allergen Reactions  . Penicillins Hives    Has patient had a PCN reaction causing immediate rash, facial/tongue/throat swelling, SOB or lightheadedness with hypotension: unkn Has patient had a PCN reaction causing severe rash involving mucus membranes or skin necrosis: unkn Has patient had a PCN reaction that required hospitalization: unkn Has patient had a PCN reaction occurring within the last 10 years: no If all of the above answers are "NO", then may proceed with Cephalosporin use.   . Sildenafil Nausea And Vomiting    Possible cause of GI upset      Thank you for allowing pharmacy to be a part of this patient's care.  Talbert CageSeay, Thu Baggett Poteet 08/02/2018 10:55 PM

## 2018-08-02 NOTE — ED Provider Notes (Signed)
[facility] Provider Note   CSN: [id number] Arrival date & time: 08/02/18  1741     History   Chief Complaint Chief Complaint  Patient presents with  . Hematuria    HPI Raymond Willis is a 57 y.o. male.  HPI  57 year old male presents with dark urine.  Patient states that urine is getting darker like motor oil or dark tea.  Started noticing it 2 days ago and has progressively worsened.  The patient states that about a week ago he had an ERCP for a stent placement due to a blocked common bile duct.  Since then while being at home he will have pain in his abdomen every other day.  Last pain was 2 days ago.  He did not eat yesterday because he was afraid he might cause pain.  Has had a little bit to eat and drink today.  Went to do a drug test at work and they noticed his urine was very dark and took a urinalysis that he has brought with him.  They told him to come to the emergency department.  Wife has noticed that his eyes have become yellow again which was a problem last time that then seemed to resolve for a few days.  This started yesterday.  No vomiting or fevers.  Past Medical History:  Diagnosis Date  . Erectile dysfunction   . Hypercholesteremia   . Hypertension   . Insomnia   . Prediabetes   . Vitamin D deficiency     Patient Active Problem List   Diagnosis Date Noted  . Obstructive jaundice 08/02/2018  . Abdominal pain 07/25/2018  . LFT elevation 07/23/2018  . Epigastric pain 07/21/2018  . Abdominal bloating 03/17/2018  . Joint pain 09/20/2017  . Dry skin 09/20/2017  . GERD (gastroesophageal reflux disease) 03/08/2017  . Routine general medical examination at a health care facility 07/06/2016  . Advance care planning 07/06/2016  . Gout 05/22/2016  . Erectile dysfunction 12/30/2015  . Essential hypertension 12/30/2015  . Hyperlipidemia 12/30/2015    Past Surgical History:  Procedure Laterality Date  . BILIARY STENT  PLACEMENT N/A 07/26/2018   Procedure: BILIARY STENT PLACEMENT;  Surgeon: Willis Modenautlaw, William, MD;  Location: [facility];  Service: Endoscopy;  Laterality: N/A;  . CHOLECYSTECTOMY N/A 06/19/2018   Procedure: LAPAROSCOPIC CHOLECYSTECTOMY;  Surgeon: Abigail MiyamotoBlackman, Douglas, MD;  [facility];  Service: General;  Laterality: N/A;  . COLONOSCOPY  04/2017  . COLONOSCOPY WITH ESOPHAGOGASTRODUODENOSCOPY (EGD)    . ERCP N/A 07/26/2018   Procedure: ENDOSCOPIC RETROGRADE CHOLANGIOPANCREATOGRAPHY (ERCP);  Surgeon: Willis Modenautlaw, William, MD;  [facility];  Service: Endoscopy;  Laterality: N/A;  . IRRIGATION AND DEBRIDEMENT ABSCESS N/A 09/25/2014   Procedure: IRRIGATION AND DEBRIDEMENT ABSCESS;  Surgeon: Heloise PurpuraLester Borden, MD;  [facility];  Service: Urology;  Laterality: N/A;  . SCROTAL EXPLORATION N/A 09/25/2014   Procedure: SCROTUM EXPLORATION;  Surgeon: Heloise PurpuraLester Borden, MD;  [facility];  Service: Urology;  Laterality: N/A;  . SPHINCTEROTOMY  07/26/2018   Procedure: SPHINCTEROTOMY;  Surgeon: Willis Modenautlaw, William, MD;  Location: [facility];  Service: Endoscopy;;  . TONSILLECTOMY          Home Medications    Prior to Admission medications   Medication Sig Start Date End Date Taking? Authorizing Provider  acetaminophen (TYLENOL) 500 MG tablet Take 1,000 mg by mouth every 6 (six) hours as needed for mild pain.   Yes [provider]  amLODipine (NORVASC) 10 MG tablet Take 1 tablet (  10 mg total) by mouth daily. 11/27/17  Yes Joaquim Nam, MD  colchicine 0.6 MG tablet Take 1 tablet (0.6 mg total) by mouth daily as needed. Okay to fill with mitigare or colcrys if cheaper. Patient taking differently: Take 0.6 mg by mouth daily as needed (gout flare ups).  09/19/17  Yes Joaquim Nam, MD  diclofenac sodium (VOLTAREN) 1 % GEL Apply 2 g topically daily. 09/19/17  Yes Joaquim Nam, MD  lisinopril-hydrochlorothiazide (PRINZIDE,ZESTORETIC) 20-12.5 MG tablet Take 1 tablet by mouth daily. 09/19/17   Yes Joaquim Nam, MD  omeprazole (PRILOSEC) 20 MG capsule Take 1 capsule (20 mg total) by mouth 2 (two) times daily before a meal. Patient taking differently: Take 20 mg by mouth daily.  03/14/18  Yes Joaquim Nam, MD  simvastatin (ZOCOR) 10 MG tablet Take 1 tablet (10 mg total) by mouth daily at 6 PM. 09/19/17  Yes Joaquim Nam, MD  triamcinolone cream (KENALOG) 0.5 % Apply 1 application topically 2 (two) times daily as needed. Patient taking differently: Apply 1 application topically 2 (two) times daily as needed (irritation).  09/19/17  Yes Joaquim Nam, MD  ondansetron (ZOFRAN) 4 MG tablet Take 1 tablet (4 mg total) by mouth every 8 (eight) hours as needed for nausea or vomiting. Patient not taking: Reported on 08/02/2018 07/19/18   Joaquim Nam, MD    Family History Family History  Problem Relation Age of Onset  . Hypertension Father   . Lung cancer Mother   . Diabetes Paternal Aunt   . Colon cancer Neg Hx   . Prostate cancer Neg Hx     Social History Social History   Tobacco Use  . Smoking status: Never Smoker  . Smokeless tobacco: Never Used  Substance Use Topics  . Alcohol use: No    Alcohol/week: 0.0 standard drinks  . Drug use: No     Allergies   Penicillins and Sildenafil   Review of Systems Review of Systems  Constitutional: Negative for fever.  Gastrointestinal: Positive for abdominal pain. Negative for vomiting.  Genitourinary: Negative for dysuria and hematuria.  All other systems reviewed and are negative.    Physical Exam Updated Vital Signs BP 95/64   Pulse 80   Temp 98.9 F (37.2 C) (Oral)   Resp 16   Ht 5\' 7"  (1.702 m)   Wt 95.7 kg   SpO2 96%   BMI 33.04 kg/m   Physical Exam  Constitutional: He appears well-developed and well-nourished. No distress.  HENT:  Head: Normocephalic and atraumatic.  Right Ear: External ear normal.  Left Ear: External ear normal.  Nose: Nose normal.  Eyes: Right eye exhibits no discharge.  Left eye exhibits no discharge. Scleral icterus is present.  Neck: Neck supple.  Cardiovascular: Normal rate, regular rhythm and normal heart sounds.  Pulmonary/Chest: Effort normal and breath sounds normal.  Abdominal: Soft. There is no tenderness.  Musculoskeletal: He exhibits no edema.  Neurological: He is alert.  Skin: Skin is warm and dry. He is not diaphoretic.  Psychiatric: His mood appears not anxious.  Nursing note and vitals reviewed.      ED Treatments / Results  Labs (all labs ordered are listed, but only abnormal results are displayed) Labs Reviewed  COMPREHENSIVE METABOLIC PANEL - Abnormal; Notable for the following components:      Result Value   Creatinine, Ser 3.11 (*)    AST 211 (*)    ALT 432 (*)    Alkaline  Phosphatase 513 (*)    Total Bilirubin 12.7 (*)    GFR calc non Af Amer 21 (*)    GFR calc Af Amer 24 (*)    All other components within normal limits  CBC WITH DIFFERENTIAL/PLATELET - Abnormal; Notable for the following components:   Platelets 489 (*)    All other components within normal limits  LIPASE, BLOOD - Abnormal; Notable for the following components:   Lipase 67 (*)    All other components within normal limits  URINALYSIS, ROUTINE W REFLEX MICROSCOPIC  I-STAT CG4 LACTIC ACID, ED    EKG None  Radiology No results found.  Procedures Procedures (including critical care time)  Medications Ordered in ED Medications  0.9 %  sodium chloride infusion (has no administration in time range)  sodium chloride 0.9 % bolus 1,000 mL (0 mLs Intravenous Stopped 08/02/18 1901)  sodium chloride 0.9 % bolus 1,000 mL (1,000 mLs Intravenous New Bag/Given 08/02/18 1915)     Initial Impression / Assessment and Plan / ED Course  I have reviewed the triage vital signs and the nursing notes.  Pertinent labs & imaging results that were available during my care of the patient were reviewed by me and considered in my medical decision making (see chart for  details).     Patient has been borderline hypotensive here.  He was given IV fluids.  I discussed with GI, Dr. Levora Angel, who advises patient can have a liquid diet tonight and then will probably get an ERCP.  No indication for MRCP at this time as we know he still has an likely impacted/retained stone.  Elevated bilirubin could be from worsening biliary disease versus poor clearance from the new renal disease.  Unclear why his creatinine has significantly increased.  He has been having less p.o. intake which may be contributing.  He was given multiple IV fluid boluses and I discussed with Dr. Toniann Fail who will admit.  Final Clinical Impressions(s) / ED Diagnoses   Final diagnoses:  Hyperbilirubinemia  Acute kidney injury [facility])    ED Discharge Orders    None       Pricilla Loveless, MD 08/02/18 2023

## 2018-08-02 NOTE — ED Triage Notes (Signed)
Pt states he went for a drug test today, and when the staff at the facility looked at his urine they told him he needed to be evaluated at the ED. Pt states he had a stent placed x3 days ago, and states his urine has gotten darker each day since the procedure. Pt wife states his eyes are more yellow than usual. Pt endorses intermittent abdominal pain, denies pain at this time.

## 2018-08-03 DIAGNOSIS — R1013 Epigastric pain: Secondary | ICD-10-CM

## 2018-08-03 DIAGNOSIS — R945 Abnormal results of liver function studies: Secondary | ICD-10-CM

## 2018-08-03 DIAGNOSIS — K831 Obstruction of bile duct: Secondary | ICD-10-CM | POA: Diagnosis not present

## 2018-08-03 DIAGNOSIS — I10 Essential (primary) hypertension: Secondary | ICD-10-CM | POA: Diagnosis not present

## 2018-08-03 LAB — HEPATIC FUNCTION PANEL
ALK PHOS: 400 U/L — AB (ref 38–126)
ALT: 327 U/L — ABNORMAL HIGH (ref 0–44)
AST: 147 U/L — ABNORMAL HIGH (ref 15–41)
Albumin: 2.5 g/dL — ABNORMAL LOW (ref 3.5–5.0)
Bilirubin, Direct: 5.6 mg/dL — ABNORMAL HIGH (ref 0.0–0.2)
Indirect Bilirubin: 3 mg/dL — ABNORMAL HIGH (ref 0.3–0.9)
Total Bilirubin: 8.6 mg/dL — ABNORMAL HIGH (ref 0.3–1.2)
Total Protein: 5.9 g/dL — ABNORMAL LOW (ref 6.5–8.1)

## 2018-08-03 LAB — CBC
HCT: 36.4 % — ABNORMAL LOW (ref 39.0–52.0)
Hemoglobin: 11.7 g/dL — ABNORMAL LOW (ref 13.0–17.0)
MCH: 27.1 pg (ref 26.0–34.0)
MCHC: 32.1 g/dL (ref 30.0–36.0)
MCV: 84.3 fL (ref 80.0–100.0)
Platelets: 388 10*3/uL (ref 150–400)
RBC: 4.32 MIL/uL (ref 4.22–5.81)
RDW: 14.5 % (ref 11.5–15.5)
WBC: 5.9 10*3/uL (ref 4.0–10.5)
nRBC: 0 % (ref 0.0–0.2)

## 2018-08-03 LAB — BASIC METABOLIC PANEL
Anion gap: 9 (ref 5–15)
BUN: 16 mg/dL (ref 6–20)
CO2: 25 mmol/L (ref 22–32)
Calcium: 8.2 mg/dL — ABNORMAL LOW (ref 8.9–10.3)
Chloride: 103 mmol/L (ref 98–111)
Creatinine, Ser: 1.97 mg/dL — ABNORMAL HIGH (ref 0.61–1.24)
GFR calc Af Amer: 42 mL/min — ABNORMAL LOW (ref >60)
GFR calc non Af Amer: 37 mL/min — ABNORMAL LOW (ref >60)
Glucose, Bld: 103 mg/dL — ABNORMAL HIGH (ref 70–99)
Potassium: 3.8 mmol/L (ref 3.5–5.1)
Sodium: 137 mmol/L (ref 135–145)

## 2018-08-03 MED ORDER — ACETAMINOPHEN 500 MG PO TABS: 500.0000 mg | ORAL_TABLET | Freq: Four times a day (QID) | ORAL | 0 refills | Status: DC | PRN

## 2018-08-03 NOTE — Progress Notes (Deleted)
Patient has 3 bowel movements (loose) this morning.  Pt is afebrile (temp 98.3 F).  May be r/t drinking Ensure prior to episodes of diarrhea this am.  Per Dr. Jomarie LongsJoseph, pt to receive 1 time dose of Immodium.  C. Diff testing not indicated at this time per Dr. Valentino HueJopseph.

## 2018-08-03 NOTE — Progress Notes (Addendum)
[facility]  Raymond Willis 57 y.o. 03/29/1961  CC:  Obstructive jaundice.   Subjective: This is a 57 year old patient was recently discharged after being treated for obstructive jaundice.he underwent ERCP on 07/26/2018 and was found to have a possible cystic duct stone and outpatient ERCP with spyglass and possible lithotripsy was planned. Meanwhile he started having intermittent epigastric discomfort as well as worsening jaundice and he decided to come to the hospital for further evaluation.  He was found to have bilirubin of 12.7 yesterday which has improved to 8.6 today. His abdominal pain is resolved. Denies nausea or vomiting. Denies any fever. Denies diarrhea or constipation.  ROS :negative for chest pain shortness of breath.   Objective: Vital signs in last 24 hours: Vitals:   08/03/18 0612 08/03/18 0829  BP:  113/77  Pulse:  65  Resp:  16  Temp: 98.2 F (36.8 C) 98.4 F (36.9 C)  SpO2:  94%    Physical Exam:  General:  Alert, cooperative, no distress, appears stated age  Head:  Normocephalic, without obvious abnormality, atraumatic  Eyes:  Scleral icterus noted  Lungs:   Clear to auscultation bilaterally, respirations unlabored  Heart:  Regular rate and rhythm, S1, S2 normal  Abdomen:   Soft, non-tender, nondistended, bowel sounds present. No peritoneal signs  Extremities: Extremities normal, atraumatic, no  edema  Pulses: 2+ and symmetric    Lab Results: Recent Labs    08/02/18 1755 08/03/18 0301  NA 136 137  K 4.9 3.8  CL 101 103  CO2 22 25  GLUCOSE 93 103*  BUN 18 16  CREATININE 3.11* 1.97*  CALCIUM 9.4 8.2*   Recent Labs    08/02/18 1755 08/03/18 0301  AST 211* 147*  ALT 432* 327*  ALKPHOS 513* 400*  BILITOT 12.7* 8.6*  PROT 7.7 5.9*  ALBUMIN 3.5 2.5*   Recent Labs    08/02/18 1755 08/03/18 0301  WBC 7.7 5.9  NEUTROABS 4.2  --   HGB 14.9 11.7*  HCT 45.1 36.4*  MCV 84.5 84.3  PLT 489* 388   No results for  input(s): LABPROT, INR in the last 72 hours.    Assessment/Plan: - obstructive jaundice. Most likely migration of cystic duct stone towards common bile duct - status post ERCP and sphincterotomy and cystic duct stent placement on 07/26/2018  Recommendations ------------------------- - Patient is feeling better now. LFTs are improving. Discussed with Dr. Ewing SchleinMagod.tentative plan for ERCP with spyglass and laser lithotripsy probably on Tuesday or Thursday. - patient does not want to stay in the hospital till  Tuesday. He is afebrile. Abdominal pain is resolved. No leukocytosis. I think it is okay to discharge home with strict return precautions. - Discuss with Dr. Ella JubileeArrien.   Kathi DerParag Rakia Frayne MD, FACP 08/03/2018, 11:26 AM  Contact #  [phone]

## 2018-08-03 NOTE — Progress Notes (Addendum)
Patient discharged.  Vital signs obtained and were stable, AVS reviewed with pt who verbalized understanding.  All questions addressed.  Dr. Levora AngelBrahmbhatt advised patient that ERCP is to [facility] 12[facility].

## 2018-08-03 NOTE — Discharge Summary (Signed)
Physician Discharge Summary  Raymond Willis ZOX:[id number] DOB: 1961/01/15 DOA: 08/02/2018  PCP: Joaquim Nam, MD  Admit date: 08/02/2018 Discharge date: 08/03/2018  Admitted From: Home  Disposition:  Home   Recommendations for Outpatient Follow-up and new medication changes:  1. Follow up with Dr. Para March in 7 days.  2. Follow up with GI for outpatient ERCP.  Home Health: no   Equipment/Devices: no    Discharge Condition: stable CODE STATUS: full  Diet recommendation: low fat diet.   Brief/Interim Summary: 57 year old male who presented with abdominal pain.  He does have the significant past medical history for hypertension, dyslipidemia, gout and prediabetes.  He was recently admitted for a common bile duct stone, underwent ERCP, stent placement and cholecystectomy.  48 hours after his discharge he reported recurrent abdominal pain, epigastric in location, associated with nausea.  On his initial physical examination he was jaundice, blood pressure 94/66, heart rate 80, respiratory rate 16, temperature 98.6, oxygen saturation 95%, mucous membranes, lungs clear to auscultation bilaterally, heart S1-S2 present rhythmic, abdomen soft, tender in the epigastric region, no guarding or rigidity, no lower extremity edema.  Sodium 136, potassium 4.9, chloride 101, bicarb 22, glucose 93, BUN 18, creatinine 3.1, lipase 67, AST 211, ALT 432, total bilirubin is 12.7, white count 7.7, hemoglobin 14.9, hematocrit 45.1, platelets 489, his urine analysis was negative for infection.  Patient was admitted to the hospital with working diagnosis for abdominal pain with cholestatic pattern/ obstructive jaundice  1.  Obstructive jaundice.  Patient was admitted to the medical ward, he was placed on remote telemetry monitor received IV analgesics, IV fluids, IV levofloxacin and as needed IV antiemetics.  His LFTs improved, alkaline phosphatase 400, AST 147, ALT 327, total bilirubin is 8.6.  No further abdominal  pain, nausea or vomiting.  His diet was advanced with good toleration.  Patient was seen by gastroenterology, and scheduled ERCP for December 3.  Patient has remained afebrile, no leukocytosis.  He will be discharged home and follow-up with GI as an outpatient. Patient was advised to follow a low fat diet.   2.  Prerenal acute kidney injury.  Patient received isotonic fluids with improvement of kidney function, creatinine 1.97, potassium 3.8, serum bicarbonate 25.  Follow-up as an outpatient.  3.  Hypertension with hypotension due to hypovolemia.  Clinically improved with IV fluids, patient will resume his antihypertensive agents at home. (amlodipine, lisinopril and hctz)  4.  Dyslipidemia. Continue simvastatin.   5. Gout. Continue allopurinol, no signs of acute exacerbation.   Discharge Diagnoses:  Principal Problem:   Obstructive jaundice Active Problems:   Essential hypertension   Epigastric pain   LFT elevation   Acute kidney injury [facility])    Discharge Instructions   Allergies as of 08/03/2018      Reactions   Penicillins Hives   Has patient had a PCN reaction causing immediate rash, facial/tongue/throat swelling, SOB or lightheadedness with hypotension: unkn Has patient had a PCN reaction causing severe rash involving mucus membranes or skin necrosis: unkn Has patient had a PCN reaction that required hospitalization: unkn Has patient had a PCN reaction occurring within the last 10 years: no If all of the above answers are "NO", then may proceed with Cephalosporin use.   Sildenafil Nausea And Vomiting   Possible cause of GI upset      Medication List    STOP taking these medications   ondansetron 4 MG tablet Commonly known as:  ZOFRAN     TAKE these  medications   acetaminophen 500 MG tablet Commonly known as:  TYLENOL Take 1 tablet (500 mg total) by mouth every 6 (six) hours as needed for mild pain. What changed:  how much to take   amLODipine 10 MG  tablet Commonly known as:  NORVASC Take 1 tablet (10 mg total) by mouth daily.   colchicine 0.6 MG tablet Take 1 tablet (0.6 mg total) by mouth daily as needed. Okay to fill with mitigare or colcrys if cheaper. What changed:    reasons to take this  additional instructions   diclofenac sodium 1 % Gel Commonly known as:  VOLTAREN Apply 2 g topically daily.   lisinopril-hydrochlorothiazide 20-12.5 MG tablet Commonly known as:  PRINZIDE,ZESTORETIC Take 1 tablet by mouth daily.   omeprazole 20 MG capsule Commonly known as:  PRILOSEC Take 1 capsule (20 mg total) by mouth 2 (two) times daily before a meal. What changed:  when to take this   simvastatin 10 MG tablet Commonly known as:  ZOCOR Take 1 tablet (10 mg total) by mouth daily at 6 PM.   triamcinolone cream 0.5 % Commonly known as:  KENALOG Apply 1 application topically 2 (two) times daily as needed. What changed:  reasons to take this       Allergies  Allergen Reactions  . Penicillins Hives    Has patient had a PCN reaction causing immediate rash, facial/tongue/throat swelling, SOB or lightheadedness with hypotension: unkn Has patient had a PCN reaction causing severe rash involving mucus membranes or skin necrosis: unkn Has patient had a PCN reaction that required hospitalization: unkn Has patient had a PCN reaction occurring within the last 10 years: no If all of the above answers are "NO", then may proceed with Cephalosporin use.   . Sildenafil Nausea And Vomiting    Possible cause of GI upset    Consultations:  GI    Procedures/Studies: Mr 3d Recon At Scanner  Addendum Date: 07/25/2018   ADDENDUM REPORT: 07/25/2018 11:12 ADDENDUM: The original report was by Dr. Gaylyn Rong. The following addendum is by Dr. Gaylyn Rong: The impressions from the report were called by technologist Kendal Hymen to Dr.Duncan this morning. Dr. Para March passed on instructions for the patient to proceed to the emergency  room. I had gotten called away to do a procedure and accordingly was not able to reach Dr. Para March myself, but I am available to discuss the case further if needed. Electronically Signed   By: Gaylyn Rong M.D.   On: 07/25/2018 11:12   Result Date: 07/25/2018 CLINICAL DATA:  Cholecystectomy on 06/19/2018.  Acute pancreatitis. EXAM: MRI ABDOMEN WITHOUT AND WITH CONTRAST (INCLUDING MRCP) TECHNIQUE: Multiplanar multisequence MR imaging of the abdomen was performed both before and after the administration of intravenous contrast. Heavily T2-weighted images of the biliary and pancreatic ducts were obtained, and three-dimensional MRCP images were rendered by post processing. CONTRAST:  9 cc Gadavist COMPARISON:  Ultrasound 05/03/2018. FINDINGS: Lower chest: Mild cardiomegaly. Hepatobiliary: Cholecystectomy with 1.8 by 1.3 cm small fluid signal intensity along the gallbladder fossa likely reflecting a small postoperative fluid collection. There is moderate intrahepatic biliary dilatation with a distended cystic duct remnant up to 1.4 cm in diameter, and a common hepatic duct at 1.1 cm in diameter. The common bile duct is likewise dilated up to about 0.8 cm in diameter, extending down to a 10 by 8 mm in diameter filling defect in the CBD on image 68/400 compatible with stone. This is about 1.9 cm proximal to the ampulla  and is believed to be causing biliary obstruction. There is mildly accentuated enhancement along the margins of the bile ducts raising the possibility of cholangitis. Pancreas: Slight irregularity of the dorsal pancreatic duct especially in the pancreatic head may be a manifestation of the patient's recent pancreatitis. No findings of pancreatic necrosis, pseudocyst, or abscess. We do not demonstrate a prominent degree of peripancreatic inflammatory stranding currently. Spleen:  Unremarkable Adrenals/Urinary Tract: Severely atrophic right kidney is likely not contributing much in the way of relative  functional renal mass. Both adrenal glands are unremarkable. Small benign cyst of the left kidney lower pole. Stomach/Bowel: Unremarkable Vascular/Lymphatic: A retrocaval node measures 0.9 cm in short axis on image 73/1104. Additional smaller retroperitoneal lymph nodes are also present. Other:  No supplemental non-categorized findings. Musculoskeletal: Unremarkable IMPRESSION: 1. There is a stone in the common bile duct about 1.9 cm proximal to the ampulla, measuring 10 by 8 mm in diameter. This is causing obstruction, with moderate intrahepatic and extrahepatic biliary dilatation. There is also enhancement of the walls of the biliary tree raising the possibility of mild cholangitis. 2. Slight irregularity of the dorsal pancreatic duct likely a manifestation of the patient's recent pancreatitis. 3. Severely atrophic right kidney. 4. Upper normal size retrocaval lymph node on the right is likely reactive. 5. Mild cardiomegaly. Radiology assistant personnel have been notified to put me in telephone contact with the referring physician or the referring physician's clinical representative in order to discuss these findings. Once this communication is established I will issue an addendum to this report for documentation purposes. Electronically Signed: By: Gaylyn RongWalter  Liebkemann M.D. On: 07/25/2018 09:24   Dg Ercp With Sphincterotomy  Result Date: 07/26/2018 CLINICAL DATA:  Choledocholithiasis EXAM: ERCP TECHNIQUE: Multiple spot images obtained with the fluoroscopic device and submitted for interpretation post-procedure. COMPARISON:  MRCP 07/25/2018 FINDINGS: A series of fluoroscopic spot images document endoscopic cannulation and opacification biliary tree with subsequent passage of a balloon catheter through the CBD. Intrahepatic biliary tree incompletely opacified, appearing mildly distended centrally. Ectatic cystic duct remnant. No definite leak. IMPRESSION: 1. Endoscopic CBD cannulation and intervention. 2.  Ectatic cystic duct stump. These images were submitted for radiologic interpretation only. Please see the procedural report for the amount of contrast and the fluoroscopy time utilized. Electronically Signed   By: Corlis Leak  Hassell M.D.   On: 07/26/2018 10:15   Mr Abdomen Mrcp Vivien RossettiW Wo Contast  Addendum Date: 07/25/2018   ADDENDUM REPORT: 07/25/2018 11:12 ADDENDUM: The original report was by Dr. Gaylyn RongWalter Liebkemann. The following addendum is by Dr. Gaylyn RongWalter Liebkemann: The impressions from the report were called by technologist Kendal HymenBonnie to Dr.Duncan this morning. Dr. Para Marchuncan passed on instructions for the patient to proceed to the emergency room. I had gotten called away to do a procedure and accordingly was not able to reach Dr. Para Marchuncan myself, but I am available to discuss the case further if needed. Electronically Signed   By: Gaylyn RongWalter  Liebkemann M.D.   On: 07/25/2018 11:12   Result Date: 07/25/2018 CLINICAL DATA:  Cholecystectomy on 06/19/2018.  Acute pancreatitis. EXAM: MRI ABDOMEN WITHOUT AND WITH CONTRAST (INCLUDING MRCP) TECHNIQUE: Multiplanar multisequence MR imaging of the abdomen was performed both before and after the administration of intravenous contrast. Heavily T2-weighted images of the biliary and pancreatic ducts were obtained, and three-dimensional MRCP images were rendered by post processing. CONTRAST:  9 cc Gadavist COMPARISON:  Ultrasound 05/03/2018. FINDINGS: Lower chest: Mild cardiomegaly. Hepatobiliary: Cholecystectomy with 1.8 by 1.3 cm small fluid signal intensity along the gallbladder  fossa likely reflecting a small postoperative fluid collection. There is moderate intrahepatic biliary dilatation with a distended cystic duct remnant up to 1.4 cm in diameter, and a common hepatic duct at 1.1 cm in diameter. The common bile duct is likewise dilated up to about 0.8 cm in diameter, extending down to a 10 by 8 mm in diameter filling defect in the CBD on image 68/400 compatible with stone. This is about  1.9 cm proximal to the ampulla and is believed to be causing biliary obstruction. There is mildly accentuated enhancement along the margins of the bile ducts raising the possibility of cholangitis. Pancreas: Slight irregularity of the dorsal pancreatic duct especially in the pancreatic head may be a manifestation of the patient's recent pancreatitis. No findings of pancreatic necrosis, pseudocyst, or abscess. We do not demonstrate a prominent degree of peripancreatic inflammatory stranding currently. Spleen:  Unremarkable Adrenals/Urinary Tract: Severely atrophic right kidney is likely not contributing much in the way of relative functional renal mass. Both adrenal glands are unremarkable. Small benign cyst of the left kidney lower pole. Stomach/Bowel: Unremarkable Vascular/Lymphatic: A retrocaval node measures 0.9 cm in short axis on image 73/1104. Additional smaller retroperitoneal lymph nodes are also present. Other:  No supplemental non-categorized findings. Musculoskeletal: Unremarkable IMPRESSION: 1. There is a stone in the common bile duct about 1.9 cm proximal to the ampulla, measuring 10 by 8 mm in diameter. This is causing obstruction, with moderate intrahepatic and extrahepatic biliary dilatation. There is also enhancement of the walls of the biliary tree raising the possibility of mild cholangitis. 2. Slight irregularity of the dorsal pancreatic duct likely a manifestation of the patient's recent pancreatitis. 3. Severely atrophic right kidney. 4. Upper normal size retrocaval lymph node on the right is likely reactive. 5. Mild cardiomegaly. Radiology assistant personnel have been notified to put me in telephone contact with the referring physician or the referring physician's clinical representative in order to discuss these findings. Once this communication is established I will issue an addendum to this report for documentation purposes. Electronically Signed: By: Gaylyn Rong M.D. On: 07/25/2018  09:24       Subjective: Patient is feeling better, no nausea or vomiting, no further abdominal pain, tolerating po well.   Discharge Exam: Vitals:   08/03/18 1115 08/03/18 1210  BP:  137/87  Pulse: 73 72  Resp: 14 16  Temp:  98.7 F (37.1 C)  SpO2: 95% 97%   Vitals:   08/03/18 0612 08/03/18 0829 08/03/18 1115 08/03/18 1210  BP:  113/77  137/87  Pulse:  65 73 72  Resp:  16 14 16   Temp: 98.2 F (36.8 C) 98.4 F (36.9 C)  98.7 F (37.1 C)  TempSrc: Oral Oral  Oral  SpO2:  94% 95% 97%  Weight:      Height:        General: Not in pain or dyspnea  Neurology: Awake and alert, non focal  E ENT: no pallor, no significant icterus, oral mucosa moist Cardiovascular: No JVD. S1-S2 present, rhythmic, no gallops, rubs, or murmurs. No lower extremity edema. Pulmonary: positive breath sounds bilaterally, adequate air movement, no wheezing, rhonchi or rales. Gastrointestinal. Abdomen with no organomegaly, non tender, no rebound or guarding Skin. No rashes Musculoskeletal: no joint deformities   The results of significant diagnostics from this hospitalization (including imaging, microbiology, ancillary and laboratory) are listed below for reference.     Microbiology: Recent Results (from the past 240 hour(s))  Surgical pcr screen  Status: None   Collection Time: 07/25/18  9:35 PM  Result Value Ref Range Status   MRSA, PCR NEGATIVE NEGATIVE Final   Staphylococcus aureus NEGATIVE NEGATIVE Final    Comment: (NOTE) The Xpert SA Assay (FDA approved for NASAL specimens in patients 32 years of age and older), is one component of a comprehensive surveillance program. It is not intended to diagnose infection nor to guide or monitor treatment. Performed at [facility], [address], [place], [place] [address]      Labs: BNP (last 3 results) No results for input(s): BNP in the last 8760 hours. Basic Metabolic Panel: Recent Labs  Lab 08/02/18 1755  08/03/18 0301  NA 136 137  K 4.9 3.8  CL 101 103  CO2 22 25  GLUCOSE 93 103*  BUN 18 16  CREATININE 3.11* 1.97*  CALCIUM 9.4 8.2*   Liver Function Tests: Recent Labs  Lab 08/02/18 1755 08/03/18 0301  AST 211* 147*  ALT 432* 327*  ALKPHOS 513* 400*  BILITOT 12.7* 8.6*  PROT 7.7 5.9*  ALBUMIN 3.5 2.5*   Recent Labs  Lab 08/02/18 1755  LIPASE 67*   No results for input(s): AMMONIA in the last 168 hours. CBC: Recent Labs  Lab 08/02/18 1755 08/03/18 0301  WBC 7.7 5.9  NEUTROABS 4.2  --   HGB 14.9 11.7*  HCT 45.1 36.4*  MCV 84.5 84.3  PLT 489* 388   Cardiac Enzymes: No results for input(s): CKTOTAL, CKMB, CKMBINDEX, TROPONINI in the last 168 hours. BNP: Invalid input(s): POCBNP CBG: No results for input(s): GLUCAP in the last 168 hours. D-Dimer No results for input(s): DDIMER in the last 72 hours. Hgb A1c No results for input(s): HGBA1C in the last 72 hours. Lipid Profile No results for input(s): CHOL, HDL, LDLCALC, TRIG, CHOLHDL, LDLDIRECT in the last 72 hours. Thyroid function studies No results for input(s): TSH, T4TOTAL, T3FREE, THYROIDAB in the last 72 hours.  Invalid input(s): FREET3 Anemia work up No results for input(s): VITAMINB12, FOLATE, FERRITIN, TIBC, IRON, RETICCTPCT in the last 72 hours. Urinalysis    Component Value Date/Time   COLORURINE AMBER (A) 08/02/2018 2117   APPEARANCEUR HAZY (A) 08/02/2018 2117   LABSPEC 1.010 08/02/2018 2117   PHURINE 5.0 08/02/2018 2117   GLUCOSEU NEGATIVE 08/02/2018 2117   HGBUR NEGATIVE 08/02/2018 2117   BILIRUBINUR MODERATE (A) 08/02/2018 2117   BILIRUBINUR 2+ 07/22/2018 1530   KETONESUR NEGATIVE 08/02/2018 2117   PROTEINUR NEGATIVE 08/02/2018 2117   UROBILINOGEN 1.0 07/22/2018 1530   UROBILINOGEN 0.2 09/25/2014 1300   NITRITE NEGATIVE 08/02/2018 2117   LEUKOCYTESUR NEGATIVE 08/02/2018 2117   Sepsis Labs Invalid input(s): PROCALCITONIN,  WBC,  LACTICIDVEN Microbiology Recent Results (from the past  240 hour(s))  Surgical pcr screen     Status: None   Collection Time: 07/25/18  9:35 PM  Result Value Ref Range Status   MRSA, PCR NEGATIVE NEGATIVE Final   Staphylococcus aureus NEGATIVE NEGATIVE Final    Comment: (NOTE) The Xpert SA Assay (FDA approved for NASAL specimens in patients 61 years of age and older), is one component of a comprehensive surveillance program. It is not intended to diagnose infection nor to guide or monitor treatment. Performed at [facility], [address] [place], [place] [address]      Time coordinating discharge: 45 minutes  SIGNED:   Coralie Keens, MD  [facility] 08/03/2018, 12:44 PM Pager [phone]  If 7PM-7AM, please contact night-coverage [email or web address] Password [email or web address]

## 2018-08-05 ENCOUNTER — Telehealth: Payer: Self-pay

## 2018-08-05 NOTE — Telephone Encounter (Signed)
Patient called back this pm to state that his procedure has been changed to Thursday.  He wants to know if Dr. Para Marchuncan can refer him to [facility] to have the procedure done sooner.  I explained that even if Dr. Para Marchuncan could make the referral, this would likely take him even longer to have the procedure done as their GI would likely need to consult with him first before scheduling.  Patient was not happy about this but stated that he will keep his appointment for Thursday.  He does not feel like that office has his best interest at heart and feels that they have mistreated him.  He does know that if his symptoms worsen to go back to the ER.   Also, he is asking if we can write him a note for being out of work due to his illness.  He had to be out today due to his symptoms and would like to come by and pick up the note tomorrow.  I told him that I would pass this along to Dr. Para Marchuncan and we would call him back in regards to the note.

## 2018-08-05 NOTE — Telephone Encounter (Signed)
I agree that he needs to see GI but I can't move the procedure sooner.  Since he already called to get that appointment moved up, I will await GI input/update from patient.  I'll be glad to see him when reasonable/possible.  If he has worsening symptoms in the meantime, then he should go back to the ER but I certainly hope that isn't the case.   Thanks.

## 2018-08-05 NOTE — Telephone Encounter (Signed)
Transition Care Management Follow-up Telephone Call   Date discharged? 08/03/18   How have you been since you were released from the hospital? Not doing well, GI has cancelled and rescheduled my procedure for Thursday.  I have a call in to them but I need them to take care of me sooner.  I do not want to wait that long.     Do you understand why you were in the hospital? Yes   Do you understand the discharge instructions? Yes   Where were you discharged to? Home   Items Reviewed:  Medications reviewed: Yes  Allergies reviewed: Yes  Dietary changes reviewed: No changes they told him not to worry with his diet.   Referrals reviewed: Should be seeing GI on 12/3 for procedure but they cancelled and rescheduled.   Functional Questionnaire:   Activities of Daily Living (ADLs):   He states they are independent in the following: dressing bathing, grooming, meal prep, eating, toileting, and ambulating.  States they require assistance with the following: does NOT require any assistance in the home    Any transportation issues/concerns?: No  Any patient concerns?  Patient would like for Dr. Para Marchuncan to be aware that his ERCP procedure has been rescheduled for Thursday which he is not happy about and wants it to be moved back up to tomorrow.  He has a call in to Dr. Darius BumpMaggod's Office (GI) to discuss his concerns and his progressing symptoms.  He asks that if Dr. Para Marchuncan can do anything to expedite the process to please do so.  Otherwise, there is nothing further he needs at this point.     Confirmed importance and date/time of follow-up visits scheduled Yes, however patient is mainly interested in seeing GI this week for procedure.  He has cancelled his Hospital follow up with Dr. Para Marchuncan for tomorrow and will call us back later in the week to reschedule after he is seen for his procedure.    Provider Appointment booked with Dr. Para Marchuncan for 12/3 cancelled by patient and he will call back to  reschedule.  As he does not know what he will be doing in terms of procedure this week, he will call us when he knows his schedule.  Confirmed with patient if condition begins to worsen call PCP or go to the ER.  Patient was given the office number and encouraged to call back with question or concerns.  : Yes.

## 2018-08-05 NOTE — Telephone Encounter (Signed)
Dr. Para Marchuncan,  Please note patient concerns in TCM report below.  Patient has cancelled his hospital follow up with you and will call us back once he is through with GI procedure and follow up this week.  Thanks.

## 2018-08-06 ENCOUNTER — Ambulatory Visit: Payer: Managed Care, Other (non HMO) | Admitting: Family Medicine

## 2018-08-06 NOTE — Telephone Encounter (Addendum)
It is likely going to take longer to get in with another GI clinic.  I would have the patient f/u with [facility] and then after that if he needs a second opinion we can put in another referral.  Letter done.  Thanks.

## 2018-08-06 NOTE — Telephone Encounter (Signed)
Noted, patient contacted by [place] to pick up work note.  Thanks.

## 2018-08-08 ENCOUNTER — Other Ambulatory Visit: Payer: Self-pay

## 2018-08-08 ENCOUNTER — Ambulatory Visit (HOSPITAL_COMMUNITY): Payer: Managed Care, Other (non HMO)

## 2018-08-08 ENCOUNTER — Ambulatory Visit (HOSPITAL_COMMUNITY)
Admission: RE | Admit: 2018-08-08 | Discharge: 2018-08-08 | Disposition: A | Discharge status: Home / Self Care | Payer: Managed Care, Other (non HMO) | Source: Ambulatory Visit | Attending: Gastroenterology | Admitting: Gastroenterology

## 2018-08-08 ENCOUNTER — Encounter (HOSPITAL_COMMUNITY)
Admission: RE | Disposition: A | Discharge status: Home / Self Care | Payer: Self-pay | Source: Ambulatory Visit | Attending: Gastroenterology

## 2018-08-08 ENCOUNTER — Encounter (HOSPITAL_COMMUNITY): Payer: Self-pay | Admitting: Emergency Medicine

## 2018-08-08 ENCOUNTER — Ambulatory Visit (HOSPITAL_COMMUNITY): Payer: Managed Care, Other (non HMO) | Admitting: Registered Nurse

## 2018-08-08 DIAGNOSIS — I10 Essential (primary) hypertension: Secondary | ICD-10-CM | POA: Diagnosis not present

## 2018-08-08 DIAGNOSIS — E7800 Pure hypercholesterolemia, unspecified: Secondary | ICD-10-CM | POA: Insufficient documentation

## 2018-08-08 DIAGNOSIS — K831 Obstruction of bile duct: Secondary | ICD-10-CM

## 2018-08-08 DIAGNOSIS — Z79899 Other long term (current) drug therapy: Secondary | ICD-10-CM | POA: Diagnosis not present

## 2018-08-08 DIAGNOSIS — K8050 Calculus of bile duct without cholangitis or cholecystitis without obstruction: Secondary | ICD-10-CM | POA: Insufficient documentation

## 2018-08-08 DIAGNOSIS — K219 Gastro-esophageal reflux disease without esophagitis: Secondary | ICD-10-CM | POA: Insufficient documentation

## 2018-08-08 HISTORY — PX: REMOVAL OF STONES: SHX5545

## 2018-08-08 HISTORY — PX: SPYGLASS LITHOTRIPSY: SHX5537

## 2018-08-08 HISTORY — PX: ERCP: SHX5425

## 2018-08-08 HISTORY — PX: SPYGLASS CHOLANGIOSCOPY: SHX5441

## 2018-08-08 HISTORY — PX: SPHINCTEROTOMY: SHX5544

## 2018-08-08 HISTORY — PX: BILIARY STENT PLACEMENT: SHX5538

## 2018-08-08 SURGERY — ERCP, WITH INTERVENTION IF INDICATED
Anesthesia: General

## 2018-08-08 MED ORDER — PHENYLEPHRINE HCL 10 MG/ML IJ SOLN
INTRAMUSCULAR | Status: DC | PRN
  Administered 2018-08-08: 120 ug via INTRAVENOUS

## 2018-08-08 MED ORDER — INDOMETHACIN 50 MG RE SUPP
RECTAL | Status: AC
  Filled 2018-08-08: qty 1

## 2018-08-08 MED ORDER — LACTATED RINGERS IV SOLN
INTRAVENOUS | Status: DC
  Administered 2018-08-08 (×2): via INTRAVENOUS

## 2018-08-08 MED ORDER — SODIUM CHLORIDE 0.9 % IV SOLN
INTRAVENOUS | Status: DC | PRN
  Administered 2018-08-08: 25 ug/min via INTRAVENOUS

## 2018-08-08 MED ORDER — PROPOFOL 10 MG/ML IV BOLUS
INTRAVENOUS | Status: DC | PRN
  Administered 2018-08-08: 180 mg via INTRAVENOUS

## 2018-08-08 MED ORDER — SODIUM CHLORIDE 0.9 % IV SOLN: INTRAVENOUS | Status: DC

## 2018-08-08 MED ORDER — CIPROFLOXACIN IN D5W 400 MG/200ML IV SOLN
INTRAVENOUS | Status: AC
  Filled 2018-08-08: qty 200

## 2018-08-08 MED ORDER — REMIFENTANIL HCL 1 MG IV SOLR
INTRAVENOUS | Status: AC
  Filled 2018-08-08: qty 1000

## 2018-08-08 MED ORDER — FENTANYL CITRATE (PF) 100 MCG/2ML IJ SOLN
INTRAMUSCULAR | Status: AC
  Filled 2018-08-08: qty 2

## 2018-08-08 MED ORDER — INDOMETHACIN 50 MG RE SUPP
RECTAL | Status: DC | PRN
  Administered 2018-08-08: 100 mg via RECTAL

## 2018-08-08 MED ORDER — LIDOCAINE 2% (20 MG/ML) 5 ML SYRINGE
INTRAMUSCULAR | Status: DC | PRN
  Administered 2018-08-08: 75 mg via INTRAVENOUS
  Administered 2018-08-08: 25 mg via INTRAVENOUS

## 2018-08-08 MED ORDER — PROPOFOL 500 MG/50ML IV EMUL
INTRAVENOUS | Status: DC | PRN
  Administered 2018-08-08: 80 ug/kg/min via INTRAVENOUS

## 2018-08-08 MED ORDER — DEXAMETHASONE SODIUM PHOSPHATE 4 MG/ML IJ SOLN
INTRAMUSCULAR | Status: DC | PRN
  Administered 2018-08-08: 10 mg via INTRAVENOUS

## 2018-08-08 MED ORDER — FENTANYL CITRATE (PF) 100 MCG/2ML IJ SOLN
INTRAMUSCULAR | Status: DC | PRN
  Administered 2018-08-08: 50 ug via INTRAVENOUS

## 2018-08-08 MED ORDER — REMIFENTANIL HCL 2 MG IV SOLR
INTRAVENOUS | Status: DC | PRN
  Administered 2018-08-08: .5 ug/kg/min via INTRAVENOUS

## 2018-08-08 MED ORDER — PROPOFOL 10 MG/ML IV BOLUS
INTRAVENOUS | Status: AC
  Filled 2018-08-08: qty 20

## 2018-08-08 MED ORDER — GLUCAGON HCL RDNA (DIAGNOSTIC) 1 MG IJ SOLR
INTRAMUSCULAR | Status: AC
  Filled 2018-08-08: qty 1

## 2018-08-08 MED ORDER — ROCURONIUM BROMIDE 100 MG/10ML IV SOLN
INTRAVENOUS | Status: DC | PRN
  Administered 2018-08-08: 50 mg via INTRAVENOUS

## 2018-08-08 MED ORDER — SODIUM CHLORIDE 0.9 % IV SOLN
INTRAVENOUS | Status: DC | PRN
  Administered 2018-08-08: 120 mL

## 2018-08-08 MED ORDER — ONDANSETRON HCL 4 MG/2ML IJ SOLN
INTRAMUSCULAR | Status: DC | PRN
  Administered 2018-08-08: 4 mg via INTRAVENOUS

## 2018-08-08 MED ORDER — CIPROFLOXACIN IN D5W 400 MG/200ML IV SOLN
400.0000 mg | Freq: Once | INTRAVENOUS | Status: AC
  Administered 2018-08-08: 400 mg via INTRAVENOUS

## 2018-08-08 MED ORDER — PROPOFOL 10 MG/ML IV BOLUS
INTRAVENOUS | Status: AC
  Filled 2018-08-08: qty 40

## 2018-08-08 NOTE — Anesthesia Preprocedure Evaluation (Addendum)
Anesthesia Evaluation  Patient identified by MRN, date of birth, ID band Patient awake    Reviewed: Allergy & Precautions, NPO status , Patient's Chart, lab work & pertinent test results  History of Anesthesia Complications Negative for: history of anesthetic complications  Airway Mallampati: I  TM Distance: >3 FB Neck ROM: Full    Dental  (+) Missing, Dental Advisory Given   Pulmonary neg pulmonary ROS,    breath sounds clear to auscultation       Cardiovascular hypertension, Pt. on medications (-) angina Rhythm:Regular Rate:Normal     Neuro/Psych negative neurological ROS     GI/Hepatic Neg liver ROS, GERD  Medicated and Controlled,Retained gallstone s/p cholecystectomy, elevated LFTS with obstructive jaundice   Endo/Other  negative endocrine ROS  Renal/GU Renal InsufficiencyRenal disease (creat 1.97)     Musculoskeletal   Abdominal   Peds  Hematology negative hematology ROS (+)   Anesthesia Other Findings   Reproductive/Obstetrics                            Anesthesia Physical Anesthesia Plan  ASA: II  Anesthesia Plan: General   Post-op Pain Management:    Induction: Intravenous  PONV Risk Score and Plan: 2 and Ondansetron and Dexamethasone  Airway Management Planned: Oral ETT  Additional Equipment:   Intra-op Plan:   Post-operative Plan: Extubation in OR  Informed Consent: I have reviewed the patients History and Physical, chart, labs and discussed the procedure including the risks, benefits and alternatives for the proposed anesthesia with the patient or authorized representative who has indicated his/her understanding and acceptance.   Dental advisory given  Plan Discussed with: CRNA and Surgeon  Anesthesia Plan Comments: (Plan routine monitors, GETA)        Anesthesia Quick Evaluation

## 2018-08-08 NOTE — Discharge Instructions (Signed)
Clear liquids only tonight call if significant pain fever nausea vomiting or increased yellow jaundice otherwise we will repeat liver tests in 2 weeks and repeat x-ray to evaluate stent passing and follow-up in the office in 1 month and will need repeat procedure in roughly 2 months for stent removal and hopefully stone extraction   YOU HAD AN ENDOSCOPIC PROCEDURE TODAY: Refer to the procedure report and other information in the discharge instructions given to you for any specific questions about what was found during the examination. If this information does not answer your questions, please call [facility] office at [phone] to clarify.   YOU SHOULD EXPECT: Some feelings of bloating in the abdomen. Passage of more gas than usual. Walking can help get rid of the air that was put into your GI tract during the procedure and reduce the bloating. If you had a lower endoscopy (such as a colonoscopy or flexible sigmoidoscopy) you may notice spotting of blood in your stool or on the toilet paper. Some abdominal soreness may be present for a day or two, also.  DIET: Your first meal following the procedure should be a light meal and then it is ok to progress to your normal diet. A half-sandwich or bowl of soup is an example of a good first meal. Heavy or fried foods are harder to digest and may make you feel nauseous or bloated. Drink plenty of fluids but you should avoid alcoholic beverages for 24 hours. If you had a esophageal dilation, please see attached instructions for diet.   ACTIVITY: Your care partner should take you home directly after the procedure. You should plan to take it easy, moving slowly for the rest of the day. You can resume normal activity the day after the procedure however YOU SHOULD NOT DRIVE, use power tools, machinery or perform tasks that involve climbing or major physical exertion for 24 hours (because of the sedation medicines used during the test).   SYMPTOMS TO REPORT  IMMEDIATELY: A gastroenterologist can be reached at any hour. Please call [phone]  for any of the following symptoms:    Following upper endoscopy (EGD, EUS, ERCP, esophageal dilation) Vomiting of blood or coffee ground material  New, significant abdominal pain  New, significant chest pain or pain under the shoulder blades  Painful or persistently difficult swallowing  New shortness of breath  Black, tarry-looking or red, bloody stools  FOLLOW UP:  If any biopsies were taken you will be contacted by phone or by letter within the next 1-3 weeks. Call [phone]  if you have not heard about the biopsies in 3 weeks.  Please also call with any specific questions about appointments or follow up tests.

## 2018-08-08 NOTE — Transfer of Care (Signed)
Immediate Anesthesia Transfer of Care Note  Patient: Raymond Willis  Procedure(s) Performed: Procedure(s): ENDOSCOPIC RETROGRADE CHOLANGIOPANCREATOGRAPHY (ERCP) (N/A) SPYGLASS CHOLANGIOSCOPY (N/A) SPYGLASS LITHOTRIPSY (N/A)  Patient Location: PACU  Anesthesia Type:General  Level of Consciousness:  sedated, patient cooperative and responds to stimulation  Airway & Oxygen Therapy:Patient Spontanous Breathing and Patient connected to face mask oxgen  Post-op Assessment:  Report given to PACU RN and Post -op Vital signs reviewed and stable  Post vital signs:  Reviewed and stable  Last Vitals:  Vitals:   08/08/18 1143  BP: 126/84  Resp: 17  Temp: 36.9 C  SpO2: 98%    Complications: No apparent anesthesia complications

## 2018-08-08 NOTE — Anesthesia Procedure Notes (Signed)
Procedure Name: Intubation Date/Time: 08/08/2018 12:55 PM Performed by: Lissa Morales, CRNA Pre-anesthesia Checklist: Patient identified, Emergency Drugs available, Suction available and Patient being monitored Patient Re-evaluated:Patient Re-evaluated prior to induction Oxygen Delivery Method: Circle system utilized Preoxygenation: Pre-oxygenation with 100% oxygen Induction Type: IV induction Ventilation: Mask ventilation without difficulty Laryngoscope Size: Mac and 4 Grade View: Grade II Tube type: Oral Tube size: 7.5 mm Number of attempts: 1 Airway Equipment and Method: Stylet and Oral airway Placement Confirmation: ETT inserted through vocal cords under direct vision,  positive ETCO2 and breath sounds checked- equal and bilateral Secured at: 21 cm Tube secured with: Tape Dental Injury: Teeth and Oropharynx as per pre-operative assessment

## 2018-08-08 NOTE — Op Note (Signed)
[facility] Patient Name: Raymond Willis Procedure Date: 08/08/2018 MRN: [id number] Attending MD: Vida Rigger , MD Date of Birth: [birth year] CSN: [id number] Age: 57 Admit Type: Outpatient Procedure:                ERCP Indications:              Bile duct stone versus cystic duct stone Providers:                Vida Rigger, MD, Dwain Sarna, RN, Verita Schneiders,                            Technician, Anastasio Champion, CRNA Referring MD:              Medicines:                General Anesthesia Complications:            No immediate complications. Estimated Blood Loss:     Estimated blood loss was minimal. Procedure:                Pre-Anesthesia Assessment:                           - Prior to the procedure, a History and Physical                            was performed, and patient medications and                            allergies were reviewed. The patient's tolerance of                            previous anesthesia was also reviewed. The risks                            and benefits of the procedure and the sedation                            options and risks were discussed with the patient.                            All questions were answered, and informed consent                            was obtained. Prior Anticoagulants: The patient has                            taken no previous anticoagulant or antiplatelet                            agents. ASA Grade Assessment: II - A patient with                            mild systemic disease. After reviewing the risks  and benefits, the patient was deemed in                            satisfactory condition to undergo the procedure.                           After obtaining informed consent, the scope was                            passed under direct vision. Throughout the                            procedure, the patient's blood pressure, pulse, and                            oxygen  saturations were monitored continuously. The                            [id number]-[id number] ([id number]) Olympus ERCP was introduced                            through the mouth, and used to inject contrast into                            and used to cannulate the bile duct. The ERCP was                            technically difficult and complex due to abnormal                            anatomy. Successful completion of the procedure was                            aided by performing the maneuvers documented                            (below) in this report. The patient tolerated the                            procedure well. Scope In: Scope Out: Findings:      The major papilla was normal. The previously placed stent had migrated       and was seen in the mid abdomen under fluoroscopy initially we       selectively cannulated the cystic duct and dye was injected and we       increased the biliary sphincterotomy was made with a Hydratome       sphincterotome using ERBE electrocautery. There was self limited oozing       from the sphincterotomy which did not require treatment. To discover       objects, the biliary tree was swept with a 9 mm -12 mm adjustable       balloon, and the stone would not pass but seem to be in the CBD and we       then tried the 2 x 4 basket  and was able to partially trap the stone and       using lithotriptor was able to break a small piece off we could not       recapture it on a few more attempts so we switch back to the balloon and       sludge and a little debris was swept from the duct. A few stones were       removed. One stone remained. We could get the 9 mm balloon to pass       fairly readily through the sphincterotomy site but not get the 12 mm       balloon to pass so we proceeded with dilation of the common bile duct       with a 10 mm balloon by 4 cm dilator which was successful. Unfortunately       a few more balloon pull-through's were not successful at  delivering the       stone so we initially increase the sphincterotomy site and proceeded       with a few more balloon pull-through's which were not successful so we       then proceeded with spyglass and the bile duct was explored       endoscopically using the SpyGlass direct visualization system. The       SpyScope was advanced to the bifurcation. Visibility with the scope was       good. The lower third of the main bile duct contained one main stone,       which was medium-sized in diameter and a few smaller stones were seen.       Lithotripsy with the electrohydraulic instrument was successful for       stone fragmentation, but the removal of stone and debris was incomplete.       We used 2 probes both of which broke during lithotripsy and after a       prolonged effort and retrying balloon sweep after the second       lithotripter probe we elected to place one 10 Fr by 5 cm plastic stent       with a single external flap and a single internal flap was placed 4 cm       into the common bile duct. Clear fluid flowed through the stent. The       stent was in good position. The patient tolerated the procedure well       there was no obvious immediate complications Impression:               - The major papilla appeared normal.                           - Choledocholithiasis was found. Partial removal                            was accomplished with biliary sphincterotomy; a                            stent was inserted.                           - A biliary sphincterotomy was performed x2.                           -  The biliary tree was swept multiple times. The                            cystic duct remnant which was dilated was also                            swept multiple times not mentioned above                           - Common bile distal duct and ampullary region was                            successfully dilated.                           - Lithotripsy was successful for  stone                            fragmentation, but the removal of stone and debris                            was incomplete.                           - One plastic stent was placed into the common bile                            duct. Moderate Sedation:      Not Applicable - Patient had care per Anesthesia. Recommendation:           - Avoid aspirin and nonsteroidal anti-inflammatory                            medicines for 1 week.                           - Continue present medications.                           - Return to GI clinic in 1 month. Repeat x-ray in 2                            weeks to confirm previously placed stent passes and                            repeat lab work at that time                           - Telephone GI clinic if symptomatic PRN.                           - Repeat ERCP in 2 months for retreatment either                            here or at a university  of their choice if                            insurance will allow. Procedure Code(s):        --- Professional ---                           [phone], Endoscopic retrograde                            cholangiopancreatography (ERCP); with placement of                            endoscopic stent into biliary or pancreatic duct,                            including pre- and post-dilation and guide wire                            passage, when performed, including sphincterotomy,                            when performed, each stent                           43265, Endoscopic retrograde                            cholangiopancreatography (ERCP); with destruction                            of calculi, any method (eg, mechanical,                            electrohydraulic, lithotripsy)                           43273, Endoscopic cannulation of papilla with                            direct visualization of pancreatic/common bile                            duct(s) (List separately in addition to code(s) for                             primary procedure) Diagnosis Code(s):        --- Professional ---                           K80.50, Calculus of bile duct without cholangitis                            or cholecystitis without obstruction                           K80.20, Calculus of gallbladder without  cholecystitis without obstruction CPT copyright 2018 [employer]. All rights reserved. The codes documented in this report are preliminary and upon coder review may  be revised to meet current compliance requirements. Vida Rigger, MD 08/08/2018 3:47:23 PM This report has been signed electronically. Number of Addenda: 0

## 2018-08-08 NOTE — Anesthesia Postprocedure Evaluation (Signed)
Anesthesia Post Note  Patient: Raymond Willis  Procedure(s) Performed: ENDOSCOPIC RETROGRADE CHOLANGIOPANCREATOGRAPHY (ERCP) (N/A ) SPYGLASS CHOLANGIOSCOPY (N/A ) SPYGLASS LITHOTRIPSY (N/A )     Patient location during evaluation: Endoscopy Anesthesia Type: General Level of consciousness: awake and alert, oriented and patient cooperative Pain management: pain level controlled (c/o mild sore throat) Vital Signs Assessment: post-procedure vital signs reviewed and stable Respiratory status: spontaneous breathing, nonlabored ventilation, respiratory function stable and patient connected to nasal cannula oxygen Cardiovascular status: blood pressure returned to baseline and stable Postop Assessment: no apparent nausea or vomiting Anesthetic complications: no    Last Vitals:  Vitals:   08/08/18 1143 08/08/18 1548  BP: 126/84 136/81  Pulse:  88  Resp: 17 (!) 23  Temp: 36.9 C 37.1 C  SpO2: 98% 94%    Last Pain:  Vitals:   08/08/18 1548  TempSrc: Oral  PainSc: 0-No pain                 Shonica Weier,E. Dickson Kostelnik

## 2018-08-08 NOTE — Progress Notes (Signed)
Cory RoughenJoel L Schwegler 12:33 PM  Subjective: Patient with 2 recent H&P's and his case discussed with my partners Dr. Dulce Sellarutlaw and Dr. B and his case discussed with his wife as well and his hospital computer chart reviewed and he has a little bit of pain but is episodic dark urine bothers him and we reviewed his anatomy on the white board and discussed CBD stones versus gallstones versus cystic duct stones including the various ways to remove them and the success rate and risks of the procedure and we answered all of their questions  Objective: Vital signs stable afebrile no acute distress exam please see previous assessment evaluation previous ERCP labs and MRI reviewed  Assessment: Cystic duct remnant stone  Plan: See above for our discussion of the procedure and okay to proceed today with anesthesia assistance  [facility] CampusMAGOD,Carlise Stofer E  Pager [phone] After 5PM or if no answer call [phone]

## 2018-08-09 ENCOUNTER — Encounter (HOSPITAL_COMMUNITY): Payer: Self-pay | Admitting: Gastroenterology

## 2018-08-09 NOTE — Addendum Note (Signed)
Addendum  created 08/09/18 0736 by Elyn PeersAllen, Telsa Dillavou J, CRNA   Charge Capture section accepted

## 2018-08-15 ENCOUNTER — Encounter (HOSPITAL_COMMUNITY): Payer: Self-pay

## 2018-08-15 ENCOUNTER — Other Ambulatory Visit: Payer: Self-pay | Admitting: Gastroenterology

## 2018-08-15 ENCOUNTER — Ambulatory Visit (HOSPITAL_COMMUNITY): Admit: 2018-08-15 | Payer: Managed Care, Other (non HMO) | Admitting: Gastroenterology

## 2018-08-15 ENCOUNTER — Ambulatory Visit
Admission: RE | Admit: 2018-08-15 | Discharge: 2018-08-15 | Disposition: A | Discharge status: Home / Self Care | Payer: Managed Care, Other (non HMO) | Source: Ambulatory Visit | Attending: Gastroenterology | Admitting: Gastroenterology

## 2018-08-15 DIAGNOSIS — Z9889 Other specified postprocedural states: Secondary | ICD-10-CM

## 2018-08-15 DIAGNOSIS — Z955 Presence of coronary angioplasty implant and graft: Secondary | ICD-10-CM

## 2018-08-15 SURGERY — ERCP, WITH INTERVENTION IF INDICATED
Anesthesia: General

## 2018-08-21 ENCOUNTER — Other Ambulatory Visit: Payer: Self-pay | Admitting: Gastroenterology

## 2018-09-16 ENCOUNTER — Encounter: Payer: Self-pay | Admitting: Family Medicine

## 2018-09-16 ENCOUNTER — Ambulatory Visit: Payer: Managed Care, Other (non HMO) | Admitting: Family Medicine

## 2018-09-16 VITALS — BP 128/76 | HR 76 | Temp 98.5°F | Ht 68.0 in | Wt 204.5 lb

## 2018-09-16 DIAGNOSIS — R7989 Other specified abnormal findings of blood chemistry: Secondary | ICD-10-CM

## 2018-09-16 DIAGNOSIS — R945 Abnormal results of liver function studies: Secondary | ICD-10-CM

## 2018-09-16 DIAGNOSIS — L309 Dermatitis, unspecified: Secondary | ICD-10-CM

## 2018-09-16 MED ORDER — LISINOPRIL-HYDROCHLOROTHIAZIDE 20-12.5 MG PO TABS: 1.0000 | ORAL_TABLET | Freq: Every day | ORAL | 3 refills | Status: DC

## 2018-09-16 MED ORDER — TRIAMCINOLONE ACETONIDE 0.5 % EX CREA: 1.0000 "application " | TOPICAL_CREAM | Freq: Two times a day (BID) | CUTANEOUS | 2 refills | Status: DC | PRN

## 2018-09-16 MED ORDER — AMLODIPINE BESYLATE 10 MG PO TABS: 10.0000 mg | ORAL_TABLET | Freq: Every day | ORAL | 3 refills | Status: DC

## 2018-09-16 NOTE — Patient Instructions (Addendum)
If you have any abdominal pain, fevers, or worsening urinary trouble, then go to the hospital or call the GI clinic [facility]).  We'll contact you with your lab report. We'll update Dr. Ewing Schlein.   Take care.  Glad to see you.

## 2018-09-16 NOTE — Progress Notes (Signed)
He has h/o eczema and had used TAC 0.5% in the past and needed a refill.  We talked about options.  Gallbladder/LFT/bilirubin history noted.  He had cholecystectomy.  He had follow-up bilirubin elevation requiring multiple interventions in the meantime.  We talked about his course with procedures in the meantime.  He has follow-up with GI pending.  He does not have any abdominal pain.  No vomiting.  No diarrhea.  He noticed that his urine was a little bit darker today and he could not tell if his eyes were getting yellower than previous.  Given all of his recent troubles he came in for evaluation about this too. He had been off PPI recently.    Needed refill on BP meds.  Done at OV.    PMH and SH reviewed  ROS: Per HPI unless specifically indicated in ROS section   Meds, vitals, and allergies reviewed.   GEN: nad, alert and oriented HEENT: mucous membranes moist, unclear to me that he has scleral icterus.  It may be that he has his baseline scleral pigmentation.  He does not have yellowing on the underside of the tongue. NECK: supple w/o LA CV: rrr PULM: ctab, no inc wob ABD: soft, +bs, not tender to palpation. EXT: no edema SKIN: Eczema lesion on the R lower shin.   No jaundice.  See after visit summary.

## 2018-09-17 LAB — CBC WITH DIFFERENTIAL/PLATELET
Basophils Absolute: 0.1 10*3/uL (ref 0.0–0.1)
Basophils Relative: 1.3 % (ref 0.0–3.0)
EOS ABS: 0.1 10*3/uL (ref 0.0–0.7)
Eosinophils Relative: 1.5 % (ref 0.0–5.0)
HCT: 40.3 % (ref 39.0–52.0)
Hemoglobin: 13.8 g/dL (ref 13.0–17.0)
Lymphocytes Relative: 45.3 % (ref 12.0–46.0)
Lymphs Abs: 2.9 10*3/uL (ref 0.7–4.0)
MCHC: 34.2 g/dL (ref 30.0–36.0)
MCV: 83.4 fl (ref 78.0–100.0)
MONO ABS: 0.7 10*3/uL (ref 0.1–1.0)
Monocytes Relative: 11.7 % (ref 3.0–12.0)
Neutro Abs: 2.6 10*3/uL (ref 1.4–7.7)
Neutrophils Relative %: 40.2 % — ABNORMAL LOW (ref 43.0–77.0)
Platelets: 306 10*3/uL (ref 150.0–400.0)
RBC: 4.83 Mil/uL (ref 4.22–5.81)
RDW: 14.2 % (ref 11.5–15.5)
WBC: 6.4 10*3/uL (ref 4.0–10.5)

## 2018-09-17 LAB — COMPREHENSIVE METABOLIC PANEL
ALT: 29 U/L (ref 0–53)
AST: 20 U/L (ref 0–37)
Albumin: 4.1 g/dL (ref 3.5–5.2)
Alkaline Phosphatase: 111 U/L (ref 39–117)
BILIRUBIN TOTAL: 1.3 mg/dL — AB (ref 0.2–1.2)
BUN: 19 mg/dL (ref 6–23)
CO2: 27 mEq/L (ref 19–32)
Calcium: 9 mg/dL (ref 8.4–10.5)
Chloride: 102 mEq/L (ref 96–112)
Creatinine, Ser: 1.4 mg/dL (ref 0.40–1.50)
GFR: 66.98 mL/min (ref >60.00)
Glucose, Bld: 98 mg/dL (ref 70–99)
Potassium: 4.1 mEq/L (ref 3.5–5.1)
Sodium: 137 mEq/L (ref 135–145)
Total Protein: 7.1 g/dL (ref 6.0–8.3)

## 2018-09-17 LAB — LIPASE: Lipase: 45 U/L (ref 11.0–59.0)

## 2018-09-18 DIAGNOSIS — L309 Dermatitis, unspecified: Secondary | ICD-10-CM | POA: Insufficient documentation

## 2018-09-18 NOTE — Assessment & Plan Note (Signed)
It is not clear to me that he has scleral icterus at time of exam.  Discussed with patient.  Benign abdominal exam.  He could just have concentration of his urine based on relative decrease in fluid intake.  Check baseline labs today.  He will keep his follow-up with GI.  We will route a copy of his labs to the GI clinic, with appreciation and as FYI.  If he has any worsening abdominal symptoms or any clear jaundice then I want him to go to the emergency room.  He agrees.  See notes on labs. >25 minutes spent in face to face time with patient, >50% spent in counselling or coordination of care.

## 2018-09-18 NOTE — Assessment & Plan Note (Signed)
Okay to use triamcinolone 0.5% cream as needed with routine steroid cautions and update me as needed.  He agrees.

## 2018-10-21 ENCOUNTER — Other Ambulatory Visit: Payer: Self-pay

## 2018-10-21 ENCOUNTER — Encounter (HOSPITAL_COMMUNITY): Payer: Self-pay | Admitting: *Deleted

## 2018-10-21 ENCOUNTER — Other Ambulatory Visit: Payer: Self-pay | Admitting: Gastroenterology

## 2018-10-22 ENCOUNTER — Ambulatory Visit (HOSPITAL_COMMUNITY): Payer: Managed Care, Other (non HMO) | Admitting: Physician Assistant

## 2018-10-22 ENCOUNTER — Encounter (HOSPITAL_COMMUNITY)
Admission: RE | Disposition: A | Discharge status: Home / Self Care | Payer: Self-pay | Source: Home / Self Care | Attending: Gastroenterology

## 2018-10-22 ENCOUNTER — Encounter (HOSPITAL_COMMUNITY): Payer: Self-pay | Admitting: Anesthesiology

## 2018-10-22 ENCOUNTER — Ambulatory Visit (HOSPITAL_COMMUNITY)
Admission: RE | Admit: 2018-10-22 | Discharge: 2018-10-22 | Disposition: A | Discharge status: Home / Self Care | Payer: Managed Care, Other (non HMO) | Source: Ambulatory Visit | Attending: Gastroenterology | Admitting: Gastroenterology

## 2018-10-22 ENCOUNTER — Ambulatory Visit (HOSPITAL_COMMUNITY)
Admission: RE | Admit: 2018-10-22 | Discharge: 2018-10-22 | Disposition: A | Discharge status: Home / Self Care | Payer: Managed Care, Other (non HMO) | Attending: Gastroenterology | Admitting: Gastroenterology

## 2018-10-22 DIAGNOSIS — I10 Essential (primary) hypertension: Secondary | ICD-10-CM | POA: Diagnosis not present

## 2018-10-22 DIAGNOSIS — E785 Hyperlipidemia, unspecified: Secondary | ICD-10-CM | POA: Diagnosis not present

## 2018-10-22 DIAGNOSIS — Z79899 Other long term (current) drug therapy: Secondary | ICD-10-CM | POA: Insufficient documentation

## 2018-10-22 DIAGNOSIS — Z880 Allergy status to penicillin: Secondary | ICD-10-CM | POA: Insufficient documentation

## 2018-10-22 DIAGNOSIS — N529 Male erectile dysfunction, unspecified: Secondary | ICD-10-CM | POA: Insufficient documentation

## 2018-10-22 DIAGNOSIS — K8050 Calculus of bile duct without cholangitis or cholecystitis without obstruction: Secondary | ICD-10-CM | POA: Diagnosis not present

## 2018-10-22 DIAGNOSIS — M109 Gout, unspecified: Secondary | ICD-10-CM | POA: Insufficient documentation

## 2018-10-22 HISTORY — PX: SPYGLASS CHOLANGIOSCOPY: SHX5441

## 2018-10-22 HISTORY — PX: SPYGLASS LITHOTRIPSY: SHX5537

## 2018-10-22 HISTORY — PX: SPHINCTEROTOMY: SHX5544

## 2018-10-22 HISTORY — PX: REMOVAL OF STONES: SHX5545

## 2018-10-22 HISTORY — PX: ERCP: SHX5425

## 2018-10-22 HISTORY — DX: Prediabetes: R73.03

## 2018-10-22 HISTORY — PX: STENT REMOVAL: SHX6421

## 2018-10-22 LAB — POCT I-STAT EG7
Acid-Base Excess: 1 mmol/L (ref 0.0–2.0)
Bicarbonate: 26.8 mmol/L (ref 20.0–28.0)
Calcium, Ion: 1.22 mmol/L (ref 1.15–1.40)
HCT: 40 % (ref 39.0–52.0)
Hemoglobin: 13.6 g/dL (ref 13.0–17.0)
O2 Saturation: 73 %
Potassium: 3.7 mmol/L (ref 3.5–5.1)
Sodium: 141 mmol/L (ref 135–145)
TCO2: 28 mmol/L (ref 22–32)
pCO2, Ven: 47.3 mmHg (ref 44.0–60.0)
pH, Ven: 7.361 (ref 7.250–7.430)
pO2, Ven: 41 mmHg (ref 32.0–45.0)

## 2018-10-22 SURGERY — ERCP, WITH INTERVENTION IF INDICATED
Anesthesia: General

## 2018-10-22 MED ORDER — PROPOFOL 10 MG/ML IV BOLUS
INTRAVENOUS | Status: DC | PRN
  Administered 2018-10-22: 160 mg via INTRAVENOUS

## 2018-10-22 MED ORDER — DEXAMETHASONE SODIUM PHOSPHATE 10 MG/ML IJ SOLN
INTRAMUSCULAR | Status: DC | PRN
  Administered 2018-10-22: 10 mg via INTRAVENOUS

## 2018-10-22 MED ORDER — LIDOCAINE 2% (20 MG/ML) 5 ML SYRINGE
INTRAMUSCULAR | Status: DC | PRN
  Administered 2018-10-22: 80 mg via INTRAVENOUS

## 2018-10-22 MED ORDER — INDOMETHACIN 50 MG RE SUPP
RECTAL | Status: AC
  Filled 2018-10-22: qty 2

## 2018-10-22 MED ORDER — LACTATED RINGERS IV SOLN
INTRAVENOUS | Status: DC | PRN
  Administered 2018-10-22: 10:00:00 via INTRAVENOUS

## 2018-10-22 MED ORDER — SODIUM CHLORIDE 0.9 % IV SOLN: INTRAVENOUS | Status: DC

## 2018-10-22 MED ORDER — SUGAMMADEX SODIUM 500 MG/5ML IV SOLN
INTRAVENOUS | Status: DC | PRN
  Administered 2018-10-22: 400 mg via INTRAVENOUS

## 2018-10-22 MED ORDER — ROCURONIUM BROMIDE 10 MG/ML (PF) SYRINGE
PREFILLED_SYRINGE | INTRAVENOUS | Status: DC | PRN
  Administered 2018-10-22: 100 mg via INTRAVENOUS

## 2018-10-22 MED ORDER — FENTANYL CITRATE (PF) 100 MCG/2ML IJ SOLN
INTRAMUSCULAR | Status: DC | PRN
  Administered 2018-10-22: 100 ug via INTRAVENOUS

## 2018-10-22 MED ORDER — IOPAMIDOL (ISOVUE-300) INJECTION 61%
INTRAVENOUS | Status: DC | PRN
  Administered 2018-10-22: 35 mL

## 2018-10-22 MED ORDER — FENTANYL CITRATE (PF) 100 MCG/2ML IJ SOLN
INTRAMUSCULAR | Status: AC
  Filled 2018-10-22: qty 2

## 2018-10-22 MED ORDER — PROPOFOL 10 MG/ML IV BOLUS
INTRAVENOUS | Status: AC
  Filled 2018-10-22: qty 20

## 2018-10-22 MED ORDER — CIPROFLOXACIN IN D5W 400 MG/200ML IV SOLN
INTRAVENOUS | Status: DC | PRN
  Administered 2018-10-22: 400 mg via INTRAVENOUS

## 2018-10-22 MED ORDER — ONDANSETRON HCL 4 MG/2ML IJ SOLN
INTRAMUSCULAR | Status: DC | PRN
  Administered 2018-10-22: 4 mg via INTRAVENOUS

## 2018-10-22 MED ORDER — PHENYLEPHRINE 40 MCG/ML (10ML) SYRINGE FOR IV PUSH (FOR BLOOD PRESSURE SUPPORT)
PREFILLED_SYRINGE | INTRAVENOUS | Status: DC | PRN
  Administered 2018-10-22 (×2): 80 ug via INTRAVENOUS

## 2018-10-22 MED ORDER — CIPROFLOXACIN IN D5W 400 MG/200ML IV SOLN
INTRAVENOUS | Status: AC
  Filled 2018-10-22: qty 200

## 2018-10-22 MED ORDER — LACTATED RINGERS IV SOLN
INTRAVENOUS | Status: DC
  Administered 2018-10-22: 1000 mL via INTRAVENOUS

## 2018-10-22 MED ORDER — GLUCAGON HCL RDNA (DIAGNOSTIC) 1 MG IJ SOLR
INTRAMUSCULAR | Status: AC
  Filled 2018-10-22: qty 1

## 2018-10-22 NOTE — Transfer of Care (Signed)
Immediate Anesthesia Transfer of Care Note  Patient: Raymond Willis  Procedure(s) Performed: Procedure(s): ENDOSCOPIC RETROGRADE CHOLANGIOPANCREATOGRAPHY (ERCP) (N/A) SPYGLASS LITHOTRIPSY (N/A) SPYGLASS CHOLANGIOSCOPY (N/A)  Patient Location: PACU  Anesthesia Type:General  Level of Consciousness:  sedated, patient cooperative and responds to stimulation  Airway & Oxygen Therapy:Patient Spontanous Breathing and Patient connected to face mask oxgen  Post-op Assessment:  Report given to PACU RN and Post -op Vital signs reviewed and stable  Post vital signs:  Reviewed and stable  Last Vitals: There were no vitals filed for this visit.  Complications: No apparent anesthesia complications

## 2018-10-22 NOTE — Progress Notes (Signed)
Raymond Willis 10:59 AM  Subjective: Patient doing well without any problems since we last saw him in the office and his case discussed with his wife as well  Objective: Vital signs stable afebrile no acute distress exam please see preassessment evaluation labs reviewed  Assessment: Difficult to remove CBD stone  Plan: Okay to proceed with ERCP with anesthesia assistance and hopefully stone removal  [facility] E  Pager [phone] After 5PM or if no answer call [phone]

## 2018-10-22 NOTE — Discharge Instructions (Signed)
Clear liquids only for 6 hours and if doing well at 6 PM may have soft solids and follow-up with me as needed if any residual symptoms call if any question or problem from a GI standpoint.     YOU HAD AN ENDOSCOPIC PROCEDURE TODAY: Refer to the procedure report and other information in the discharge instructions given to you for any specific questions about what was found during the examination. If this information does not answer your questions, please call [facility] office at [phone] to clarify.   YOU SHOULD EXPECT: Some feelings of bloating in the abdomen. Passage of more gas than usual. Walking can help get rid of the air that was put into your GI tract during the procedure and reduce the bloating. If you had a lower endoscopy (such as a colonoscopy or flexible sigmoidoscopy) you may notice spotting of blood in your stool or on the toilet paper. Some abdominal soreness may be present for a day or two, also.  DIET: Your first meal following the procedure should be a light meal and then it is ok to progress to your normal diet. A half-sandwich or bowl of soup is an example of a good first meal. Heavy or fried foods are harder to digest and may make you feel nauseous or bloated. Drink plenty of fluids but you should avoid alcoholic beverages for 24 hours. If you had a esophageal dilation, please see attached instructions for diet.   ACTIVITY: Your care partner should take you home directly after the procedure. You should plan to take it easy, moving slowly for the rest of the day. You can resume normal activity the day after the procedure however YOU SHOULD NOT DRIVE, use power tools, machinery or perform tasks that involve climbing or major physical exertion for 24 hours (because of the sedation medicines used during the test).   SYMPTOMS TO REPORT IMMEDIATELY: A gastroenterologist can be reached at any hour. Please call [phone]  for any of the following symptoms:    Following upper  endoscopy (EGD, EUS, ERCP, esophageal dilation) Vomiting of blood or coffee ground material  New, significant abdominal pain  New, significant chest pain or pain under the shoulder blades  Painful or persistently difficult swallowing  New shortness of breath  Black, tarry-looking or red, bloody stools  FOLLOW UP:  If any biopsies were taken you will be contacted by phone or by letter within the next 1-3 weeks. Call [phone]  if you have not heard about the biopsies in 3 weeks.  Please also call with any specific questions about appointments or follow up tests.

## 2018-10-22 NOTE — Anesthesia Postprocedure Evaluation (Signed)
Anesthesia Post Note  Patient: YUXUAN SPADARO  Procedure(s) Performed: ENDOSCOPIC RETROGRADE CHOLANGIOPANCREATOGRAPHY (ERCP) (N/A ) SPYGLASS LITHOTRIPSY (N/A ) SPYGLASS CHOLANGIOSCOPY (N/A ) SPHINCTEROTOMY REMOVAL OF STONES STENT REMOVAL     Patient location during evaluation: Endoscopy Anesthesia Type: General Level of consciousness: awake Pain management: pain level controlled Vital Signs Assessment: post-procedure vital signs reviewed and stable Respiratory status: spontaneous breathing Cardiovascular status: stable Postop Assessment: no apparent nausea or vomiting Anesthetic complications: no    Last Vitals:  Vitals:   10/22/18 1240 10/22/18 1250  BP: (!) 146/95 (!) 152/100  Pulse: 79 72  Resp: 13 17  Temp:  36.6 C  SpO2: 100% 100%    Last Pain:  Vitals:   10/22/18 1250  TempSrc:   PainSc: 0-No pain                 Stefannie Defeo

## 2018-10-22 NOTE — Op Note (Signed)
[facility] Patient Name: Raymond Willis Procedure Date: 10/22/2018 MRN: [id number] Attending MD: Vida Rigger , MD Date of Birth: 1961/06/05 CSN: [id number] Age: 58 Admit Type: Outpatient Procedure:                ERCP Indications:              For therapy of bile duct stone(s) difficult to                            remove Providers:                Vida Rigger, MD, Roselie Awkward, RN, Harrington Challenger,                            Technician, Greig Right, CRNA Referring MD:              Medicines:                General Anesthesia Complications:            No immediate complications. Estimated Blood Loss:     Estimated blood loss: none. Procedure:                Pre-Anesthesia Assessment:                           - Prior to the procedure, a History and Physical                            was performed, and patient medications and                            allergies were reviewed. The patient's tolerance of                            previous anesthesia was also reviewed. The risks                            and benefits of the procedure and the sedation                            options and risks were discussed with the patient.                            All questions were answered, and informed consent                            was obtained. Prior Anticoagulants: The patient has                            taken no previous anticoagulant or antiplatelet                            agents. ASA Grade Assessment: II - A patient with                            mild  systemic disease. After reviewing the risks                            and benefits, the patient was deemed in                            satisfactory condition to undergo the procedure.                           After obtaining informed consent, the scope was                            passed under direct vision. Throughout the                            procedure, the patient's blood pressure, pulse, and                             oxygen saturations were monitored continuously. The                            [id number]-Q180V ([id number] Olympus duodenoscope was                            introduced through the mouth, and used to inject                            contrast into and used to cannulate the bile duct.                            The ERCP was somewhat difficult due to difficult to                            remove stent. Successful completion of the                            procedure was aided by performing the maneuvers                            documented (below) in this report. The patient                            tolerated the procedure well. Scope In: Scope Out: Findings:      One stent was removed from the common bile duct using a snare. Deep       selective cannulation was readily obtained through the previous       sphincterotomy site and the biliary sphincterotomy was extended with a       Hydratome sphincterotome using ERBE electrocautery. There was no       post-sphincterotomy bleeding. We could easily get the fully bowed       sphincterotome easily in and out of the duct and on initial       cholangiogram choledocholithiasis was found in a slightly dilated duct.       The common  bile duct contained at least one stone but there was some air       in the system as well, which was medium-sized in diameter. The biliary       tree was swept with an 12-15 mm balloon starting at the bifurcation.       Debris was swept from the duct. But no significant stone was delivered       and there was no biliary drainage and we did have difficulty removing       the 12 mm balloon from the patent sphincterotomy site so the bile duct       was explored endoscopically using the SpyGlass direct visualization       system. The SpyScope was advanced to the bifurcation. Visibility with       the scope was good. The lower third of the main bile duct contained one       stone, which was medium-sized in diameter.  Electrohydraulic lithotripsy       was successful using both the low and medium setting. The biliary tree       was swept with a 12 mm balloon starting at the bifurcation. Debris was       swept from the duct. All stones were removed. Nothing was found on       subsequent balloon pull-through's and the balloon passed readily through       the sphincterotomy site out resistance. We then cannulated the cystic       duct with the sphincterotome and swept the cystic duct swept with a 9 mm       balloon starting at the proximal part of the remnant of the cystic duct.       Nothing was found. The balloon passed readily through the cystic duct       and we then swept the CBD 1 more time without abnormality and on final       cholangiogram other than a little air in the system no significant       findings were seen and there was adequate biliary drainage and the wire       balloon and scope was removed and the patient tolerated the procedure       well Impression:               - Choledocholithiasis was found. Complete removal                            was accomplished by biliary sphincterotomy and                            balloon extraction as well as spyglass and                            endoscopic hydraulic lithotripsy.                           - One stent was removed from the common bile duct                            at the beginning of the procedure.                           -  A biliary sphincterotomy was performed.                           - The biliary tree was swept and debris was found.                           - Lithotripsy was successful.                           - The biliary tree was swept and debris and stone                            fragments were removed.                           - The biliary tree and the cystic duct remnant was                            swept and nothing was found at the end of the                            procedure. Moderate Sedation:       Not Applicable - Patient had care per Anesthesia. Recommendation:           - Clear liquid diet for 6 hours.                           - Continue present medications.                           - Return to GI clinic PRN.                           - Telephone GI clinic if symptomatic PRN. Procedure Code(s):        --- Professional ---                           [id number], Endoscopic retrograde                            cholangiopancreatography (ERCP); with destruction                            of calculi, any method (eg, mechanical,                            electrohydraulic, lithotripsy)                           43275, Endoscopic retrograde                            cholangiopancreatography (ERCP); with removal of                            foreign body(s) or stent(s) from biliary/pancreatic  duct(s)                           [id number], Endoscopic retrograde                            cholangiopancreatography (ERCP); with                            sphincterotomy/papillotomy                           43273, Endoscopic cannulation of papilla with                            direct visualization of pancreatic/common bile                            duct(s) (List separately in addition to code(s) for                            primary procedure) Diagnosis Code(s):        --- Professional ---                           K80.50, Calculus of bile duct without cholangitis                            or cholecystitis without obstruction                           Z46.59, Encounter for fitting and adjustment of                            other gastrointestinal appliance and device CPT copyright 2018 [employer]. All rights reserved. The codes documented in this report are preliminary and upon coder review may  be revised to meet current compliance requirements. Vida Rigger, MD 10/22/2018 12:38:04 PM This report has been signed electronically. Number of Addenda:  0

## 2018-10-22 NOTE — Anesthesia Preprocedure Evaluation (Signed)
Anesthesia Evaluation  Patient identified by MRN, date of birth, ID band Patient awake    Reviewed: Allergy & Precautions, Patient's Chart, lab work & pertinent test results  Airway Mallampati: II  TM Distance: >3 FB     Dental   Pulmonary neg pulmonary ROS,    breath sounds clear to auscultation       Cardiovascular hypertension,  Rhythm:Regular Rate:Normal     Neuro/Psych    GI/Hepatic GERD  ,  Endo/Other    Renal/GU Renal disease     Musculoskeletal   Abdominal   Peds  Hematology   Anesthesia Other Findings   Reproductive/Obstetrics                             Anesthesia Physical Anesthesia Plan  ASA: III  Anesthesia Plan: General   Post-op Pain Management:    Induction: Intravenous  PONV Risk Score and Plan: 2 and Ondansetron, Dexamethasone and Midazolam  Airway Management Planned: Oral ETT  Additional Equipment:   Intra-op Plan:   Post-operative Plan: Possible Post-op intubation/ventilation  Informed Consent:     Dental advisory given  Plan Discussed with: CRNA and Anesthesiologist  Anesthesia Plan Comments:         Anesthesia Quick Evaluation

## 2018-10-22 NOTE — Anesthesia Procedure Notes (Signed)
Procedure Name: Intubation Date/Time: 10/22/2018 11:05 AM Performed by: Lavina Hamman, CRNA Pre-anesthesia Checklist: Patient identified, Emergency Drugs available, Suction available, Patient being monitored and Timeout performed Patient Re-evaluated:Patient Re-evaluated prior to induction Oxygen Delivery Method: Circle system utilized Preoxygenation: Pre-oxygenation with 100% oxygen Induction Type: IV induction Ventilation: Mask ventilation without difficulty Laryngoscope Size: Mac and 4 Grade View: Grade I Tube type: Oral Tube size: 7.0 mm Number of attempts: 1 Airway Equipment and Method: Stylet Placement Confirmation: ETT inserted through vocal cords under direct vision,  positive ETCO2,  CO2 detector and breath sounds checked- equal and bilateral Secured at: 23 cm Tube secured with: Tape Dental Injury: Teeth and Oropharynx as per pre-operative assessment

## 2018-10-24 ENCOUNTER — Encounter (HOSPITAL_COMMUNITY): Payer: Self-pay | Admitting: Gastroenterology

## 2018-10-25 ENCOUNTER — Telehealth: Payer: Self-pay | Admitting: *Deleted

## 2018-10-25 MED ORDER — TRIAMCINOLONE ACETONIDE 0.5 % EX CREA: 1.0000 "application " | TOPICAL_CREAM | Freq: Two times a day (BID) | CUTANEOUS | 1 refills | Status: DC | PRN

## 2018-10-25 NOTE — Telephone Encounter (Signed)
TAC rx sent for 120g refill.  Needs f/u here if that isn't helping.  Use prn.  Thanks.

## 2018-10-25 NOTE — Telephone Encounter (Signed)
Patient advised.

## 2018-10-25 NOTE — Telephone Encounter (Signed)
Patient called stating that when he was in last  he was given kenalog cream for his eczema. Patient stated that that cream is working okay for the spots on his leg and arm. Patient stated that the spot on his back is so large that he is having a hard time covering the area with the kenalog cream because the tube is too small. . Patient stated that it may work if he has a larger tube, but not sure. Patient stated that he was using Fluticasone 0.05% and it was a 60 G tube which worked well for him. Patient stated that he is willing to try the kenalog cream on his back if he can get a larger tube or would like a script for the fluticasone that he used previously. Pharmacy- [facility]

## 2018-11-22 ENCOUNTER — Telehealth: Payer: Self-pay | Admitting: *Deleted

## 2018-11-22 NOTE — Telephone Encounter (Signed)
The bilateral distribution makes this much less likely to be infectious.  More likely to be from the braces.  Would avoid tight braces for now.  If any fevers, spreading streaks of redness, or significant new pain, then update [place].  O/w would only observe for now.  Thanks.

## 2018-11-22 NOTE — Telephone Encounter (Signed)
Patient advised.

## 2018-11-22 NOTE — Telephone Encounter (Signed)
Patient called stating he noticed some discoloration below the knee on both legs starting Wednesday. Patient stated that he has worn knee braces for years and at that time he changed the knee braces and put on some that were not as tight. Patient stated that the discoloration is still there, but the left leg looks better than the right leg now. Patent stated that he is not having any pain and there is no swelling or any other symptoms.

## 2019-01-30 ENCOUNTER — Telehealth: Payer: Self-pay | Admitting: Family Medicine

## 2019-01-30 MED ORDER — SIMVASTATIN 10 MG PO TABS: 10.0000 mg | ORAL_TABLET | Freq: Every day | ORAL | 0 refills | Status: DC

## 2019-01-30 MED ORDER — AMLODIPINE BESYLATE 10 MG PO TABS: 10.0000 mg | ORAL_TABLET | Freq: Every day | ORAL | 0 refills | Status: DC

## 2019-01-30 NOTE — Telephone Encounter (Signed)
Best number [phone]

## 2019-01-30 NOTE — Telephone Encounter (Signed)
Pt is out of medication refills because he is past due for CPE with labs. He has been seen several times in the last year for other issues.  I sent in the refill for 90 days. Please schedule OV/CPE with Dr Para March soon. Thanks

## 2019-01-30 NOTE — Telephone Encounter (Signed)
Pt scheduled cpe for 04/07/19 @ 4pm

## 2019-01-30 NOTE — Telephone Encounter (Signed)
Pt called stating he is out of his  Amlodipine  Low on simvastatin   He called drug store they did not have any refills   [facility]

## 2019-03-31 IMAGING — US US ABDOMEN LIMITED
1 series · 14 of 25 positions shown · non-contrast
Comparison: None.

CLINICAL DATA: Right upper quadrant abdominal pain.

EXAM:
ULTRASOUND ABDOMEN LIMITED RIGHT UPPER QUADRANT

[Series 1: us abdomen limited · 0.20mm/px · 14 of 58 slices shown]
[im 1/58]
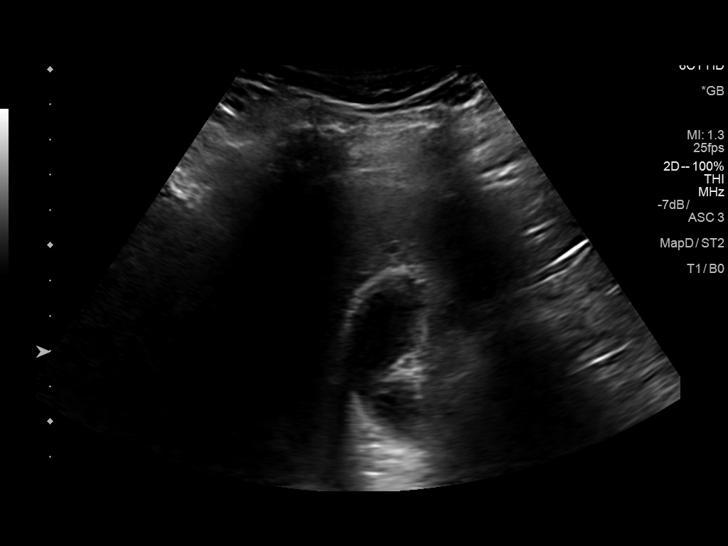
[im 5/58]
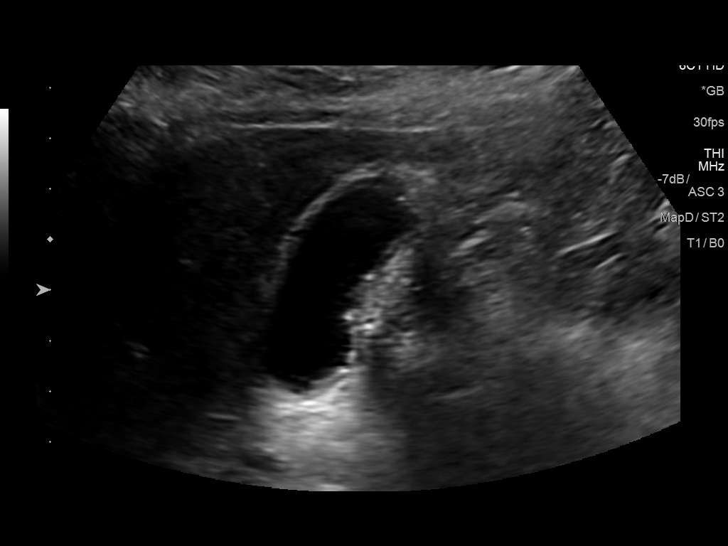
[im 10/58]
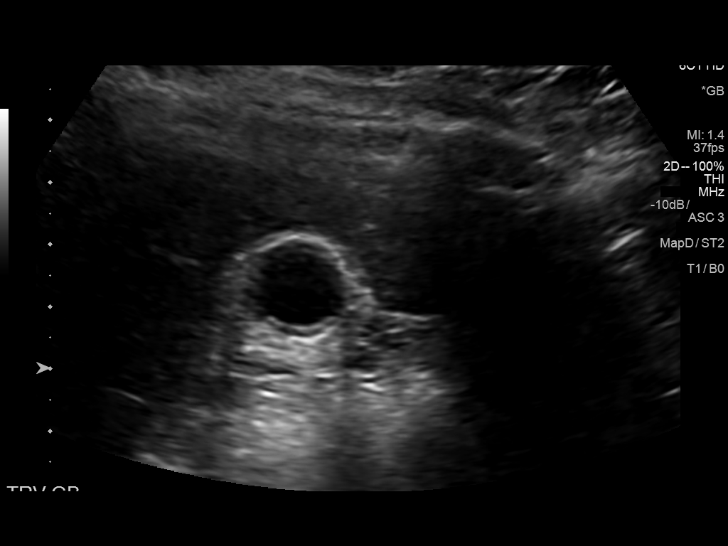
[im 15/58]
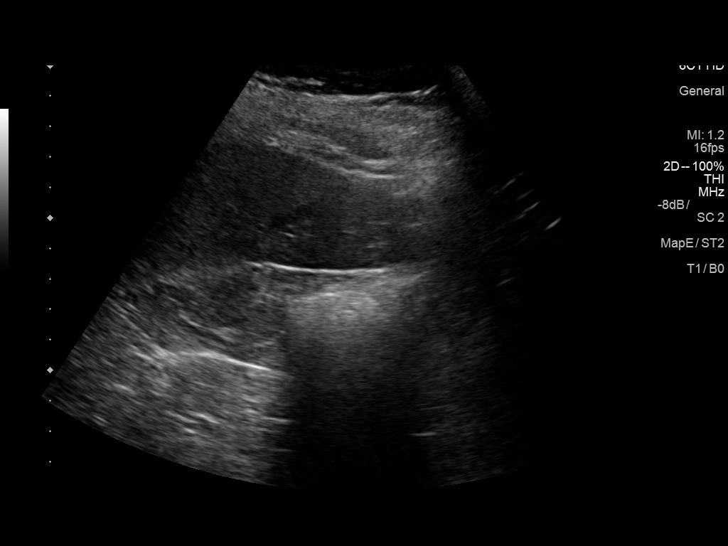
[im 20/58]
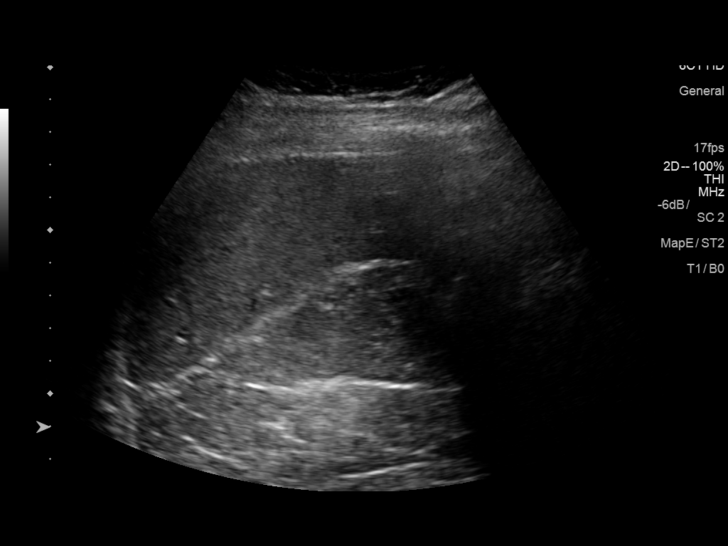
[im 22/58]
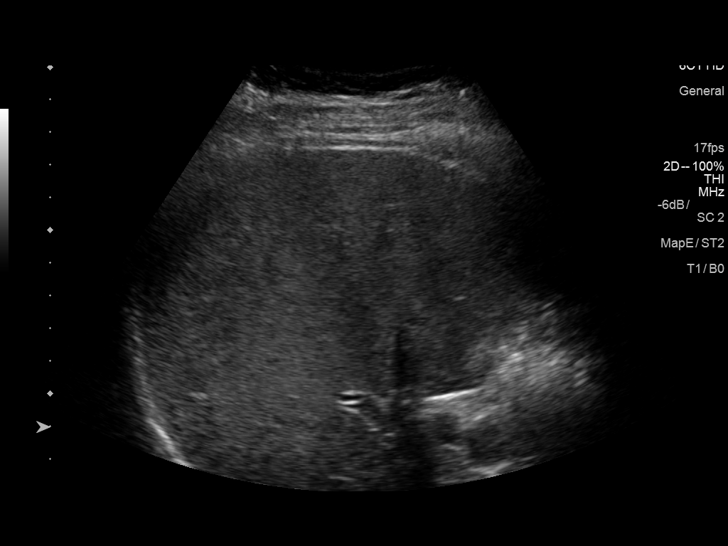
[im 27/58]
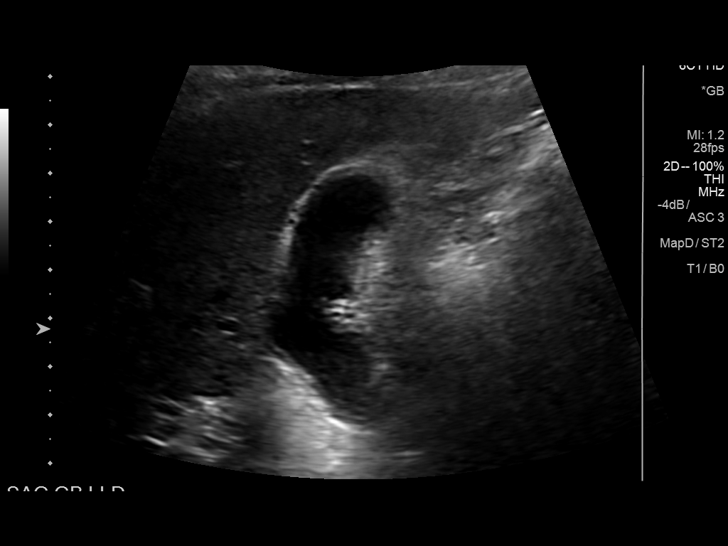
[im 31/58]
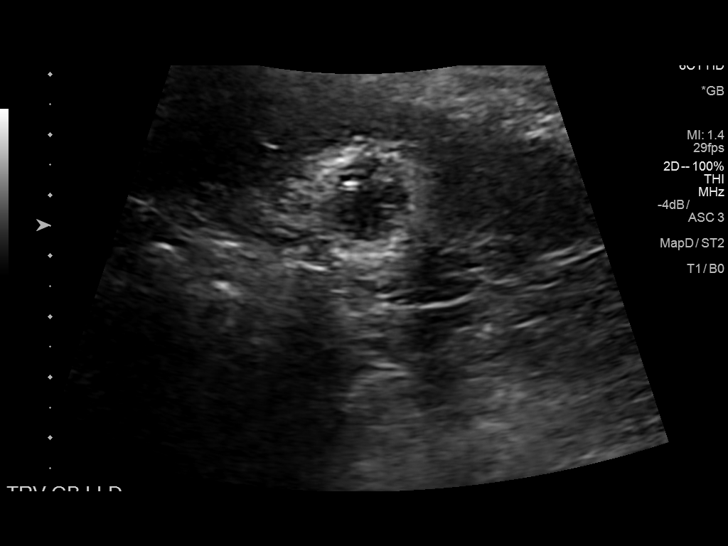
[im 36/58]
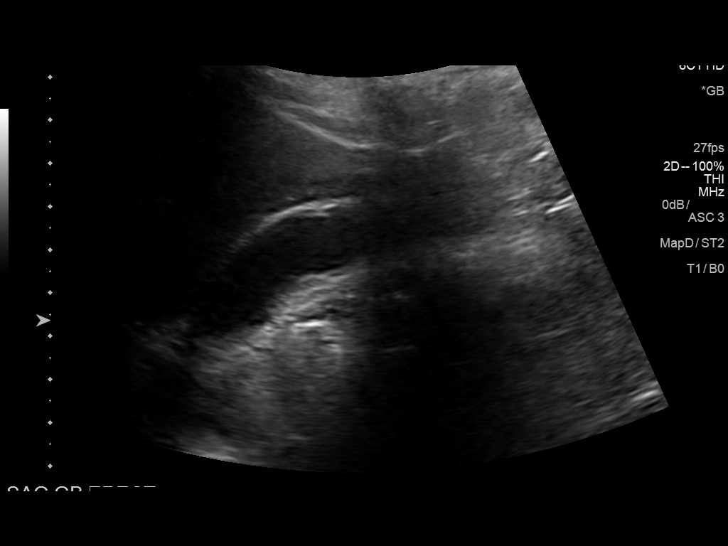
[im 39/58]
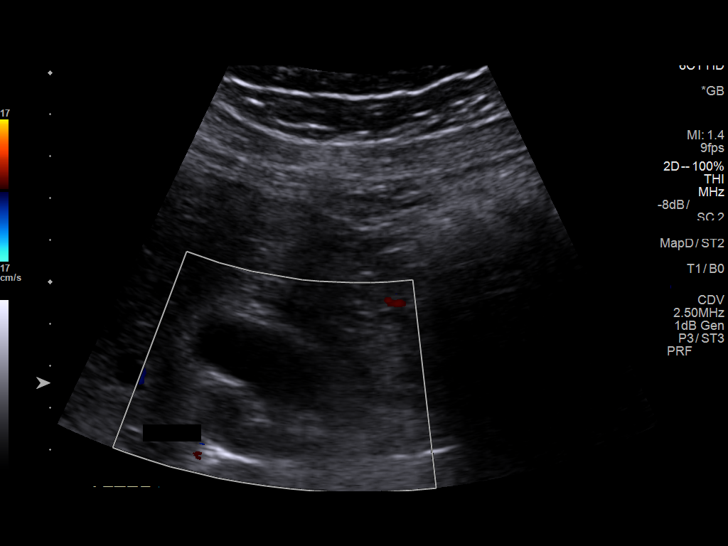
[im 43/58]
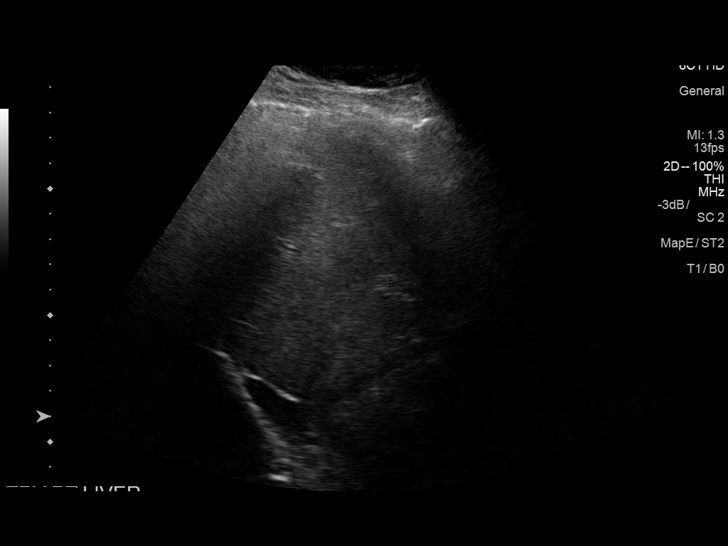
[im 48/58]
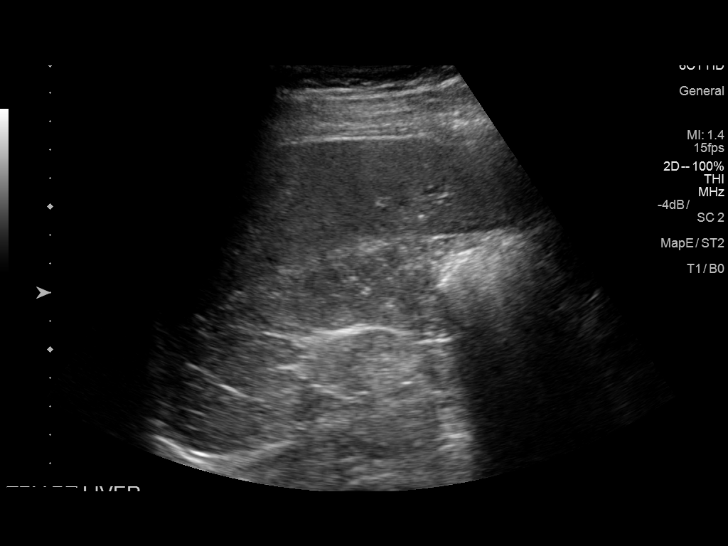
[im 53/58]
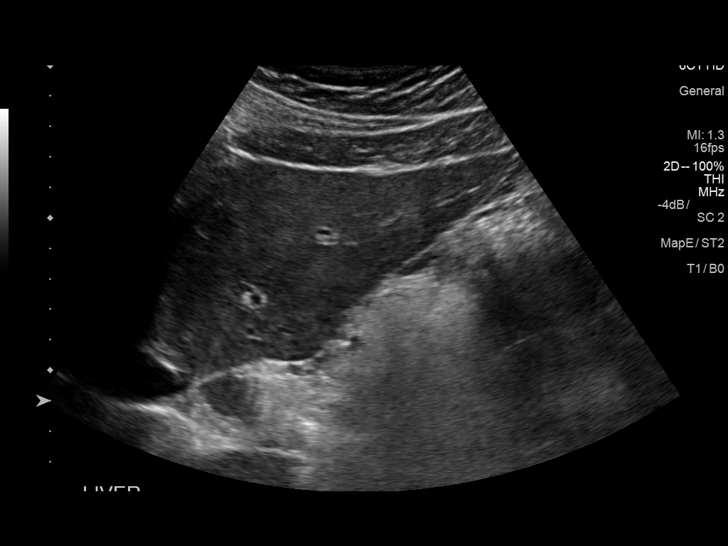
[im 58/58]
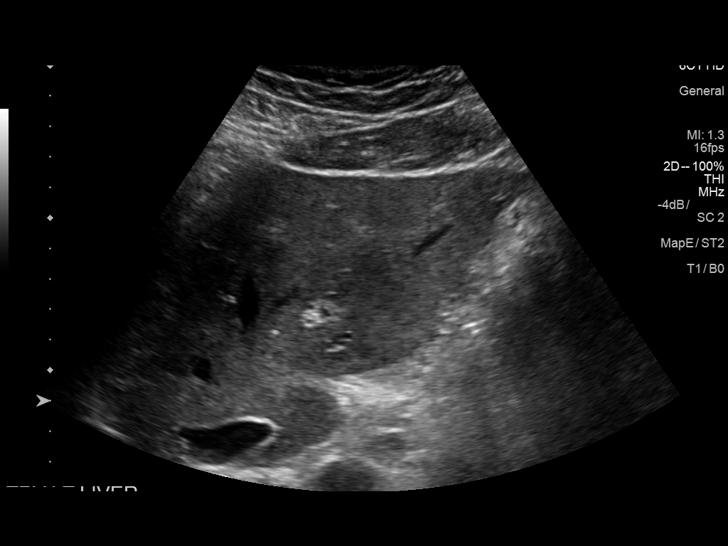

[14 of 25 positions shown; findings below may reference images not displayed]

FINDINGS: Gallbladder:

Shadowing at the neck of the gallbladder likely represents a 4 mm
stone. There is minimal sludge. Gallbladder wall thickness is within
normal limits at 2 mm.

Common bile duct:

Diameter: 7.0 mm, slightly prominent

Liver:

The liver is mildly echogenic. No discrete lesions are present.
Liver contour is smooth. Portal vein is patent on color Doppler
imaging with normal direction of blood flow towards the liver.
IMPRESSION: 1. Probable cholelithiasis without definite cholecystitis.
2. Borderline biliary dilation.
3. Hepatic steatosis.

## 2019-04-02 ENCOUNTER — Other Ambulatory Visit: Payer: Self-pay

## 2019-04-02 DIAGNOSIS — Z20822 Contact with and (suspected) exposure to covid-19: Secondary | ICD-10-CM

## 2019-04-04 LAB — NOVEL CORONAVIRUS, NAA: SARS-CoV-2, NAA: NOT DETECTED

## 2019-04-07 ENCOUNTER — Encounter: Payer: Managed Care, Other (non HMO) | Admitting: Family Medicine

## 2019-04-09 ENCOUNTER — Telehealth: Payer: Self-pay | Admitting: Family Medicine

## 2019-04-09 NOTE — Telephone Encounter (Signed)
Pt returned call for covid results; negative. Pt verbalizes understanding. 

## 2019-04-09 NOTE — Telephone Encounter (Signed)
Noted. Thanks.

## 2019-04-15 ENCOUNTER — Other Ambulatory Visit: Payer: Self-pay

## 2019-04-15 ENCOUNTER — Ambulatory Visit (INDEPENDENT_AMBULATORY_CARE_PROVIDER_SITE_OTHER): Payer: Managed Care, Other (non HMO) | Admitting: Family Medicine

## 2019-04-15 ENCOUNTER — Encounter: Payer: Self-pay | Admitting: Family Medicine

## 2019-04-15 VITALS — BP 138/90 | HR 74 | Temp 98.5°F | Ht 67.25 in | Wt 214.4 lb

## 2019-04-15 DIAGNOSIS — K831 Obstruction of bile duct: Secondary | ICD-10-CM

## 2019-04-15 DIAGNOSIS — L309 Dermatitis, unspecified: Secondary | ICD-10-CM

## 2019-04-15 DIAGNOSIS — M1000 Idiopathic gout, unspecified site: Secondary | ICD-10-CM

## 2019-04-15 DIAGNOSIS — Z0000 Encounter for general adult medical examination without abnormal findings: Secondary | ICD-10-CM | POA: Diagnosis not present

## 2019-04-15 DIAGNOSIS — Z125 Encounter for screening for malignant neoplasm of prostate: Secondary | ICD-10-CM | POA: Diagnosis not present

## 2019-04-15 DIAGNOSIS — I10 Essential (primary) hypertension: Secondary | ICD-10-CM

## 2019-04-15 DIAGNOSIS — E785 Hyperlipidemia, unspecified: Secondary | ICD-10-CM

## 2019-04-15 DIAGNOSIS — Z7189 Other specified counseling: Secondary | ICD-10-CM

## 2019-04-15 DIAGNOSIS — R7989 Other specified abnormal findings of blood chemistry: Secondary | ICD-10-CM

## 2019-04-15 DIAGNOSIS — M2550 Pain in unspecified joint: Secondary | ICD-10-CM

## 2019-04-15 MED ORDER — SIMVASTATIN 10 MG PO TABS: 10.0000 mg | ORAL_TABLET | Freq: Every day | ORAL | 3 refills | Status: DC

## 2019-04-15 MED ORDER — COLCHICINE 0.6 MG PO TABS: 0.6000 mg | ORAL_TABLET | Freq: Every day | ORAL | 0 refills | Status: DC | PRN

## 2019-04-15 MED ORDER — LISINOPRIL-HYDROCHLOROTHIAZIDE 20-12.5 MG PO TABS: 1.0000 | ORAL_TABLET | Freq: Every day | ORAL | 3 refills | Status: DC

## 2019-04-15 MED ORDER — TRIAMCINOLONE ACETONIDE 0.5 % EX CREA: 1.0000 "application " | TOPICAL_CREAM | Freq: Two times a day (BID) | CUTANEOUS | 2 refills | Status: DC | PRN

## 2019-04-15 MED ORDER — AMLODIPINE BESYLATE 10 MG PO TABS: 10.0000 mg | ORAL_TABLET | Freq: Every day | ORAL | 3 refills | Status: DC

## 2019-04-15 NOTE — Progress Notes (Signed)
CPE- See plan.  Routine anticipatory guidance given to patient.  See health maintenance.  The possibility exists that previously documented standard health maintenance information may have been brought forward from a previous encounter into this note.  If needed, that same information has been updated to reflect the current situation based on today's encounter.    Tetanus 2017 Flu encouraged.  PNA not due.  Shingles not due.  PSA wnl.  Colonoscopy done 2018 with [facility].  Living will d/w pt. Wife designated if patient were incapacitated.  Diet and exercise d/w pt.  HIV screening d/w pt. Prev done. HCV neg.   History of gout.  No recent colchicine use or needed.   Elevated Cholesterol: Using medications without problems: yes Muscle aches:  Some occ aches in the AM but better with voltaren gel (knees and elbows).   Diet compliance: yes Exercise:yes, walking.  He has some occ flares of knee pain after more work at home, better in the meantime.     Hypertension:    Using medication without problems or lightheadedness: yes Chest pain with exertion:no Edema:no Short of breath:no Labs pending.   PMH and SH reviewed  Meds, vitals, and allergies reviewed.   ROS: Per HPI.  Unless specifically indicated otherwise in HPI, the patient denies:  General: fever. Eyes: acute vision changes ENT: sore throat Cardiovascular: chest pain Respiratory: SOB GI: vomiting GU: dysuria Musculoskeletal: acute back pain Derm: acute rash Neuro: acute motor dysfunction Psych: worsening mood Endocrine: polydipsia Heme: bleeding Allergy: hayfever  GEN: nad, alert and oriented HEENT: ncat NECK: supple w/o LA CV: rrr. PULM: ctab, no inc wob ABD: soft, +bs EXT: no edema SKIN: no acute rash

## 2019-04-15 NOTE — Patient Instructions (Signed)
I would get a flu shot each fall.   Go to the lab on the way out.  We'll contact you with your lab report. Take care.  Glad to see you.  Update me as needed.

## 2019-04-16 LAB — COMPREHENSIVE METABOLIC PANEL WITH GFR
ALT: 20 U/L (ref 0–53)
AST: 17 U/L (ref 0–37)
Albumin: 4.3 g/dL (ref 3.5–5.2)
Alkaline Phosphatase: 89 U/L (ref 39–117)
BUN: 16 mg/dL (ref 6–23)
CO2: 27 meq/L (ref 19–32)
Calcium: 9.4 mg/dL (ref 8.4–10.5)
Chloride: 104 meq/L (ref 96–112)
Creatinine, Ser: 1.27 mg/dL (ref 0.40–1.50)
GFR: 70.37 mL/min
Glucose, Bld: 90 mg/dL (ref 70–99)
Potassium: 3.7 meq/L (ref 3.5–5.1)
Sodium: 140 meq/L (ref 135–145)
Total Bilirubin: 0.6 mg/dL (ref 0.2–1.2)
Total Protein: 6.8 g/dL (ref 6.0–8.3)

## 2019-04-16 LAB — PSA: PSA: 1.42 ng/mL (ref 0.10–4.00)

## 2019-04-16 LAB — LIPID PANEL
Cholesterol: 180 mg/dL (ref 0–200)
HDL: 47.5 mg/dL
NonHDL: 132.62
Total CHOL/HDL Ratio: 4
Triglycerides: 201 mg/dL — ABNORMAL HIGH (ref 0.0–149.0)
VLDL: 40.2 mg/dL — ABNORMAL HIGH (ref 0.0–40.0)

## 2019-04-16 LAB — LDL CHOLESTEROL, DIRECT: Direct LDL: 112 mg/dL

## 2019-04-16 LAB — URIC ACID: Uric Acid, Serum: 7 mg/dL (ref 4.0–7.8)

## 2019-04-16 NOTE — Assessment & Plan Note (Signed)
He has some occ flares of knee pain after more work at home, better in the meantime.    Update me as needed.  He agrees.

## 2019-04-16 NOTE — Assessment & Plan Note (Signed)
Tetanus 2017 Flu encouraged.  PNA not due.  Shingles not due.  PSA wnl.  Colonoscopy done 2018 with [facility].  Living will d/w pt. Wife designated if patient were incapacitated.  Diet and exercise d/w pt.  HIV screening d/w pt. Prev done. HCV neg.

## 2019-04-16 NOTE — Assessment & Plan Note (Signed)
History of. No jaundice now, resolved.  LFTs normal on previous check.  No abdominal symptoms now.  Previous history discussed with patient.

## 2019-04-16 NOTE — Assessment & Plan Note (Signed)
Can continue topical steroid as needed.  Update me as needed.  He agrees.

## 2019-04-16 NOTE — Assessment & Plan Note (Signed)
See notes on labs.  Recheck labs pending.  Continue work on diet and exercise.  No change in meds at this point.

## 2019-04-16 NOTE — Assessment & Plan Note (Signed)
Reasonable to continue statin.  See notes on labs.  Recheck LFTs pending.  Continue work on diet and exercise.

## 2019-04-16 NOTE — Assessment & Plan Note (Signed)
No jaundice now, resolved.  LFTs normal on previous check.  No abdominal symptoms now.  Previous history discussed with patient.

## 2019-04-16 NOTE — Assessment & Plan Note (Signed)
  History of gout.  No recent colchicine use or needed.  Routine cautions discussed with patient.

## 2019-04-16 NOTE — Assessment & Plan Note (Signed)
Living will d/w pt.  Wife designated if patient were incapacitated.   ?

## 2019-08-23 ENCOUNTER — Ambulatory Visit (HOSPITAL_COMMUNITY)
Admission: EM | Admit: 2019-08-23 | Discharge: 2019-08-23 | Disposition: A | Discharge status: Home / Self Care | Payer: Managed Care, Other (non HMO) | Attending: Emergency Medicine | Admitting: Emergency Medicine

## 2019-08-23 ENCOUNTER — Other Ambulatory Visit: Payer: Self-pay

## 2019-08-23 ENCOUNTER — Encounter (HOSPITAL_COMMUNITY): Payer: Self-pay | Admitting: *Deleted

## 2019-08-23 DIAGNOSIS — Z20822 Contact with and (suspected) exposure to covid-19: Secondary | ICD-10-CM

## 2019-08-23 DIAGNOSIS — R0981 Nasal congestion: Secondary | ICD-10-CM

## 2019-08-23 DIAGNOSIS — R067 Sneezing: Secondary | ICD-10-CM

## 2019-08-23 DIAGNOSIS — Z20828 Contact with and (suspected) exposure to other viral communicable diseases: Secondary | ICD-10-CM | POA: Insufficient documentation

## 2019-08-23 MED ORDER — FLUTICASONE PROPIONATE 50 MCG/ACT NA SUSP: 2.0000 | Freq: Every day | NASAL | 0 refills | Status: DC

## 2019-08-23 NOTE — ED Triage Notes (Signed)
Pt states woke this AM with sneezing and runny nose.  States both have improved throughout the day, but wishes to be tested for Covid.

## 2019-08-23 NOTE — Discharge Instructions (Addendum)
Saline nasal irrigation with a Milta Deiters med rinse and distilled water as often as you want for the nasal congestion.  Try some Claritin, Allegra, or Zyrtec and see if this does not help.  Flonase once a day.  If today's test is negative, consider getting retested here or at [facility] as we discussed in 3 days if you are still having symptoms.

## 2019-08-23 NOTE — ED Provider Notes (Signed)
HPI  SUBJECTIVE:  Raymond Willis is a 58 y.o. male who presents with sneezing starting this morning.  He reports nasal congestion, rhinorrhea.  No itchy, watery eyes.  No fevers, body aches, headaches, sore throat, loss of sense of smell or taste, cough, shortness of breath.  No nausea, vomiting, diarrhea, abdominal pain.  No postnasal drip.  No known exposure to Covid.  He tried Coricidin with improvement in his symptoms.  No aggravating factors.  He has a past medical history of allergies that seem to bother him in the spring and fall, hypertension.  No history of diabetes, coronary disease, chronic kidney disease, cancer, immunocompromise, HIV.  ZOX:WRUEAV, Elveria Rising, MD   Past Medical History:  Diagnosis Date  . Erectile dysfunction   . Hypercholesteremia   . Hypertension   . Insomnia   . Prediabetes   . Vitamin D deficiency     Past Surgical History:  Procedure Laterality Date  . BILIARY STENT PLACEMENT N/A 07/26/2018   Procedure: BILIARY STENT PLACEMENT;  Surgeon: Arta Silence, MD;  Location: [facility];  Service: Endoscopy;  Laterality: N/A;  . BILIARY STENT PLACEMENT N/A 08/08/2018   Procedure: BILIARY STENT PLACEMENT;  Surgeon: Clarene Essex, MD;  Location: [facility];  Service: Endoscopy;  Laterality: N/A;  . CHOLECYSTECTOMY N/A 06/19/2018   Procedure: LAPAROSCOPIC CHOLECYSTECTOMY;  Surgeon: Coralie Keens, MD;  Location: [place];  Service: General;  Laterality: N/A;  . COLONOSCOPY  04/2017  . COLONOSCOPY WITH ESOPHAGOGASTRODUODENOSCOPY (EGD)    . ERCP N/A 07/26/2018   Procedure: ENDOSCOPIC RETROGRADE CHOLANGIOPANCREATOGRAPHY (ERCP);  Surgeon: Arta Silence, MD;  Location: [facility];  Service: Endoscopy;  Laterality: N/A;  . ERCP N/A 08/08/2018   Procedure: ENDOSCOPIC RETROGRADE CHOLANGIOPANCREATOGRAPHY (ERCP);  Surgeon: Clarene Essex, MD;  Location: [facility];  Service: Endoscopy;  Laterality: N/A;  . ERCP N/A 10/22/2018   Procedure: ENDOSCOPIC RETROGRADE  CHOLANGIOPANCREATOGRAPHY (ERCP);  Surgeon: Clarene Essex, MD;  Location: [facility];  Service: Gastroenterology;  Laterality: N/A;  . IRRIGATION AND DEBRIDEMENT ABSCESS N/A 09/25/2014   Procedure: IRRIGATION AND DEBRIDEMENT ABSCESS;  Surgeon: Raynelle Bring, MD;  Location: [place];  Service: Urology;  Laterality: N/A;  . REMOVAL OF STONES  08/08/2018   Procedure: REMOVAL OF STONES;  Surgeon: Clarene Essex, MD;  Location: [facility];  Service: Endoscopy;;  . REMOVAL OF STONES  10/22/2018   Procedure: REMOVAL OF STONES;  Surgeon: Clarene Essex, MD;  Location: [facility];  Service: Gastroenterology;;  . Carolynn Serve EXPLORATION N/A 09/25/2014   Procedure: SCROTUM EXPLORATION;  Surgeon: Raynelle Bring, MD;  Location: [place];  Service: Urology;  Laterality: N/A;  . SPHINCTEROTOMY  07/26/2018   Procedure: SPHINCTEROTOMY;  Surgeon: Arta Silence, MD;  Location: [facility];  Service: Endoscopy;;  . SPHINCTEROTOMY  08/08/2018   Procedure: Joan Mayans;  Surgeon: Clarene Essex, MD;  Location: [facility];  Service: Endoscopy;;  . Joan Mayans  10/22/2018   Procedure: Joan Mayans;  Surgeon: Clarene Essex, MD;  Location: [facility];  Service: Gastroenterology;;  . Bess Kinds CHOLANGIOSCOPY N/A 08/08/2018   Procedure: [id number] CHOLANGIOSCOPY;  Surgeon: Clarene Essex, MD;  Location: [facility];  Service: Endoscopy;  Laterality: N/A;  . SPYGLASS CHOLANGIOSCOPY N/A 10/22/2018   Procedure: [id number] CHOLANGIOSCOPY;  Surgeon: Clarene Essex, MD;  Location: [facility];  Service: Gastroenterology;  Laterality: N/A;  Bess Kinds LITHOTRIPSY N/A 08/08/2018   Procedure: [id number] LITHOTRIPSY;  Surgeon: Clarene Essex, MD;  Location: [facility];  Service: Endoscopy;  Laterality: N/A;  . Bess Kinds LITHOTRIPSY N/A 10/22/2018   Procedure: [id number] LITHOTRIPSY;  Surgeon: Clarene Essex,  MD;  Location: [facility];  Service: Gastroenterology;  Laterality: N/A;  . STENT REMOVAL  10/22/2018   Procedure: STENT REMOVAL;  Surgeon: Vida RiggerMagod, Marc,  MD;  Location: [facility];  Service: Gastroenterology;;  . TONSILLECTOMY      Family History  Problem Relation Age of Onset  . Hypertension Father   . Lung cancer Mother   . Diabetes Paternal Aunt   . Colon cancer Neg Hx   . Prostate cancer Neg Hx     Social History   Tobacco Use  . Smoking status: Never Smoker  . Smokeless tobacco: Never Used  Substance Use Topics  . Alcohol use: Not Currently  . Drug use: No    No current facility-administered medications for this encounter.  Current Outpatient Medications:  .  amLODipine (NORVASC) 10 MG tablet, Take 1 tablet (10 mg total) by mouth daily., Disp: 90 tablet, Rfl: 3 .  diclofenac sodium (VOLTAREN) 1 % GEL, Apply 2 g topically daily., Disp: 300 g, Rfl: 3 .  lisinopril-hydrochlorothiazide (ZESTORETIC) 20-12.5 MG tablet, Take 1 tablet by mouth daily., Disp: 90 tablet, Rfl: 3 .  simvastatin (ZOCOR) 10 MG tablet, Take 1 tablet (10 mg total) by mouth daily at 6 PM., Disp: 90 tablet, Rfl: 3 .  triamcinolone cream (KENALOG) 0.5 %, Apply 1 application topically 2 (two) times daily as needed (IRRITATION)., Disp: 120 g, Rfl: 2 .  acetaminophen (TYLENOL) 500 MG tablet, Take 1 tablet (500 mg total) by mouth every 6 (six) hours as needed for mild pain., Disp: 30 tablet, Rfl: 0 .  colchicine 0.6 MG tablet, Take 1 tablet (0.6 mg total) by mouth daily as needed (for gout flare ups). Okay to fill with mitigare or colcrys if cheaper., Disp: 30 tablet, Rfl: 0 .  fluticasone (FLONASE) 50 MCG/ACT nasal spray, Place 2 sprays into both nostrils daily., Disp: 16 g, Rfl: 0  Allergies  Allergen Reactions  . Penicillins Hives and Other (See Comments)    Did it involve swelling of the face/tongue/throat, SOB, or low BP? Unknown Did it involve sudden or severe rash/hives, skin peeling, or any reaction on the inside of your mouth or nose? Unknown Did you need to seek medical attention at a hospital or doctor's office? Unknown When did it last  happen?Childhood allergy If all above answers are "NO", may proceed with cephalosporin use.   . Sildenafil Nausea And Vomiting and Other (See Comments)    Possible cause of GI upset     ROS  As noted in HPI.   Physical Exam  BP 134/87   Pulse 73   Temp 98.3 F (36.8 C) (Oral)   Resp 18   SpO2 97%   Constitutional: Well developed, well nourished, no acute distress Eyes:  EOMI, conjunctiva normal bilaterally HENT: Normocephalic, atraumatic,mucus membranes moist.  Mild nasal congestion.  Normal turbinates.  No maxillary or frontal sinus tenderness.  Normal oropharynx, tonsils normal.  Uvula midline.  No obvious postnasal drip. Respiratory: Normal inspiratory effort, lungs clear bilaterally Cardiovascular: Normal rate regular rhythm no murmurs rubs or gallops GI: nondistended skin: No rash, skin intact Musculoskeletal: no deformities Neurologic: Alert & oriented x 3, no focal neuro deficits Psychiatric: Speech and behavior appropriate   ED Course   Medications - No data to display  Orders Placed This Encounter  Procedures  . Novel Coronavirus, NAA (Hosp order, Send-out to Ref Lab; TAT 18-24 hrs    Standing Status:   Standing    Number of Occurrences:   1  Order Specific Question:   Is this test for diagnosis or screening    Answer:   Diagnosis of ill patient    Order Specific Question:   Symptomatic for COVID-19 as defined by CDC    Answer:   Yes    Order Specific Question:   Date of Symptom Onset    Answer:   08/23/2019    Order Specific Question:   Hospitalized for COVID-19    Answer:   No    Order Specific Question:   Admitted to ICU for COVID-19    Answer:   No    Order Specific Question:   Previously tested for COVID-19    Answer:   Yes    Order Specific Question:   Resident in a congregate (group) care setting    Answer:   No    Order Specific Question:   Employed in healthcare setting    Answer:   No    No results found for this or any previous  visit (from the past 24 hour(s)). No results found.  ED Clinical Impression  1. Nasal congestion   2. Sneezing   3. Encounter for laboratory testing for COVID-19 virus      ED Assessment/Plan  Covid PCR sent.  Difficult to tell whether this is allergies, Covid or URI since symptoms started this morning.  Advised saline nasal irrigation, Flonase, and antihistamine such as Claritin Zyrtec or Allegra.  Advised retesting if still symptomatic in 3 days and today's test is negative.  Follow-up with PMD as needed.   Meds ordered this encounter  Medications  . fluticasone (FLONASE) 50 MCG/ACT nasal spray    Sig: Place 2 sprays into both nostrils daily.    Dispense:  16 g    Refill:  0    *This clinic note was created using Scientist, clinical (histocompatibility and immunogenetics). Therefore, there may be occasional mistakes despite careful proofreading.     Domenick Gong, MD 08/25/19 [phone]

## 2019-08-24 LAB — NOVEL CORONAVIRUS, NAA (HOSP ORDER, SEND-OUT TO REF LAB; TAT 18-24 HRS): SARS-CoV-2, NAA: NOT DETECTED

## 2019-10-27 ENCOUNTER — Other Ambulatory Visit: Payer: Self-pay | Admitting: Cardiology

## 2019-10-27 DIAGNOSIS — Z20822 Contact with and (suspected) exposure to covid-19: Secondary | ICD-10-CM

## 2019-10-28 LAB — NOVEL CORONAVIRUS, NAA: SARS-CoV-2, NAA: NOT DETECTED

## 2020-01-29 ENCOUNTER — Other Ambulatory Visit: Payer: Self-pay

## 2020-01-29 ENCOUNTER — Encounter: Payer: Self-pay | Admitting: Family Medicine

## 2020-01-29 ENCOUNTER — Ambulatory Visit: Payer: Managed Care, Other (non HMO) | Admitting: Family Medicine

## 2020-01-29 VITALS — BP 110/78 | HR 63 | Temp 97.6°F | Ht 68.0 in | Wt 212.0 lb

## 2020-01-29 DIAGNOSIS — M25569 Pain in unspecified knee: Secondary | ICD-10-CM | POA: Diagnosis not present

## 2020-01-29 DIAGNOSIS — R6889 Other general symptoms and signs: Secondary | ICD-10-CM | POA: Diagnosis not present

## 2020-01-29 LAB — CBC WITH DIFFERENTIAL/PLATELET
Basophils Absolute: 0 10*3/uL (ref 0.0–0.1)
Basophils Relative: 0.5 % (ref 0.0–3.0)
Eosinophils Absolute: 0.2 10*3/uL (ref 0.0–0.7)
Eosinophils Relative: 2.8 % (ref 0.0–5.0)
HCT: 43.3 % (ref 39.0–52.0)
Hemoglobin: 14.7 g/dL (ref 13.0–17.0)
Lymphocytes Relative: 35.9 % (ref 12.0–46.0)
Lymphs Abs: 2.1 10*3/uL (ref 0.7–4.0)
MCHC: 34.1 g/dL (ref 30.0–36.0)
MCV: 84.2 fl (ref 78.0–100.0)
Monocytes Absolute: 0.6 10*3/uL (ref 0.1–1.0)
Monocytes Relative: 10.8 % (ref 3.0–12.0)
Neutro Abs: 2.9 10*3/uL (ref 1.4–7.7)
Neutrophils Relative %: 50 % (ref 43.0–77.0)
Platelets: 269 10*3/uL (ref 150.0–400.0)
RBC: 5.14 Mil/uL (ref 4.22–5.81)
RDW: 14.4 % (ref 11.5–15.5)
WBC: 5.8 10*3/uL (ref 4.0–10.5)

## 2020-01-29 LAB — TSH: TSH: 0.98 u[IU]/mL (ref 0.35–4.50)

## 2020-01-29 MED ORDER — DICLOFENAC SODIUM 1 % EX GEL: 2.0000 g | Freq: Two times a day (BID) | CUTANEOUS | 12 refills | Status: DC | PRN

## 2020-01-29 NOTE — Progress Notes (Signed)
This visit occurred during the SARS-CoV-2 public health emergency.  Safety protocols were in place, including screening questions prior to the visit, additional usage of staff PPE, and extensive cleaning of exam room while observing appropriate contact time as indicated for disinfecting solutions.  He is having some knee aches and stiffness.  On simvastatin.  D/w pt.  He had prev injections per Delbert Harness, but not recently.    He has been cold at night.  Not having shakes.  Only at night.  No FCNAVD.  No day sx.  No one else cold at home.  He is sleeping with more blankets.  He didn't change the air conditioning setting.  No night sweats.  No blood in stool or urine.  No CP SOB BLE edema.    Meds, vitals, and allergies reviewed.   ROS: Per HPI unless specifically indicated in ROS section   GEN: nad, alert and oriented HEENT: ncat NECK: supple w/o LA, no TMG CV: rrr. PULM: ctab, no inc wob ABD: soft, +bs EXT: no edema SKIN: no acute rash L knee crepitus on ROM.

## 2020-01-29 NOTE — Patient Instructions (Addendum)
Stop the simvastatin for 10 days and see if the joint aches get a lot better.   If so, then let me know.  If not, then restart simvastatin.   Ask about follow up with Delbert Harness.  [phone].  Go to the lab on the way out.   If you have mychart we'll likely use that to update you.    Take care.  Glad to see you.

## 2020-02-02 DIAGNOSIS — M25569 Pain in unspecified knee: Secondary | ICD-10-CM | POA: Insufficient documentation

## 2020-02-02 DIAGNOSIS — R6889 Other general symptoms and signs: Secondary | ICD-10-CM | POA: Insufficient documentation

## 2020-02-02 NOTE — Assessment & Plan Note (Signed)
He does not feel unwell otherwise and is still okay for outpatient follow-up.  Reasonable to check routine labs.  See notes on labs.  He agrees.

## 2020-02-02 NOTE — Assessment & Plan Note (Signed)
Discussed options.  I do not think this is related to statin use but we need to make sure Stop the simvastatin for 10 days and see if the joint aches get a lot better.   If so, then let me know.  If not, then restart simvastatin.  He can then ask about follow up with Delbert Harness.  [phone].  Phone number given to patient.

## 2020-04-21 ENCOUNTER — Encounter: Payer: Self-pay | Admitting: Podiatry

## 2020-04-21 ENCOUNTER — Ambulatory Visit: Payer: Managed Care, Other (non HMO)

## 2020-04-21 ENCOUNTER — Ambulatory Visit: Payer: Managed Care, Other (non HMO) | Admitting: Podiatry

## 2020-04-21 ENCOUNTER — Other Ambulatory Visit: Payer: Self-pay

## 2020-04-21 DIAGNOSIS — Q828 Other specified congenital malformations of skin: Secondary | ICD-10-CM

## 2020-04-21 NOTE — Progress Notes (Signed)
Subjective:   Patient ID: Raymond Willis, male   DOB: 59 y.o.   MRN: [id number]   HPI Patient presents stating that I am getting some hard places on the bottom of my right over left foot and its been going on around 6 months.  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  Patient does not smoke likes to be active if possible   Review of Systems  All other systems reviewed and are negative.       Objective:  Physical Exam Vitals and nursing note reviewed.  Constitutional:      Appearance: He is well-developed.  Pulmonary:     Effort: Pulmonary effort is normal.  Musculoskeletal:        General: Normal range of motion.  Skin:    General: Skin is warm.  Neurological:     Mental Status: He is alert.     Neurovascular status intact muscle strength found to be adequate range of motion within normal limits.  Patient is noted to have 3 distinct lesions plantar right 1 out of the metatarsal to 1 to the right lateral heel that do have a lucent core.  Patient is noted to have pain associated with this and mostly around the metatarsal.  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  We will start simple with today sharp debridement occurring with no iatrogenic bleeding and could consider laser or topical medications for orthotics but at this point will try shoe gear modifications.  Patient will be seen back to recheck as needed

## 2020-06-29 ENCOUNTER — Other Ambulatory Visit: Payer: Self-pay | Admitting: Family Medicine

## 2020-10-27 ENCOUNTER — Telehealth: Payer: Self-pay | Admitting: Family Medicine

## 2020-10-27 DIAGNOSIS — I10 Essential (primary) hypertension: Secondary | ICD-10-CM

## 2020-10-27 DIAGNOSIS — M1000 Idiopathic gout, unspecified site: Secondary | ICD-10-CM

## 2020-10-27 DIAGNOSIS — Z1159 Encounter for screening for other viral diseases: Secondary | ICD-10-CM

## 2020-10-27 DIAGNOSIS — Z125 Encounter for screening for malignant neoplasm of prostate: Secondary | ICD-10-CM

## 2020-10-27 NOTE — Telephone Encounter (Signed)
Pt called and wanted to get tested for Hept B due to his wife tested postive for it.

## 2020-10-27 NOTE — Telephone Encounter (Signed)
I put in an order for an acute hepatitis panel, to check for hepatitis A, B, and C.  I would check for all 3.  Please schedule a nonfasting lab appointment.  Thanks.Raymond Willis

## 2020-10-28 ENCOUNTER — Other Ambulatory Visit: Payer: Self-pay | Admitting: Family Medicine

## 2020-10-28 DIAGNOSIS — Z205 Contact with and (suspected) exposure to viral hepatitis: Secondary | ICD-10-CM

## 2020-10-28 NOTE — Telephone Encounter (Signed)
Patient has lab appt on 11/15/20 at 9 am already scheduled; do you want me to move that up sooner?

## 2020-10-28 NOTE — Telephone Encounter (Signed)
Patient appt moved up to 10/29/20 at 9:00 am

## 2020-10-28 NOTE — Telephone Encounter (Signed)
I think it makes sense to move the lab visit sooner.  I put in the labs for his routine physical.  If he comes in fasting, we can get them all done at the same time.  I would keep the office visit/physical as scheduled.  We'll address any sig lab finding in the meantime, prior to the CPE.  Thanks.

## 2020-10-29 ENCOUNTER — Other Ambulatory Visit (INDEPENDENT_AMBULATORY_CARE_PROVIDER_SITE_OTHER): Payer: Managed Care, Other (non HMO)

## 2020-10-29 ENCOUNTER — Other Ambulatory Visit: Payer: Self-pay

## 2020-10-29 DIAGNOSIS — Z125 Encounter for screening for malignant neoplasm of prostate: Secondary | ICD-10-CM | POA: Diagnosis not present

## 2020-10-29 DIAGNOSIS — Z205 Contact with and (suspected) exposure to viral hepatitis: Secondary | ICD-10-CM

## 2020-10-29 DIAGNOSIS — Z113 Encounter for screening for infections with a predominantly sexual mode of transmission: Secondary | ICD-10-CM

## 2020-10-29 DIAGNOSIS — I10 Essential (primary) hypertension: Secondary | ICD-10-CM

## 2020-10-29 DIAGNOSIS — M1000 Idiopathic gout, unspecified site: Secondary | ICD-10-CM

## 2020-10-29 LAB — COMPREHENSIVE METABOLIC PANEL
ALT: 14 U/L (ref 0–53)
AST: 14 U/L (ref 0–37)
Albumin: 4.2 g/dL (ref 3.5–5.2)
Alkaline Phosphatase: 85 U/L (ref 39–117)
BUN: 18 mg/dL (ref 6–23)
CO2: 28 mEq/L (ref 19–32)
Calcium: 9.4 mg/dL (ref 8.4–10.5)
Chloride: 102 mEq/L (ref 96–112)
Creatinine, Ser: 1.21 mg/dL (ref 0.40–1.50)
GFR: 65.33 mL/min (ref >60.00)
Glucose, Bld: 111 mg/dL — ABNORMAL HIGH (ref 70–99)
Potassium: 3.9 mEq/L (ref 3.5–5.1)
Sodium: 138 mEq/L (ref 135–145)
Total Bilirubin: 0.7 mg/dL (ref 0.2–1.2)
Total Protein: 7 g/dL (ref 6.0–8.3)

## 2020-10-29 LAB — LIPID PANEL
Cholesterol: 245 mg/dL — ABNORMAL HIGH (ref 0–200)
HDL: 54 mg/dL (ref >39.00)
LDL Cholesterol: 167 mg/dL — ABNORMAL HIGH (ref 0–99)
NonHDL: 190.85
Total CHOL/HDL Ratio: 5
Triglycerides: 120 mg/dL (ref 0.0–149.0)
VLDL: 24 mg/dL (ref 0.0–40.0)

## 2020-10-29 LAB — PSA: PSA: 1.78 ng/mL (ref 0.10–4.00)

## 2020-10-29 LAB — URIC ACID: Uric Acid, Serum: 7.1 mg/dL (ref 4.0–7.8)

## 2020-11-01 LAB — HEPATITIS PANEL, ACUTE
Hep A IgM: NONREACTIVE
Hep B C IgM: NONREACTIVE
Hepatitis B Surface Ag: NONREACTIVE
Hepatitis C Ab: NONREACTIVE
SIGNAL TO CUT-OFF: 0.01 (ref <1.00)

## 2020-11-01 LAB — HIV ANTIBODY (ROUTINE TESTING W REFLEX): HIV 1&2 Ab, 4th Generation: NONREACTIVE

## 2020-11-01 LAB — RPR: RPR Ser Ql: NONREACTIVE

## 2020-11-11 ENCOUNTER — Ambulatory Visit: Payer: Managed Care, Other (non HMO) | Admitting: Podiatry

## 2020-11-11 ENCOUNTER — Other Ambulatory Visit: Payer: Self-pay

## 2020-11-11 ENCOUNTER — Encounter: Payer: Self-pay | Admitting: Podiatry

## 2020-11-11 DIAGNOSIS — B070 Plantar wart: Secondary | ICD-10-CM | POA: Diagnosis not present

## 2020-11-11 NOTE — Progress Notes (Signed)
Subjective:   Patient ID: Raymond Willis, male   DOB: 60 y.o.   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  Hopefully this will solve the problem encouraged to call with questions and explained what to do if any blistering were to occur

## 2020-11-15 ENCOUNTER — Other Ambulatory Visit: Payer: Managed Care, Other (non HMO)

## 2020-11-26 ENCOUNTER — Ambulatory Visit (INDEPENDENT_AMBULATORY_CARE_PROVIDER_SITE_OTHER): Payer: Managed Care, Other (non HMO) | Admitting: Family Medicine

## 2020-11-26 ENCOUNTER — Other Ambulatory Visit: Payer: Self-pay

## 2020-11-26 ENCOUNTER — Encounter: Payer: Self-pay | Admitting: Family Medicine

## 2020-11-26 VITALS — BP 126/78 | HR 77 | Temp 97.2°F | Ht 68.0 in | Wt 208.0 lb

## 2020-11-26 DIAGNOSIS — Z0000 Encounter for general adult medical examination without abnormal findings: Secondary | ICD-10-CM

## 2020-11-26 DIAGNOSIS — M25569 Pain in unspecified knee: Secondary | ICD-10-CM

## 2020-11-26 DIAGNOSIS — E785 Hyperlipidemia, unspecified: Secondary | ICD-10-CM

## 2020-11-26 DIAGNOSIS — I10 Essential (primary) hypertension: Secondary | ICD-10-CM

## 2020-11-26 DIAGNOSIS — Z7189 Other specified counseling: Secondary | ICD-10-CM

## 2020-11-26 MED ORDER — PRAVASTATIN SODIUM 10 MG PO TABS: 10.0000 mg | ORAL_TABLET | Freq: Every day | ORAL | 3 refills | Status: DC

## 2020-11-26 MED ORDER — AMLODIPINE BESYLATE 10 MG PO TABS: 10.0000 mg | ORAL_TABLET | Freq: Every day | ORAL | 3 refills | Status: DC

## 2020-11-26 MED ORDER — FLUTICASONE PROPIONATE 50 MCG/ACT NA SUSP: 2.0000 | Freq: Every day | NASAL | 3 refills | Status: DC

## 2020-11-26 MED ORDER — LISINOPRIL-HYDROCHLOROTHIAZIDE 20-12.5 MG PO TABS: 1.0000 | ORAL_TABLET | Freq: Every day | ORAL | 3 refills | Status: DC

## 2020-11-26 NOTE — Progress Notes (Signed)
This visit occurred during the SARS-CoV-2 public health emergency.  Safety protocols were in place, including screening questions prior to the visit, additional usage of staff PPE, and extensive cleaning of exam room while observing appropriate contact time as indicated for disinfecting solutions.  CPE- See plan.  Routine anticipatory guidance given to patient.  See health maintenance.  The possibility exists that previously documented standard health maintenance information may have been brought forward from a previous encounter into this note.  If needed, that same information has been updated to reflect the current situation based on today's encounter.    Tetanus 2017 Flu encouraged.  PNA not due.  Shingles d/w pt.   covid vaccine 2021 PSA wnl.  Colonoscopy done 2018 with [facility].  Living will d/w pt. Wife designated if patient were incapacitated.  Diet and exercise d/w pt.  HIV and other recent labs d/w pt.   Hypertension:    Using medication without problems or lightheadedness: yes Chest pain with exertion:no Edema:no Short of breath:no  Wife had gone to rheumatology, had a series of tests re: HBV.  Patient is negative.  Labs discussed with patient.  Hyperlipidemia.  Off statin.  He was prev on simvastatin.  He thought that simvastatin may have made other joint aches worse.  Pain in L knee after getting up from sitting.  Less pain with continued walking.  Compression stockings helped some.    Sugar and lipids d/w pt.  D/w pt about statin retrial and low sugar diet.  Handout given regarding low sugar diet.  Explained and discussed.  PMH and SH reviewed  Meds, vitals, and allergies reviewed.   ROS: Per HPI.  Unless specifically indicated otherwise in HPI, the patient denies:  General: fever. Eyes: acute vision changes ENT: sore throat Cardiovascular: chest pain Respiratory: SOB GI: vomiting GU: dysuria Musculoskeletal: acute back pain Derm: acute rash Neuro: acute motor  dysfunction Psych: worsening mood Endocrine: polydipsia Heme: bleeding Allergy: hayfever  GEN: nad, alert and oriented HEENT: ncat NECK: supple w/o LA CV: rrr. PULM: ctab, no inc wob ABD: soft, +bs EXT: no edema SKIN: no acute rash

## 2020-11-26 NOTE — Patient Instructions (Addendum)
Check with your insurance to see if they will cover the shingrix shot. Use ice, keep moving, and use the knee sleeve.  If worse, ask about seeing Dr. Madelon Lips.   Take care.  Glad to see you. Use the eat right diet.   Try taking pravastatin.  If you have more joint aches, then stop it and let me know.

## 2020-11-28 NOTE — Assessment & Plan Note (Signed)
Tetanus 2017 Flu encouraged.  PNA not due.  Shingles d/w pt.   covid vaccine 2021 PSA wnl.  Colonoscopy done 2018 with [facility].  Living will d/w pt. Wife designated if patient were incapacitated.  Diet and exercise d/w pt.  HIV and other recent labs d/w pt.

## 2020-11-28 NOTE — Assessment & Plan Note (Signed)
Living will d/w pt.  Wife designated if patient were incapacitated.   ?

## 2020-11-28 NOTE — Assessment & Plan Note (Signed)
Continue amlodipine and lisinopril hydrochlorothiazide.  Labs discussed with patient.  Continue work on diet and exercise.

## 2020-11-28 NOTE — Assessment & Plan Note (Signed)
I asked him to follow-up as needed with orthopedics.  He agreed.

## 2020-11-28 NOTE — Assessment & Plan Note (Signed)
Try pravastatin.  Routine cautions given to patient.  If he has muscle aches then stop the medication and let me know.  If he does tolerate the medication without aches then return for labs in about 2 months.  He agrees with plan.

## 2021-03-10 ENCOUNTER — Telehealth: Payer: Self-pay

## 2021-03-10 NOTE — Telephone Encounter (Signed)
Patient is calling in wanting to advise Dr.Duncan that he is covid positive. Declined a visit, saying he is taking OTC the medicine which is helping with his symptoms.

## 2021-03-11 NOTE — Telephone Encounter (Signed)
Thank you for the update.  Please check on patient.  I am glad he is vaccinated.  Patients who are catching the current strain (and have been previously vaccinated) tend to have milder symptoms.  If he is feeling dramatically worse then he needs to get evaluated.  I hope he feels better soon.  Thanks.

## 2021-03-11 NOTE — Telephone Encounter (Signed)
Noted. Thanks.

## 2021-03-11 NOTE — Telephone Encounter (Signed)
Spoke with patient and he actually tested positive for covid on Sunday. He has only had a slight cough and does not to be seen or anything else at this time. Advised patient if he gets worse then to seek eval.

## 2021-03-28 ENCOUNTER — Encounter: Payer: Self-pay | Admitting: Family Medicine

## 2021-03-28 ENCOUNTER — Other Ambulatory Visit: Payer: Self-pay

## 2021-03-28 ENCOUNTER — Telehealth: Payer: Self-pay | Admitting: *Deleted

## 2021-03-28 ENCOUNTER — Ambulatory Visit: Payer: Managed Care, Other (non HMO) | Admitting: Family Medicine

## 2021-03-28 DIAGNOSIS — R42 Dizziness and giddiness: Secondary | ICD-10-CM | POA: Diagnosis not present

## 2021-03-28 NOTE — Telephone Encounter (Signed)
Patient scheduled for an appointment today 03/28/21 at 3:00 pm with Dr. Sharen Hones.

## 2021-03-28 NOTE — Patient Instructions (Signed)
I think you have benign positional vertigo.  May do exercises provided today.  Avoid sudden head movements over the next few days  Let us know if not improving with this, we could consider vestibular rehab.   Benign Positional Vertigo Vertigo is the feeling that you or your surroundings are moving when they are not. Benign positional vertigo is the most common form of vertigo. This is usually a harmless condition (benign). This condition is positional. This means that symptoms are triggered bycertain movements and positions. This condition can be dangerous if it occurs while you are doing something that could cause harm to yourself or others. This includes activities such asdriving or operating machinery. What are the causes? The inner ear has fluid-filled canals that help your brain sense movement and balance. When the fluid moves, the brain receives messages about your body'sposition. With benign positional vertigo, calcium crystals in the inner ear break free and disturb the inner ear area. This causes your brain to receive confusingmessages about your body's position. What increases the risk? You are more likely to develop this condition if: You are a woman. You are 56 years of age or older. You have recently had a head injury. You have an inner ear disease. What are the signs or symptoms? Symptoms of this condition usually happen when you move your head or your eyes in different directions. Symptoms may start suddenly and usually last for less than a minute. They include: Loss of balance and falling. Feeling like you are spinning or moving. Feeling like your surroundings are spinning or moving. Nausea and vomiting. Blurred vision. Dizziness. Involuntary eye movement (nystagmus). Symptoms can be mild and cause only minor problems, or they can be severe and interfere with daily life. Episodes of benign positional vertigo may return (recur) over time. Symptoms may also improve over  time. How is this diagnosed? This condition may be diagnosed based on: Your medical history. A physical exam of the head, neck, and ears. Positional tests to check for or stimulate vertigo. You may be asked to turn your head and change positions, such as going from sitting to lying down. A health care provider will watch for symptoms of vertigo. You may be referred to a health care provider who specializes in ear, nose, and throat problems (ENT or otolaryngologist) or a provider who specializes in disorders of the nervous system (neurologist). How is this treated?  This condition may be treated in a session in which your health care provider moves your head in specific positions to help the displaced crystals in your inner ear move. Treatment for this condition may take several sessions. Surgerymay be needed in severe cases, but this is rare. In some cases, benign positional vertigo may resolve on its own in 2-4 weeks. Follow these instructions at home: Safety Move slowly. Avoid sudden body or head movements or certain positions, as told by your health care provider. Avoid driving or operating machinery until your health care provider says it is safe. Avoid doing any tasks that would be dangerous to you or others if vertigo occurs. If you have trouble walking or keeping your balance, try using a cane for stability. If you feel dizzy or unstable, sit down right away. Return to your normal activities as told by your health care provider. Ask your health care provider what activities are safe for you. General instructions Take over-the-counter and prescription medicines only as told by your health care provider. Drink enough fluid to keep your urine pale yellow.  Keep all follow-up visits. This is important. Contact a health care provider if: You have a fever. Your condition gets worse or you develop new symptoms. Your family or friends notice any behavioral changes. You have nausea or vomiting  that gets worse. You have numbness or a prickling and tingling sensation. Get help right away if you: Have difficulty speaking or moving. Are always dizzy or faint. Develop severe headaches. Have weakness in your legs or arms. Have changes in your hearing or vision. Develop a stiff neck. Develop sensitivity to light. These symptoms may represent a serious problem that is an emergency. Do not wait to see if the symptoms will go away. Get medical help right away. Call your local emergency services (911 in the [place] Do not drive yourself to the hospital. Summary Vertigo is the feeling that you or your surroundings are moving when they are not. Benign positional vertigo is the most common form of vertigo. This condition is caused by calcium crystals in the inner ear that become displaced. This causes a disturbance in an area of the inner ear that helps your brain sense movement and balance. Symptoms include loss of balance and falling, feeling that you or your surroundings are moving, nausea and vomiting, and blurred vision. This condition can be diagnosed based on symptoms, a physical exam, and positional tests. Follow safety instructions as told by your health care provider and keep all follow-up visits. This is important. This information is not intended to replace advice given to you by your health care provider. Make sure you discuss any questions you have with your healthcare provider. Document Revised: 07/21/2020 Document Reviewed: 07/21/2020 Elsevier Patient Education  2022 [employer].

## 2021-03-28 NOTE — Progress Notes (Signed)
Patient ID: Raymond Willis, male    DOB: 1960/11/12, 60 y.o.   MRN: [id number]  This visit was conducted in person.  BP 120/80   Pulse 76   Temp 97.7 F (36.5 C) (Temporal)   Ht 5\' 8"  (1.727 m)   Wt 212 lb (96.2 kg)   SpO2 97%   BMI 32.23 kg/m   Orthostatic VS for the past 24 hrs (Last 3 readings):  BP- Lying BP- Standing at 3 minutes  03/28/21 1511 -- (!) 130/94  03/28/21 1508 140/90 --    CC: dizziness  Subjective:   HPI: Raymond Willis is a 60 y.o. male presenting on 03/28/2021 for Dizziness (C/o dizziness.  Denies any nausea/vomiting.  Started about 1:00 this AM. )   Sudden episode of vertigo that started this morning at aam when he got up from couch to go to the bathroom. Symptoms recurred at 3am when he got up to get into bed. When he woke up at 9am got up slower with less symptoms but still persistent vertigo. No symptoms when supine.   No nausea/vomiting, fevers/chills, hearing change, tinnitus.  He did have COVID a few weeks ago.  No alcohol use.  Truck driver.   Has never had similar symptoms.  Denies inciting trauma/injury or fall.   HLD - pravastatin started 11/2020. Also h/o HTN and gout.      Relevant past medical, surgical, family and social history reviewed and updated as indicated. Interim medical history since our last visit reviewed. Allergies and medications reviewed and updated. Outpatient Medications Prior to Visit  Medication Sig Dispense Refill   acetaminophen (TYLENOL) 500 MG tablet Take 1 tablet (500 mg total) by mouth every 6 (six) hours as needed for mild pain. 30 tablet 0   amLODipine (NORVASC) 10 MG tablet Take 1 tablet (10 mg total) by mouth daily. 90 tablet 3   Ascorbic Acid (VITAMIN C PO) Take by mouth daily.     Cholecalciferol (VITAMIN D3 PO) Take by mouth daily.     colchicine 0.6 MG tablet Take 1 tablet (0.6 mg total) by mouth daily as needed (for gout flare ups). Okay to fill with mitigare or colcrys if cheaper. 30 tablet 0    diclofenac Sodium (VOLTAREN) 1 % GEL Apply 2 g topically 2 (two) times daily as needed. 300 g 12   fluticasone (FLONASE) 50 MCG/ACT nasal spray Place 2 sprays into both nostrils daily. 48 g 3   lisinopril-hydrochlorothiazide (ZESTORETIC) 20-12.5 MG tablet Take 1 tablet by mouth daily. 90 tablet 3   Multiple Vitamins-Minerals (ZINC PO) Take by mouth daily.     pravastatin (PRAVACHOL) 10 MG tablet Take 1 tablet (10 mg total) by mouth daily. 90 tablet 3   triamcinolone cream (KENALOG) 0.5 % Apply 1 application topically 2 (two) times daily as needed (IRRITATION). 120 g 2   No facility-administered medications prior to visit.     Per HPI unless specifically indicated in ROS section below Review of Systems  Objective:  BP 120/80   Pulse 76   Temp 97.7 F (36.5 C) (Temporal)   Ht 5\' 8"  (1.727 m)   Wt 212 lb (96.2 kg)   SpO2 97%   BMI 32.23 kg/m   Wt Readings from Last 3 Encounters:  03/28/21 212 lb (96.2 kg)  11/26/20 208 lb (94.3 kg)  01/29/20 212 lb (96.2 kg)      Physical Exam Vitals and nursing note reviewed.  Constitutional:      Appearance: Normal  appearance.  HENT:     Right Ear: Tympanic membrane, ear canal and external ear normal.     Left Ear: Tympanic membrane, ear canal and external ear normal.  Eyes:     Extraocular Movements: Extraocular movements intact.     Pupils: Pupils are equal, round, and reactive to light.  Neck:     Vascular: No carotid bruit.  Cardiovascular:     Rate and Rhythm: Normal rate and regular rhythm.     Pulses: Normal pulses.     Heart sounds: Normal heart sounds. No murmur heard. Pulmonary:     Effort: Pulmonary effort is normal. No respiratory distress.     Breath sounds: Normal breath sounds. No wheezing, rhonchi or rales.  Musculoskeletal:     Cervical back: Normal range of motion and neck supple.  Neurological:     General: No focal deficit present.     Mental Status: He is alert.     Comments:  CN 2-12 intact FTN intact EOMI   Dix hallpike slightly positive on right  Modified epley repositioning maneuver performed on right        Assessment & Plan:  This visit occurred during the SARS-CoV-2 public health emergency.  Safety protocols were in place, including screening questions prior to the visit, additional usage of staff PPE, and extensive cleaning of exam room while observing appropriate contact time as indicated for disinfecting solutions.   Problem List Items Addressed This Visit     Vertigo    Story/exam most consistent with BPPV, not consistent with central cause like stroke or other peripheral cause like vestibular neuritis, labrynthitis. Not consistent with meniere's. Discussed with patient, handout provided. Provided with modified epley maneuvers to perform at home. Update if not improving with this.          No orders of the defined types were placed in this encounter.  No orders of the defined types were placed in this encounter.   Patient instructions: I think you have benign positional vertigo.  May do exercises provided today.  Avoid sudden head movements over the next few days  Let us know if not improving with this, we could consider vestibular rehab.   Follow up plan: Return if symptoms worsen or fail to improve.  Eustaquio Boyden, MD

## 2021-03-28 NOTE — Assessment & Plan Note (Signed)
Story/exam most consistent with BPPV, not consistent with central cause like stroke or other peripheral cause like vestibular neuritis, labrynthitis. Not consistent with meniere's. Discussed with patient, handout provided. Provided with modified epley maneuvers to perform at home. Update if not improving with this.

## 2021-03-28 NOTE — Telephone Encounter (Signed)
[facility] Day - Client TELEPHONE ADVICE RECORD [facility] Patient Name: Raymond Willis [place] Gender: Male DOB: 1961-01-01 Age: 60 Y 4 M 17 D Return Phone Number: [phone] (Primary) Address: City/ State/ Zip: [place]  [address] Client [facility] - Client Client Site [facility] - Day Physician Raechel Ache - MD Contact Type Call Who Is Calling Patient / Member / Family / Caregiver Call Type Triage / Clinical Relationship To Patient Self Return Phone Number [phone] (Primary) Chief Complaint Dizziness Reason for Call Symptomatic / Request for Health Information Initial Comment Caller states he has dizziness. Translation No Nurse Assessment Nurse: Elesa Hacker, RN, Nash Dimmer Date/Time Lamount Cohen Time): 03/28/2021 10:14:25 AM Confirm and document reason for call. If symptomatic, describe symptoms. ---Caller advised that he woke up at 2 am and went to the restroom and he was dizzy. Advised that the room is spinning. Does the patient have any new or worsening symptoms? ---Yes Will a triage be completed? ---Yes Related visit to physician within the last 2 weeks? ---No Does the PT have any chronic conditions? (i.e. diabetes, asthma, this includes High risk factors for pregnancy, etc.) ---Yes List chronic conditions. ---HTN Is this a behavioral health or substance abuse call? ---No Guidelines Guideline Title Affirmed Question Affirmed Notes Nurse Date/Time (Eastern Time) Dizziness - Vertigo [1] MODERATE dizziness (e.g., vertigo; feels very unsteady, interferes with normal activities) AND [2] has NOT been evaluated by physician for this Elesa Hacker, RN, Nash Dimmer 03/28/2021 10:15:16 AM Disp. Time Lamount Cohen Time) Disposition Final User PLEASE NOTE: All timestamps contained within this report are represented as [place] Standard Time. CONFIDENTIALTY NOTICE: This fax transmission is intended only for the  addressee. It contains information that is legally privileged, confidential or otherwise protected from use or disclosure. If you are not the intended recipient, you are strictly prohibited from reviewing, disclosing, copying using or disseminating any of this information or taking any action in reliance on or regarding this information. If you have received this fax in error, please notify us immediately by telephone so that we can arrange for its return to [place]. Phone: [phone], Toll-Free: [phone], Fax: [phone] Page: 2 of 2 Call Id: [id number] 03/28/2021 10:18:43 AM See PCP within 24 Hours Yes Deaton, RN, Cory Roughen Disagree/Comply Comply Caller Understands Yes PreDisposition Did not know what to do Care Advice Given Per Guideline SEE PCP WITHIN 24 HOURS: * IF OFFICE WILL BE OPEN: You need to be examined within the next 24 hours. Call your doctor (or NP/PA) when the office opens and make an appointment. CALL BACK IF: * Severe headache occurs * Weakness develops in an arm or leg * Unable to walk without falling * You become worse CARE ADVICE given per Dizziness - Vertigo (Adult) guideline. Comments User: Wandra Scot, RN Date/Time Lamount Cohen Time): 03/28/2021 10:20:07 AM Caller transferred to the office to schedule an appt. Referrals REFERRED TO PCP OFFIC

## 2021-09-22 ENCOUNTER — Other Ambulatory Visit: Payer: Self-pay

## 2021-09-22 MED ORDER — PRAVASTATIN SODIUM 10 MG PO TABS: 10.0000 mg | ORAL_TABLET | Freq: Every day | ORAL | 0 refills | Status: DC

## 2021-10-07 NOTE — Telephone Encounter (Signed)
Date of telephone call 09/12/17 for refill request. Please sign and close encounter so staff can empty the encounter from their "Open Encounters" Basket.  Thank you.

## 2021-10-24 ENCOUNTER — Telehealth: Payer: Self-pay | Admitting: Family Medicine

## 2021-10-24 MED ORDER — PRAVASTATIN SODIUM 10 MG PO TABS: 10.0000 mg | ORAL_TABLET | Freq: Every day | ORAL | 1 refills | Status: DC

## 2021-10-24 MED ORDER — LISINOPRIL-HYDROCHLOROTHIAZIDE 20-12.5 MG PO TABS: 1.0000 | ORAL_TABLET | Freq: Every day | ORAL | 3 refills | Status: DC

## 2021-10-24 MED ORDER — AMLODIPINE BESYLATE 10 MG PO TABS: 10.0000 mg | ORAL_TABLET | Freq: Every day | ORAL | 3 refills | Status: DC

## 2021-10-24 NOTE — Telephone Encounter (Signed)
°  Encourage patient to contact the pharmacy for refills or they can request refills through [facility]  LAST APPOINTMENT DATE:  Please schedule appointment if longer than 1 year  NEXT APPOINTMENT DATE:  MEDICATION:amLODipine (NORVASC) 10 MG tablet,lisinopril-hydrochlorothiazide (ZESTORETIC) 20-12.5 MG tablet,pravastatin (PRAVACHOL) 10 MG tablet  Is the patient out of medication?   PHARMACY:[facility] [address] [place] [address]       Let patient know to contact pharmacy at the end of the day to make sure medication is ready.  Please notify patient to allow 48-72 hours to process  CLINICAL FILLS OUT ALL BELOW:   LAST REFILL:  QTY:  REFILL DATE:    OTHER COMMENTS:    Okay for refill?  Please advise

## 2021-10-24 NOTE — Telephone Encounter (Signed)
ERX sent and patient aware

## 2021-11-28 ENCOUNTER — Other Ambulatory Visit: Payer: Self-pay

## 2021-11-28 ENCOUNTER — Ambulatory Visit: Payer: Managed Care, Other (non HMO) | Admitting: Family Medicine

## 2021-11-28 ENCOUNTER — Encounter: Payer: Self-pay | Admitting: Family Medicine

## 2021-11-28 VITALS — BP 138/86 | HR 74 | Temp 97.5°F | Ht 68.0 in | Wt 215.0 lb

## 2021-11-28 DIAGNOSIS — M1000 Idiopathic gout, unspecified site: Secondary | ICD-10-CM | POA: Diagnosis not present

## 2021-11-28 DIAGNOSIS — I10 Essential (primary) hypertension: Secondary | ICD-10-CM | POA: Diagnosis not present

## 2021-11-28 DIAGNOSIS — Z0000 Encounter for general adult medical examination without abnormal findings: Secondary | ICD-10-CM | POA: Diagnosis not present

## 2021-11-28 DIAGNOSIS — E785 Hyperlipidemia, unspecified: Secondary | ICD-10-CM

## 2021-11-28 DIAGNOSIS — Z7189 Other specified counseling: Secondary | ICD-10-CM

## 2021-11-28 DIAGNOSIS — Z125 Encounter for screening for malignant neoplasm of prostate: Secondary | ICD-10-CM | POA: Diagnosis not present

## 2021-11-28 LAB — LIPID PANEL
Cholesterol: 208 mg/dL — ABNORMAL HIGH (ref 0–200)
HDL: 52.3 mg/dL (ref >39.00)
LDL Cholesterol: 135 mg/dL — ABNORMAL HIGH (ref 0–99)
NonHDL: 155.46
Total CHOL/HDL Ratio: 4
Triglycerides: 100 mg/dL (ref 0.0–149.0)
VLDL: 20 mg/dL (ref 0.0–40.0)

## 2021-11-28 LAB — COMPREHENSIVE METABOLIC PANEL
ALT: 16 U/L (ref 0–53)
AST: 14 U/L (ref 0–37)
Albumin: 4.3 g/dL (ref 3.5–5.2)
Alkaline Phosphatase: 81 U/L (ref 39–117)
BUN: 15 mg/dL (ref 6–23)
CO2: 29 mEq/L (ref 19–32)
Calcium: 8.9 mg/dL (ref 8.4–10.5)
Chloride: 105 mEq/L (ref 96–112)
Creatinine, Ser: 1.21 mg/dL (ref 0.40–1.50)
GFR: 64.83 mL/min (ref >60.00)
Glucose, Bld: 97 mg/dL (ref 70–99)
Potassium: 3.8 mEq/L (ref 3.5–5.1)
Sodium: 140 mEq/L (ref 135–145)
Total Bilirubin: 0.6 mg/dL (ref 0.2–1.2)
Total Protein: 6.9 g/dL (ref 6.0–8.3)

## 2021-11-28 LAB — URIC ACID: Uric Acid, Serum: 7.1 mg/dL (ref 4.0–7.8)

## 2021-11-28 MED ORDER — COLCHICINE 0.6 MG PO TABS: 0.6000 mg | ORAL_TABLET | Freq: Every day | ORAL | 0 refills | Status: DC | PRN

## 2021-11-28 NOTE — Patient Instructions (Addendum)
Go to the lab on the way out.   If you have mychart we'll likely use that to update you.    ?Take care.  Glad to see you. ?The GI clinic should call you this fall.  Let me know if you have trouble getting an appointment.   ? ?Check to see if want to use mail order going forward.  If so, let me know which one.   ?

## 2021-11-28 NOTE — Progress Notes (Signed)
This visit occurred during the SARS-CoV-2 public health emergency.  Safety protocols were in place, including screening questions prior to the visit, additional usage of staff PPE, and extensive cleaning of exam room while observing appropriate contact time as indicated for disinfecting solutions. ? ?CPE- See plan.  Routine anticipatory guidance given to patient.  See health maintenance.  The possibility exists that previously documented standard health maintenance information may have been brought forward from a previous encounter into this note.  If needed, that same information has been updated to reflect the current situation based on today's encounter.   ? ?Tetanus 2017 ?Flu encouraged.  ?PNA not due.  ?Shingles done 2022 ?covid vaccine 2021 ?PSA pending 2023 ?Colonoscopy done 2018 with [facility].  D/w pt about f/u fall 2023.  ?Living will d/w pt.  Wife designated if patient were incapacitated.   ?Diet and exercise d/w pt.  ? ?Hypertension:    ?Using medication without problems or lightheadedness: yes ?Chest pain with exertion:no ?Edema:no ?Short of breath:no ?Labs pending.   ? ?Elevated Cholesterol: ?Using medications without problems: yes ?Muscle aches: no ?Diet compliance: yes ?Exercise: yes ? ?Gout.  No recent flare.  D/w pt.  No recent colchicine.  Statin caution d/w pt.   ? ?PMH and SH reviewed ? ?Meds, vitals, and allergies reviewed.  ? ?ROS: Per HPI.  Unless specifically indicated otherwise in HPI, the patient denies: ? ?General: fever. ?Eyes: acute vision changes ?ENT: sore throat ?Cardiovascular: chest pain ?Respiratory: SOB ?GI: vomiting ?GU: dysuria ?Musculoskeletal: acute back pain ?Derm: acute rash ?Neuro: acute motor dysfunction ?Psych: worsening mood ?Endocrine: polydipsia ?Heme: bleeding ?Allergy: hayfever ? ?GEN: nad, alert and oriented ?HEENT: ncat ?NECK: supple w/o LA ?CV: rrr. ?PULM: ctab, no inc wob ?ABD: soft, +bs ?EXT: no edema ?SKIN: no acute rash ?

## 2021-11-29 LAB — PSA: PSA: 1.87 ng/mL (ref 0.10–4.00)

## 2021-11-30 NOTE — Assessment & Plan Note (Signed)
Living will d/w pt.  Wife designated if patient were incapacitated.   ?

## 2021-11-30 NOTE — Assessment & Plan Note (Signed)
Tetanus 2017 ?Flu encouraged.  ?PNA not due.  ?Shingles done 2022 ?covid vaccine 2021 ?PSA pending 2023 ?Colonoscopy done 2018 with [facility].  D/w pt about f/u fall 2023.  ?Living will d/w pt.  Wife designated if patient were incapacitated.   ?Diet and exercise d/w pt.  ?

## 2021-11-30 NOTE — Assessment & Plan Note (Signed)
Use colchicine as needed.  Statin cautions discussed with patient. ?

## 2021-11-30 NOTE — Assessment & Plan Note (Signed)
Continue amlodipine and lisinopril/hydrochlorothiazide.  Would still continue hydrochlorothiazide for now since it is likely helping his blood pressure and he is not having recurrent/frequent gout flares.  Rationale discussed.  He understood.  He agreed. ?

## 2021-11-30 NOTE — Assessment & Plan Note (Signed)
Continue pravastatin 

## 2021-12-18 ENCOUNTER — Other Ambulatory Visit: Payer: Self-pay | Admitting: Family Medicine

## 2022-04-19 ENCOUNTER — Ambulatory Visit: Payer: Managed Care, Other (non HMO) | Admitting: Podiatry

## 2022-04-19 ENCOUNTER — Encounter: Payer: Self-pay | Admitting: Podiatry

## 2022-04-19 DIAGNOSIS — M79672 Pain in left foot: Secondary | ICD-10-CM

## 2022-04-19 DIAGNOSIS — Q828 Other specified congenital malformations of skin: Secondary | ICD-10-CM

## 2022-04-25 NOTE — Progress Notes (Signed)
  Subjective:  Patient ID: Raymond Willis, male    DOB: March 10, 1961,  MRN: [id number]  Raymond Willis presents to clinic today for painful porokeratotic lesions left lower extremity. Pain prevent(s) comfortable ambulation. Aggravating factor is weightbearing with and without shoegear.  New problem(s): None.   PCP is Joaquim Nam, MD , and last visit was November 28, 2021.  Allergies  Allergen Reactions   Penicillins Hives and Other (See Comments)    Did it involve swelling of the face/tongue/throat, SOB, or low BP? Unknown Did it involve sudden or severe rash/hives, skin peeling, or any reaction on the inside of your mouth or nose? Unknown Did you need to seek medical attention at a hospital or doctor's office? Unknown When did it last happen?      Childhood allergy If all above answers are "NO", may proceed with cephalosporin use.    Sildenafil Nausea And Vomiting and Other (See Comments)    Possible cause of GI upset   Review of Systems: Negative except as noted in the HPI.  Objective: No changes noted in today's physical examination.  Vascular Examination: Palpable pedal pulses b/l LE. Digital hair present b/l. No pedal edema b/l. Skin temperature gradient WNL b/l. No varicosities b/l. No pain with calf compression b/l. No cyanosis or clubbing noted b/l LE.Marland Kitchen  Dermatological Examination: Pedal skin with normal turgor, texture and tone b/l. No open wounds. No interdigital macerations b/l. Toenails 1-5 b/l well maintained with adequate length. No erythema, no edema, no drainage, no fluctuance. Porokeratotic lesion(s) medial IPJ of left great toe and submet head 5 left foot. No erythema, no edema, no drainage, no fluctuance..  Neurological Examination: Protective sensation intact with 10 gram monofilament b/l LE. Vibratory sensation intact b/l LE.   Musculoskeletal Examination: Muscle strength 5/5 to all LE muscle groups b/l. Muscle strength 5/5 to all lower extremity muscle groups  bilaterally. HAV with bunion deformity noted b/l LE.  Assessment/Plan: 1. Porokeratosis   2. Left foot pain     -Patient was evaluated and treated. All patient's and/or POA's questions/concerns answered on today's visit. -No new findings. No new orders. -Patient to continue soft, supportive shoe gear daily. -Porokeratotic lesion(s) medial IPJ of left great toe and submet head 5 left foot pared and enucleated with sterile scalpel blade without incident. Total number of lesions debrided=2. -Patient/POA to call should there be question/concern in the interim.   Return in about 3 months (around 07/20/2022).  Freddie Breech, DPM

## 2022-11-10 ENCOUNTER — Ambulatory Visit: Payer: Managed Care, Other (non HMO) | Admitting: Family Medicine

## 2022-11-10 ENCOUNTER — Encounter: Payer: Self-pay | Admitting: Family Medicine

## 2022-11-10 VITALS — BP 132/84 | HR 78 | Temp 97.7°F | Ht 68.0 in | Wt 223.0 lb

## 2022-11-10 DIAGNOSIS — E785 Hyperlipidemia, unspecified: Secondary | ICD-10-CM

## 2022-11-10 DIAGNOSIS — R739 Hyperglycemia, unspecified: Secondary | ICD-10-CM

## 2022-11-10 DIAGNOSIS — M1000 Idiopathic gout, unspecified site: Secondary | ICD-10-CM

## 2022-11-10 DIAGNOSIS — Z7189 Other specified counseling: Secondary | ICD-10-CM

## 2022-11-10 DIAGNOSIS — Z0000 Encounter for general adult medical examination without abnormal findings: Secondary | ICD-10-CM

## 2022-11-10 DIAGNOSIS — I10 Essential (primary) hypertension: Secondary | ICD-10-CM

## 2022-11-10 DIAGNOSIS — Z125 Encounter for screening for malignant neoplasm of prostate: Secondary | ICD-10-CM

## 2022-11-10 MED ORDER — LISINOPRIL-HYDROCHLOROTHIAZIDE 20-12.5 MG PO TABS: 1.0000 | ORAL_TABLET | Freq: Every day | ORAL | 3 refills | Status: DC

## 2022-11-10 MED ORDER — PRAVASTATIN SODIUM 10 MG PO TABS: 10.0000 mg | ORAL_TABLET | Freq: Every day | ORAL | 3 refills | Status: DC

## 2022-11-10 MED ORDER — AMLODIPINE BESYLATE 10 MG PO TABS
10.0000 mg | ORAL_TABLET | Freq: Every day | ORAL | 3 refills | Status: DC
Start: 2022-11-10 — End: 2023-07-06

## 2022-11-10 MED ORDER — COLCHICINE 0.6 MG PO TABS: 0.6000 mg | ORAL_TABLET | Freq: Every day | ORAL | 0 refills | Status: DC | PRN

## 2022-11-10 NOTE — Progress Notes (Unsigned)
CPE- See plan.  Routine anticipatory guidance given to patient.  See health maintenance.  The possibility exists that previously documented standard health maintenance information may have been brought forward from a previous encounter into this note.  If needed, that same information has been updated to reflect the current situation based on today's encounter.    Tetanus 2017 Flu encouraged.  PNA not due.  Shingles done 2022 covid vaccine 2021 PSA pending 2024 Colonoscopy done 2018 with [facility].  D/w pt about f/u. Living will d/w pt.  Wife designated if patient were incapacitated.   Diet and exercise d/w pt.   Elevated Cholesterol: Using medications without problems: no issue other than some missed doses.   Muscle aches: not likely from statin.  He has h/o knee discomfort after prolonged sitting.   Diet compliance: yes Exercise: yes He had eaten a few hours ago.    Hypertension:    Using medication without problems or lightheadedness: yes Chest pain with exertion:no Edema:no Short of breath:no Labs pending.   No recent gout sx.  Labs pending.    PMH and SH reviewed  Meds, vitals, and allergies reviewed.   ROS: Per HPI.  Unless specifically indicated otherwise in HPI, the patient denies:  General: fever. Eyes: acute vision changes ENT: sore throat Cardiovascular: chest pain Respiratory: SOB GI: vomiting GU: dysuria Musculoskeletal: acute back pain Derm: acute rash Neuro: acute motor dysfunction Psych: worsening mood Endocrine: polydipsia Heme: bleeding Allergy: hayfever  GEN: nad, alert and oriented HEENT: mucous membranes moist NECK: supple w/o LA CV: rrr. PULM: ctab, no inc wob ABD: soft, +bs EXT: no edema SKIN: no acute rash

## 2022-11-10 NOTE — Patient Instructions (Addendum)
Go to the lab on the way out.   If you have mychart we'll likely use that to update you.    Let me know if you need a referral to GI clinic.  Please call about follow up.  Take care.  Glad to see you.

## 2022-11-11 LAB — LIPID PANEL
Cholesterol: 216 mg/dL — ABNORMAL HIGH (ref <200)
HDL: 46 mg/dL (ref >=40)
LDL Cholesterol (Calc): 128 mg/dL (calc) — ABNORMAL HIGH
Non-HDL Cholesterol (Calc): 170 mg/dL (calc) — ABNORMAL HIGH (ref <130)
Total CHOL/HDL Ratio: 4.7 (calc) (ref <5.0)
Triglycerides: 271 mg/dL — ABNORMAL HIGH (ref <150)

## 2022-11-11 LAB — COMPREHENSIVE METABOLIC PANEL
AG Ratio: 1.4 (calc) (ref 1.0–2.5)
ALT: 16 U/L (ref 9–46)
AST: 15 U/L (ref 10–35)
Albumin: 4.2 g/dL (ref 3.6–5.1)
Alkaline phosphatase (APISO): 91 U/L (ref 35–144)
BUN/Creatinine Ratio: 9 (calc) (ref 6–22)
BUN: 12 mg/dL (ref 7–25)
CO2: 26 mmol/L (ref 20–32)
Calcium: 9.7 mg/dL (ref 8.6–10.3)
Chloride: 104 mmol/L (ref 98–110)
Creat: 1.36 mg/dL — ABNORMAL HIGH (ref 0.70–1.35)
Globulin: 3.1 g/dL (calc) (ref 1.9–3.7)
Glucose, Bld: 98 mg/dL (ref 65–99)
Potassium: 4.2 mmol/L (ref 3.5–5.3)
Sodium: 141 mmol/L (ref 135–146)
Total Bilirubin: 0.5 mg/dL (ref 0.2–1.2)
Total Protein: 7.3 g/dL (ref 6.1–8.1)

## 2022-11-11 LAB — PSA: PSA: 2.04 ng/mL (ref <=4.00)

## 2022-11-11 LAB — HEMOGLOBIN A1C
Hgb A1c MFr Bld: 6.3 % of total Hgb — ABNORMAL HIGH (ref <5.7)
Mean Plasma Glucose: 134 mg/dL
eAG (mmol/L): 7.4 mmol/L

## 2022-11-11 LAB — URIC ACID: Uric Acid, Serum: 6.4 mg/dL (ref 4.0–8.0)

## 2022-11-12 ENCOUNTER — Other Ambulatory Visit: Payer: Self-pay | Admitting: Family Medicine

## 2022-11-12 DIAGNOSIS — R739 Hyperglycemia, unspecified: Secondary | ICD-10-CM

## 2022-11-12 NOTE — Assessment & Plan Note (Signed)
No recent episodes.  Labs pending.

## 2022-11-12 NOTE — Assessment & Plan Note (Signed)
Living will d/w pt.  Wife designated if patient were incapacitated.   ?

## 2022-11-12 NOTE — Assessment & Plan Note (Signed)
Continue lisinopril hydrochlorothiazide.  See notes on labs.  Continue work on diet and exercise.

## 2022-11-12 NOTE — Assessment & Plan Note (Signed)
See notes on labs.  Continue pravastatin.  He is able to tolerate medication but he has missed a few doses recently.

## 2022-11-12 NOTE — Assessment & Plan Note (Signed)
Tetanus 2017 Flu encouraged.  PNA not due.  Shingles done 2022 covid vaccine 2021 PSA pending 2024 Colonoscopy done 2018 with [facility].  D/w pt about f/u.  See after visit summary.  He can call for an appointment. Living will d/w pt.  Wife designated if patient were incapacitated.   Diet and exercise d/w pt.

## 2022-12-18 ENCOUNTER — Encounter: Payer: Self-pay | Admitting: Podiatry

## 2022-12-18 ENCOUNTER — Ambulatory Visit: Payer: Managed Care, Other (non HMO) | Admitting: Podiatry

## 2022-12-18 VITALS — BP 159/106

## 2022-12-18 DIAGNOSIS — Q828 Other specified congenital malformations of skin: Secondary | ICD-10-CM | POA: Diagnosis not present

## 2022-12-18 DIAGNOSIS — M79672 Pain in left foot: Secondary | ICD-10-CM

## 2022-12-18 DIAGNOSIS — M79671 Pain in right foot: Secondary | ICD-10-CM

## 2022-12-18 NOTE — Progress Notes (Signed)
  Subjective:  Patient ID: Raymond Willis, male    DOB: 08/27/1961,  MRN: [id number]  Raymond Willis presents to clinic today for   Chief Complaint  Patient presents with   Nail Problem    RFC/Callus PCP-Graham Duncan PCP VST-11/10/2022    New problem(s): None.   PCP is Joaquim Nam, MD.  Allergies  Allergen Reactions   Penicillins Hives and Other (See Comments)    Did it involve swelling of the face/tongue/throat, SOB, or low BP? Unknown Did it involve sudden or severe rash/hives, skin peeling, or any reaction on the inside of your mouth or nose? Unknown Did you need to seek medical attention at a hospital or doctor's office? Unknown When did it last happen?      Childhood allergy If all above answers are "NO", may proceed with cephalosporin use.    Sildenafil Nausea And Vomiting and Other (See Comments)    Possible cause of GI upset    Review of Systems: Negative except as noted in the HPI.  Objective: No changes noted in today's physical examination. Vitals:   12/18/22 1115  BP: (!) 159/106   Raymond Willis is a pleasant 62 y.o. male WD, WN in NAD. AAO x 3.  Vascular Examination: Palpable pedal pulses b/l LE. Digital hair present b/l. No pedal edema b/l. Skin temperature gradient WNL b/l. No varicosities b/l. No pain with calf compression b/l. No cyanosis or clubbing noted b/l LE.Marland Kitchen  Dermatological Examination: Pedal skin with normal turgor, texture and tone b/l. No open wounds. No interdigital macerations b/l. Toenails 1-5 b/l well maintained with adequate length. No erythema, no edema, no drainage, no fluctuance.   Porokeratotic lesion(s) medial IPJ of left great toe and submet head 5 right foot. No erythema, no edema, no drainage, no fluctuance.  Neurological Examination: Protective sensation intact with 10 gram monofilament b/l LE. Vibratory sensation intact b/l LE.   Musculoskeletal Examination: Muscle strength 5/5 to all LE muscle groups b/l. Muscle  strength 5/5 to all lower extremity muscle groups bilaterally. HAV with bunion deformity noted b/l LE.  Assessment/Plan: 1. Porokeratosis   2. Left foot pain   3. Right foot pain    -Consent given for treatment as described below: -Examined patient. -Continue Skechers shoe gear when not wearing his steel toe boots. -Porokeratotic lesion(s) left great toe and submet head 5 right foot pared and enucleated with sterile currette without incident. Total number of lesions debrided=2. -Patient/POA to call should there be question/concern in the interim.   Return in about 3 months (around 03/19/2023).  Freddie Breech, DPM

## 2023-02-01 ENCOUNTER — Telehealth: Payer: Self-pay | Admitting: Family Medicine

## 2023-02-01 MED ORDER — COLCHICINE 0.6 MG PO TABS: 0.6000 mg | ORAL_TABLET | Freq: Every day | ORAL | 1 refills | Status: DC | PRN

## 2023-02-01 NOTE — Telephone Encounter (Signed)
Erx sent

## 2023-02-01 NOTE — Telephone Encounter (Signed)
Prescription Request  02/01/2023  LOV: 11/10/2022  What is the name of the medication or equipment? colchicine 0.6 MG tablet   Have you contacted your pharmacy to request a refill? No   Which pharmacy would you like this sent to?  [id number] - [place] ([place]), [place] - [address] [place] ([place]) [place] [address] Phone: [phone] Fax: [phone]  Patient notified that their request is being sent to the clinical staff for review and that they should receive a response within 2 business days.   Please advise at Mobile [phone] (mobile)  Patint believes he may be having a gout flare up and has asked for a refill

## 2023-02-06 ENCOUNTER — Encounter: Payer: Self-pay | Admitting: Family Medicine

## 2023-02-06 ENCOUNTER — Ambulatory Visit: Payer: Managed Care, Other (non HMO) | Admitting: Family Medicine

## 2023-02-06 VITALS — BP 122/84 | HR 74 | Temp 98.1°F | Ht 68.0 in | Wt 215.0 lb

## 2023-02-06 DIAGNOSIS — M1000 Idiopathic gout, unspecified site: Secondary | ICD-10-CM | POA: Diagnosis not present

## 2023-02-06 NOTE — Patient Instructions (Addendum)
Ask the front to get your labs moved to Monday 02/12/23.   Use tylenol if needed but avoid ibuprofen and aleve.   Colchicine if you have a flare.  Take care.  Glad to see you.

## 2023-02-06 NOTE — Progress Notes (Unsigned)
H/o gout.  Had gone years w/o a flare.  He took ibuprofen/colchicine recently.   D/w pt about recheck Cr next week.  D/w pt about HCTZ use.  He had some red meat but no other clear triggers recently.  No trauma.  Colchicine helped.     He has podiatry f/u pending.   Meds, vitals, and allergies reviewed.   ROS: Per HPI unless specifically indicated in ROS section   Nad [place] Neck supple, no LA R knee normal exam, not tender to palpation on the medial lateral joint line.  No crepitus.  No bruising or erythema.  Able to bear weight. B calf 39 cm circumference.   Rrr Ctab

## 2023-02-08 NOTE — Assessment & Plan Note (Signed)
Discussed hydrochlorothiazide use.  He gets blood pressure benefit from that and he has rare flares overall.  Unclear if red meat was the trigger for him recently.  Discussed avoiding NSAIDs, using colchicine as needed, and rechecking his labs in the near future.  It appears reasonable to continue hydrochlorothiazide as is for now.  It does not appear that he needs prophylactic medication at this point, based on his overall rare gout flares.

## 2023-02-12 ENCOUNTER — Ambulatory Visit: Payer: Managed Care, Other (non HMO) | Admitting: Podiatry

## 2023-02-12 ENCOUNTER — Other Ambulatory Visit (INDEPENDENT_AMBULATORY_CARE_PROVIDER_SITE_OTHER): Payer: Managed Care, Other (non HMO)

## 2023-02-12 DIAGNOSIS — M79671 Pain in right foot: Secondary | ICD-10-CM | POA: Diagnosis not present

## 2023-02-12 DIAGNOSIS — R739 Hyperglycemia, unspecified: Secondary | ICD-10-CM

## 2023-02-12 DIAGNOSIS — Q828 Other specified congenital malformations of skin: Secondary | ICD-10-CM

## 2023-02-12 DIAGNOSIS — M79672 Pain in left foot: Secondary | ICD-10-CM

## 2023-02-12 LAB — BASIC METABOLIC PANEL
BUN: 12 mg/dL (ref 6–23)
CO2: 24 mEq/L (ref 19–32)
Calcium: 9.2 mg/dL (ref 8.4–10.5)
Chloride: 103 mEq/L (ref 96–112)
Creatinine, Ser: 1.25 mg/dL (ref 0.40–1.50)
GFR: 61.82 mL/min (ref >60.00)
Glucose, Bld: 102 mg/dL — ABNORMAL HIGH (ref 70–99)
Potassium: 3.8 mEq/L (ref 3.5–5.1)
Sodium: 137 mEq/L (ref 135–145)

## 2023-02-12 LAB — HEMOGLOBIN A1C: Hgb A1c MFr Bld: 6.3 % (ref 4.6–6.5)

## 2023-02-13 ENCOUNTER — Other Ambulatory Visit: Payer: Managed Care, Other (non HMO)

## 2023-02-15 ENCOUNTER — Encounter: Payer: Self-pay | Admitting: Family Medicine

## 2023-02-17 ENCOUNTER — Encounter: Payer: Self-pay | Admitting: Podiatry

## 2023-02-17 NOTE — Progress Notes (Signed)
  Subjective:  Patient ID: ANTWOIN SAWAYA, male    DOB: 07-09-1961,  MRN: [id number]  Raymond Willis presents to clinic today for painful porokeratotic lesions both feet. Pain prevent(s) comfortable ambulation. Aggravating factor is weightbearing with and without shoegear.  Chief Complaint  Patient presents with   Painful porokeratotic lesions of both feet    RFC,Referring Provider Joaquim Nam, MD,lov :06/24   New problem(s): None.   PCP is Joaquim Nam, MD.  Allergies  Allergen Reactions   Penicillins Hives and Other (See Comments)    Did it involve swelling of the face/tongue/throat, SOB, or low BP? Unknown Did it involve sudden or severe rash/hives, skin peeling, or any reaction on the inside of your mouth or nose? Unknown Did you need to seek medical attention at a hospital or doctor's office? Unknown When did it last happen?      Childhood allergy If all above answers are "NO", may proceed with cephalosporin use.    Sildenafil Nausea And Vomiting and Other (See Comments)    Possible cause of GI upset    Review of Systems: Negative except as noted in the HPI.  Objective:  There were no vitals filed for this visit. Raymond Willis is a pleasant 62 y.o. male obese in NAD. AAO x 3.  Vascular Examination: Palpable pedal pulses b/l LE. Digital hair present b/l. No pedal edema b/l. Skin temperature gradient WNL b/l. No varicosities b/l. No pain with calf compression b/l. No cyanosis or clubbing noted b/l LE.Marland Kitchen  Dermatological Examination: Pedal skin with normal turgor, texture and tone b/l. No open wounds. No interdigital macerations b/l. Toenails 1-5 b/l well maintained with adequate length. No erythema, no edema, no drainage, no fluctuance.   Porokeratotic lesion(s) medial IPJ of left great toe and submet head 5 right foot. No erythema, no edema, no drainage, no fluctuance.  Neurological Examination: Protective sensation intact with 10 gram monofilament b/l LE.  Vibratory sensation intact b/l LE.   Musculoskeletal Examination: Muscle strength 5/5 to all LE muscle groups b/l. Muscle strength 5/5 to all lower extremity muscle groups bilaterally. HAV with bunion deformity noted b/l LE. Assessment/Plan: 1. Porokeratosis   2. Left foot pain   3. Right foot pain     -Patient was evaluated and treated. All patient's and/or POA's questions/concerns answered on today's visit. -Continue supportive shoe gear daily. -Porokeratotic lesion(s) L hallux and submet head 5 right foot pared and enucleated with sterile currette without incident. Total number of lesions debrided=2. -Patient/POA to call should there be question/concern in the interim.   Return in about 3 months (around 05/15/2023).  Freddie Breech, DPM

## 2023-02-18 ENCOUNTER — Other Ambulatory Visit: Payer: Self-pay | Admitting: Family Medicine

## 2023-02-18 DIAGNOSIS — M25569 Pain in unspecified knee: Secondary | ICD-10-CM

## 2023-05-02 ENCOUNTER — Ambulatory Visit: Payer: Managed Care, Other (non HMO) | Admitting: Podiatry

## 2023-05-14 LAB — HM COLONOSCOPY

## 2023-05-30 ENCOUNTER — Encounter: Payer: Self-pay | Admitting: Podiatry

## 2023-05-30 ENCOUNTER — Ambulatory Visit: Payer: Managed Care, Other (non HMO) | Admitting: Podiatry

## 2023-05-30 VITALS — BP 160/90 | HR 96 | Temp 97.1°F | Resp 18 | Ht 68.0 in | Wt 215.0 lb

## 2023-05-30 DIAGNOSIS — M79674 Pain in right toe(s): Secondary | ICD-10-CM

## 2023-05-30 DIAGNOSIS — B351 Tinea unguium: Secondary | ICD-10-CM | POA: Diagnosis not present

## 2023-05-30 DIAGNOSIS — M79675 Pain in left toe(s): Secondary | ICD-10-CM | POA: Diagnosis not present

## 2023-06-15 NOTE — Progress Notes (Signed)
   Chief Complaint  Patient presents with   Nail Problem    RM8: patient here for for RFC    SUBJECTIVE Patient presents to office today complaining of elongated, thickened nails that cause pain while ambulating in shoes.  Patient is unable to trim their own nails. Patient is here for further evaluation and treatment.  Past Medical History:  Diagnosis Date   Erectile dysfunction    Gout    Hypercholesteremia    Hypertension    Insomnia    Prediabetes    Vitamin D deficiency     Allergies  Allergen Reactions   Penicillins Hives and Other (See Comments)    Did it involve swelling of the face/tongue/throat, SOB, or low BP? Unknown Did it involve sudden or severe rash/hives, skin peeling, or any reaction on the inside of your mouth or nose? Unknown Did you need to seek medical attention at a hospital or doctor's office? Unknown When did it last happen?      Childhood allergy If all above answers are "NO", may proceed with cephalosporin use.    Sildenafil Nausea And Vomiting and Other (See Comments)    Possible cause of GI upset     OBJECTIVE General Patient is awake, alert, and oriented x 3 and in no acute distress. Derm Skin is dry and supple bilateral. Negative open lesions or macerations. Remaining integument unremarkable. Nails are tender, long, thickened and dystrophic with subungual debris, consistent with onychomycosis, 1-5 bilateral. No signs of infection noted. Vasc  DP and PT pedal pulses palpable bilaterally. Temperature gradient within normal limits.  Neuro Epicritic and protective threshold sensation grossly intact bilaterally.  Musculoskeletal Exam No symptomatic pedal deformities noted bilateral. Muscular strength within normal limits.  ASSESSMENT 1.  Pain due to onychomycosis of toenails both  PLAN OF CARE 1. Patient evaluated today.  2. Instructed to maintain good pedal hygiene and foot care.  3. Mechanical debridement of nails 1-5 bilaterally performed  using a nail nipper. Filed with dremel without incident.  4. Return to clinic in 3 mos.    Felecia Shelling, DPM [facility]  Dr. Felecia Shelling, DPM    [address] [place], [place] [address]                Office [phone]  Fax [phone]

## 2023-07-06 ENCOUNTER — Other Ambulatory Visit: Payer: Self-pay | Admitting: *Deleted

## 2023-07-06 MED ORDER — PRAVASTATIN SODIUM 10 MG PO TABS: 10.0000 mg | ORAL_TABLET | Freq: Every day | ORAL | 1 refills | Status: DC

## 2023-07-06 MED ORDER — AMLODIPINE BESYLATE 10 MG PO TABS: 10.0000 mg | ORAL_TABLET | Freq: Every day | ORAL | 1 refills | Status: DC

## 2023-07-06 MED ORDER — LISINOPRIL-HYDROCHLOROTHIAZIDE 20-12.5 MG PO TABS: 1.0000 | ORAL_TABLET | Freq: Every day | ORAL | 1 refills | Status: DC

## 2023-07-06 NOTE — Telephone Encounter (Signed)
Received fax requesting Rx be sent to Mail order [facility] (3 month Rx).  CPE scheduled 11/16/23 Last filled on 02/01/23 #30 tabs/ 1 refill

## 2023-07-06 NOTE — Addendum Note (Signed)
Addended by: Shon Millet on: 07/06/2023 11:38 AM   Modules accepted: Orders

## 2023-07-09 ENCOUNTER — Other Ambulatory Visit: Payer: Self-pay

## 2023-07-09 MED ORDER — COLCHICINE 0.6 MG PO TABS: 0.6000 mg | ORAL_TABLET | Freq: Every day | ORAL | 0 refills | Status: DC | PRN

## 2023-07-11 NOTE — Telephone Encounter (Signed)
Noted. Thanks.

## 2023-08-06 ENCOUNTER — Telehealth: Payer: Self-pay

## 2023-08-06 NOTE — Telephone Encounter (Signed)
Noted. Thanks.

## 2023-08-06 NOTE — Telephone Encounter (Signed)
I spoke with pt; pt spoke with access nurse on 08/03/23.  Pt symptoms of runny nose with yellow mucus,s/t began on 07/29/23. Pt tested + covid on 08/01/23 at home. Pt was not seen;; 08/06/23 pt has no S.T and only blows nose couple of times a day now with clear mucus. No fever and no cough. Pt feeling a lot better today. Pt was advised to stay hydrated and pt will cb if neededf with UC & ED precautions given and pt voiced understanding,.sending FYI to Dr Para March.

## 2023-08-23 NOTE — Telephone Encounter (Signed)
Spoke with patient to see if he is having and chest pains or shortness of breath patient advised no. Attempted to reach out to Dr. Jodi Geralds office at [phone] to advise of Dr. Armanda Heritage message. Left message for Darel Hong the surgery scheduler to return my call.

## 2023-08-23 NOTE — Telephone Encounter (Signed)
Patient last seen 02/06/23 please advise  Copied from CRM [id number]. Topic: Clinical - Medical Advice >> Aug 23, 2023  9:30 AM Larwance Sachs wrote: Reason for CRM: Patient called in regarding referral for knee surgery, patient stated ortho doctor is wanting to confirm if patient needs to be seen or scheduled with pcp for clearance before surgery  can be scheduled. Please follow up at [phone]

## 2023-08-23 NOTE — Telephone Encounter (Signed)
Please check with orthopedics.  If they are requiring preop evaluation, then please schedule.  If patient has any exertional chest pain or shortness of breath or concerns, then please schedule preop evaluation.  If Ortho was not requiring any preop evaluation and if he has no exertional chest pain or shortness of breath or other concerns then I think it makes sense to proceed.  He is younger than 76, he is not diabetic and has no known coronary disease.  Thanks.

## 2023-08-24 NOTE — Telephone Encounter (Signed)
Noted. Thanks.

## 2023-08-24 NOTE — Telephone Encounter (Signed)
Spoke with Darel Hong at Dr. Luiz Blare office they faxed over a clearance form. Once received I will place on Dr. Armanda Heritage desk to fill out and return.

## 2023-09-11 ENCOUNTER — Encounter: Payer: Self-pay | Admitting: Podiatry

## 2023-09-11 ENCOUNTER — Ambulatory Visit: Payer: Managed Care, Other (non HMO) | Admitting: Podiatry

## 2023-09-11 DIAGNOSIS — M79671 Pain in right foot: Secondary | ICD-10-CM

## 2023-09-11 DIAGNOSIS — M79675 Pain in left toe(s): Secondary | ICD-10-CM

## 2023-09-11 DIAGNOSIS — Q828 Other specified congenital malformations of skin: Secondary | ICD-10-CM | POA: Diagnosis not present

## 2023-09-11 DIAGNOSIS — L84 Corns and callosities: Secondary | ICD-10-CM | POA: Diagnosis not present

## 2023-09-11 DIAGNOSIS — M79672 Pain in left foot: Secondary | ICD-10-CM | POA: Diagnosis not present

## 2023-09-11 DIAGNOSIS — M79674 Pain in right toe(s): Secondary | ICD-10-CM | POA: Diagnosis not present

## 2023-09-11 DIAGNOSIS — B351 Tinea unguium: Secondary | ICD-10-CM

## 2023-09-11 NOTE — Progress Notes (Signed)
 Subjective:  Patient ID: Raymond Willis, male    DOB: [birth year],  MRN: [id number]  Raymond Willis presents to clinic today for: callus(es) of both feet and painful thick toenails that are difficult to trim. Painful toenails interfere with ambulation. Aggravating factors include wearing enclosed shoe gear. Pain is relieved with periodic professional debridement. Painful calluses are aggravated when weightbearing with and without shoegear. Pain is relieved with periodic professional debridement.  Chief Complaint  Patient presents with   Nail Problem    Patient last seen his PCP about 5 months ago. Patient has very little Callouses     PCP is Cleatus Arlyss RAMAN, MD.  Allergies  Allergen Reactions   Penicillins Hives and Other (See Comments)    Did it involve swelling of the face/tongue/throat, SOB, or low BP? Unknown Did it involve sudden or severe rash/hives, skin peeling, or any reaction on the inside of your mouth or nose? Unknown Did you need to seek medical attention at a hospital or doctor's office? Unknown When did it last happen?      Childhood allergy If all above answers are NO, may proceed with cephalosporin use.    Sildenafil  Nausea And Vomiting and Other (See Comments)    Possible cause of GI upset    Review of Systems: Negative except as noted in the HPI.  Objective: No changes noted in today's physical examination. There were no vitals filed for this visit.  Raymond Willis is a pleasant 63 y.o. male in NAD. AAO x 3.  Vascular Examination: Capillary refill time <3 seconds b/l LE. Palpable pedal pulses b/l LE. Digital hair present b/l. No pedal edema b/l. Skin temperature gradient WNL b/l. No varicosities b/l. No cyanosis or clubbing noted b/l LE.SABRA  Dermatological Examination: Pedal skin with normal turgor, texture and tone b/l. No open wounds. No interdigital macerations b/l. Toenails 1-5 b/l thickened, discolored, dystrophic with subungual debris. There is pain on  palpation to dorsal aspect of nailplates. Hyperkeratotic lesion(s) medial IPJ of left great toe.  No erythema, no edema, no drainage, no fluctuance. Porokeratotic lesion(s) submet head 5 left foot. No erythema, no edema, no drainage, no fluctuance..  Neurological Examination: Protective sensation intact with 10 gram monofilament b/l LE. Vibratory sensation intact b/l LE.   Musculoskeletal Examination: Muscle strength 5/5 to all lower extremity muscle groups bilaterally. HAV with bunion deformity noted b/l LE.     Latest Ref Rng & Units 02/12/2023    8:56 AM 11/10/2022    4:35 PM  Hemoglobin A1C  Hemoglobin-A1c 4.6 - 6.5 % 6.3  6.3    Assessment/Plan: 1. Pain due to onychomycosis of toenails of both feet   2. Porokeratosis   3. Callus   4. Left foot pain   5. Right foot pain     -Patient was evaluated today. All questions/concerns addressed on today's visit. -Continue supportive shoe gear daily. -Toenails 1-5 b/l were debrided in length and girth with sterile nail nippers and dremel without iatrogenic bleeding.  -Callus(es) left great toe pared utilizing sterile scalpel blade without complication or incident. Total number debrided =1. -Porokeratotic lesion(s) submet head 5 left foot pared and enucleated with sterile currette without incident. Total number of lesions debrided=1. -Patient/POA to call should there be question/concern in the interim.   Return in about 3 months (around 12/10/2023).  Raymond Willis, DPM      [facility] LOCATION: [address]  [place], [place] [address]                   Office [phone]   [facility] LOCATION: [address] [place], [place] [address] Office [phone]

## 2023-10-15 ENCOUNTER — Encounter: Payer: Managed Care, Other (non HMO) | Admitting: Family Medicine

## 2023-10-16 ENCOUNTER — Encounter: Payer: Self-pay | Admitting: Family Medicine

## 2023-10-16 ENCOUNTER — Ambulatory Visit (INDEPENDENT_AMBULATORY_CARE_PROVIDER_SITE_OTHER): Payer: Managed Care, Other (non HMO) | Admitting: Family Medicine

## 2023-10-16 VITALS — BP 140/90 | HR 67 | Temp 98.5°F | Ht 68.7 in | Wt 225.8 lb

## 2023-10-16 DIAGNOSIS — I10 Essential (primary) hypertension: Secondary | ICD-10-CM | POA: Diagnosis not present

## 2023-10-16 DIAGNOSIS — Z7189 Other specified counseling: Secondary | ICD-10-CM

## 2023-10-16 DIAGNOSIS — M1000 Idiopathic gout, unspecified site: Secondary | ICD-10-CM | POA: Diagnosis not present

## 2023-10-16 DIAGNOSIS — Z125 Encounter for screening for malignant neoplasm of prostate: Secondary | ICD-10-CM

## 2023-10-16 DIAGNOSIS — E785 Hyperlipidemia, unspecified: Secondary | ICD-10-CM

## 2023-10-16 DIAGNOSIS — Z0000 Encounter for general adult medical examination without abnormal findings: Secondary | ICD-10-CM | POA: Diagnosis not present

## 2023-10-16 DIAGNOSIS — R739 Hyperglycemia, unspecified: Secondary | ICD-10-CM | POA: Diagnosis not present

## 2023-10-16 DIAGNOSIS — M25569 Pain in unspecified knee: Secondary | ICD-10-CM

## 2023-10-16 MED ORDER — LISINOPRIL-HYDROCHLOROTHIAZIDE 20-12.5 MG PO TABS: 1.0000 | ORAL_TABLET | Freq: Every day | ORAL | 3 refills | Status: AC

## 2023-10-16 MED ORDER — COLCHICINE 0.6 MG PO TABS: 0.6000 mg | ORAL_TABLET | Freq: Every day | ORAL | 1 refills | Status: AC | PRN

## 2023-10-16 MED ORDER — PRAVASTATIN SODIUM 10 MG PO TABS: 10.0000 mg | ORAL_TABLET | Freq: Every day | ORAL | 3 refills | Status: AC

## 2023-10-16 MED ORDER — AMLODIPINE BESYLATE 10 MG PO TABS
10.0000 mg | ORAL_TABLET | Freq: Every day | ORAL | 3 refills | Status: AC
Start: 2023-10-16 — End: ?

## 2023-10-16 NOTE — Progress Notes (Unsigned)
CPE- See plan.  Routine anticipatory guidance given to patient.  See health maintenance.  The possibility exists that previously documented standard health maintenance information may have been brought forward from a previous encounter into this note.  If needed, that same information has been updated to reflect the current situation based on today's encounter.    Plan for LEFT knee surgery.  Limping from pain.  Had prev injection but still with sig pain after injection wore off.    Tetanus 2017 Flu encouraged.  PNA not due.  Shingles done 2022 covid vaccine 2021 PSA pending 2025 Colonoscopy done 2014 with [facility].   Living will d/w pt.  Wife designated if patient were incapacitated.   Diet and exercise d/w pt.    Elevated Cholesterol: Using medications without problems: he has missed some doses due to work schedule.   Muscle aches: not likely from statin.   Diet compliance: no Exercise: no   History of mild hyperglycemia.  A1c pending.   Hypertension:               Using medication without problems or lightheadedness: yes Chest pain with exertion:no Edema:no Short of breath:no Labs pending.    1 gout flare, in the knee.  Used colchicine.  Recheck uric acid pending.    PMH and SH reviewed  Meds, vitals, and allergies reviewed.   ROS: Per HPI.  Unless specifically indicated otherwise in HPI, the patient denies:  General: fever. Eyes: acute vision changes ENT: sore throat Cardiovascular: chest pain Respiratory: SOB GI: vomiting GU: dysuria Musculoskeletal: acute back pain Derm: acute rash Neuro: acute motor dysfunction Psych: worsening mood Endocrine: polydipsia Heme: bleeding Allergy: hayfever  GEN: nad, alert and oriented HEENT: mucous membranes moist NECK: supple w/o LA CV: rrr. PULM: ctab, no inc wob ABD: soft, +bs EXT: no edema SKIN: no acute rash  EKG w/o acute changes.  D/w pt.

## 2023-10-16 NOTE — Patient Instructions (Addendum)
Go to the lab on the way out.   If you have mychart we'll likely use that to update you.    Take care.  Glad to see you. We'll update ortho.

## 2023-10-17 LAB — CBC WITH DIFFERENTIAL/PLATELET
Basophils Absolute: 0.1 10*3/uL (ref 0.0–0.1)
Basophils Relative: 1 % (ref 0.0–3.0)
Eosinophils Absolute: 0.1 10*3/uL (ref 0.0–0.7)
Eosinophils Relative: 1.1 % (ref 0.0–5.0)
HCT: 46.5 % (ref 39.0–52.0)
Hemoglobin: 15.6 g/dL (ref 13.0–17.0)
Lymphocytes Relative: 35.9 % (ref 12.0–46.0)
Lymphs Abs: 2.5 10*3/uL (ref 0.7–4.0)
MCHC: 33.5 g/dL (ref 30.0–36.0)
MCV: 84.6 fL (ref 78.0–100.0)
Monocytes Absolute: 0.7 10*3/uL (ref 0.1–1.0)
Monocytes Relative: 9.3 % (ref 3.0–12.0)
Neutro Abs: 3.7 10*3/uL (ref 1.4–7.7)
Neutrophils Relative %: 52.7 % (ref 43.0–77.0)
Platelets: 299 10*3/uL (ref 150.0–400.0)
RBC: 5.49 Mil/uL (ref 4.22–5.81)
RDW: 14.6 % (ref 11.5–15.5)
WBC: 7 10*3/uL (ref 4.0–10.5)

## 2023-10-17 LAB — URIC ACID: Uric Acid, Serum: 6.7 mg/dL (ref 4.0–7.8)

## 2023-10-17 LAB — COMPREHENSIVE METABOLIC PANEL
ALT: 15 U/L (ref 0–53)
AST: 15 U/L (ref 0–37)
Albumin: 4.4 g/dL (ref 3.5–5.2)
Alkaline Phosphatase: 87 U/L (ref 39–117)
BUN: 14 mg/dL (ref 6–23)
CO2: 29 meq/L (ref 19–32)
Calcium: 9.1 mg/dL (ref 8.4–10.5)
Chloride: 102 meq/L (ref 96–112)
Creatinine, Ser: 1.23 mg/dL (ref 0.40–1.50)
GFR: 62.73 mL/min (ref >60.00)
Glucose, Bld: 87 mg/dL (ref 70–99)
Potassium: 4 meq/L (ref 3.5–5.1)
Sodium: 140 meq/L (ref 135–145)
Total Bilirubin: 0.6 mg/dL (ref 0.2–1.2)
Total Protein: 7.2 g/dL (ref 6.0–8.3)

## 2023-10-17 LAB — LIPID PANEL
Cholesterol: 216 mg/dL — ABNORMAL HIGH (ref 0–200)
HDL: 53.9 mg/dL (ref >39.00)
LDL Cholesterol: 138 mg/dL — ABNORMAL HIGH (ref 0–99)
NonHDL: 162.02
Total CHOL/HDL Ratio: 4
Triglycerides: 122 mg/dL (ref 0.0–149.0)
VLDL: 24.4 mg/dL (ref 0.0–40.0)

## 2023-10-17 LAB — PSA: PSA: 2.71 ng/mL (ref 0.10–4.00)

## 2023-10-17 LAB — HEMOGLOBIN A1C: Hgb A1c MFr Bld: 6.6 % — ABNORMAL HIGH (ref 4.6–6.5)

## 2023-10-18 ENCOUNTER — Other Ambulatory Visit: Payer: Self-pay | Admitting: Family Medicine

## 2023-10-18 DIAGNOSIS — R739 Hyperglycemia, unspecified: Secondary | ICD-10-CM | POA: Insufficient documentation

## 2023-10-18 DIAGNOSIS — Z8639 Personal history of other endocrine, nutritional and metabolic disease: Secondary | ICD-10-CM | POA: Insufficient documentation

## 2023-10-18 DIAGNOSIS — E119 Type 2 diabetes mellitus without complications: Secondary | ICD-10-CM | POA: Insufficient documentation

## 2023-10-18 NOTE — Assessment & Plan Note (Signed)
Continue lisinopril hydrochlorothiazide and amlodipine.  See notes on labs.

## 2023-10-18 NOTE — Assessment & Plan Note (Signed)
Recheck uric acid pending.  Continue prn colchicine.  No sx now.

## 2023-10-18 NOTE — Assessment & Plan Note (Signed)
Plan for LEFT knee surgery.  Limping from pain.  Had prev injection but still with sig pain after injection wore off.  Assuming labs are reasonable, then he would appear to be appropriately low risk for surgery.  D/w pt.  EKG w/o acute changes.

## 2023-10-18 NOTE — Assessment & Plan Note (Signed)
Living will d/w pt.  Wife designated if patient were incapacitated.   ?

## 2023-10-18 NOTE — Assessment & Plan Note (Signed)
History of mild hyperglycemia.  A1c pending.

## 2023-10-18 NOTE — Assessment & Plan Note (Signed)
Tetanus 2017 Flu encouraged.  PNA not due.  Shingles done 2022 covid vaccine 2021 PSA pending 2025 Colonoscopy done 2014 with [facility].   Living will d/w pt.  Wife designated if patient were incapacitated.   Diet and exercise d/w pt.

## 2023-10-18 NOTE — Assessment & Plan Note (Signed)
Continue pravastatin.  See notes on labs.

## 2023-11-16 ENCOUNTER — Encounter: Payer: Managed Care, Other (non HMO) | Admitting: Family Medicine

## 2023-11-29 ENCOUNTER — Other Ambulatory Visit: Payer: Self-pay | Admitting: Orthopedic Surgery

## 2023-12-07 NOTE — Progress Notes (Addendum)
 COVID Vaccine Completed: yes  Date of COVID positive in last 90 days: no  PCP - Crawford Givens, MD Cardiologist - n/a  Medical/cardiac clearance by Dr. Para March in media tab dated 10/23/23   Chest x-ray - 12/10/23 Epic EKG - 10/16/23 Epic Stress Test - n/a ECHO - n/a Cardiac Cath - n/a Pacemaker/ICD device last checked: n/a Spinal Cord Stimulator: n/a  Bowel Prep - no  Sleep Study - n/a CPAP -   Fasting Blood Sugar - preDM, no meds or checks at home per pt Checks Blood Sugar _____ times a day  Last dose of GLP1 agonist-  N/A GLP1 instructions:  Hold 7 days before surgery    Last dose of SGLT-2 inhibitors-  N/A SGLT-2 instructions:  Hold 3 days before surgery    Blood Thinner Instructions:  Last dose: n/a  Time: Aspirin Instructions: Last Dose:  Activity level: Can go up a flight of stairs and perform activities of daily living without stopping and without symptoms of chest pain or shortness of breath.  Anesthesia review:   Patient denies shortness of breath, fever, cough and chest pain at PAT appointment  Patient verbalized understanding of instructions that were given to them at the PAT appointment. Patient was also instructed that they will need to review over the PAT instructions again at home before surgery.

## 2023-12-07 NOTE — Patient Instructions (Addendum)
 SURGICAL WAITING ROOM VISITATION  Patients having surgery or a procedure may have no more than 2 support people in the waiting area - these visitors may rotate.    Children under the age of 22 must have an adult with them who is not the patient.  Due to an increase in RSV and influenza rates and associated hospitalizations, children ages 37 and under may not visit patients in [facility] hospitals.  Visitors with respiratory illnesses are discouraged from visiting and should remain at home.  If the patient needs to stay at the hospital during part of their recovery, the visitor guidelines for inpatient rooms apply. Pre-op nurse will coordinate an appropriate time for 1 support person to accompany patient in pre-op.  This support person may not rotate.    Please refer to the [facility] website for the visitor guidelines for Inpatients (after your surgery is over and you are in a regular room).    Your procedure is scheduled on: 12/19/23   Report to [facility] Main Entrance    Report to admitting at 7:45 AM   Call this number if you have problems the morning of surgery [phone]   Do not eat food :After Midnight.   After Midnight you may have the following liquids until 7:15 AM DAY OF SURGERY  Water Non-Citrus Juices (without pulp, NO RED-Apple, White grape, White cranberry) Black Coffee (NO MILK/CREAM OR CREAMERS, sugar ok)  Clear Tea (NO MILK/CREAM OR CREAMERS, sugar ok) regular and decaf                             Plain Jell-O (NO RED)                                           Fruit ices (not with fruit pulp, NO RED)                                     Popsicles (NO RED)                                                               Sports drinks like Gatorade (NO RED)                 The day of surgery:  Drink ONE (1) Pre-Surgery G2 at 7:15 AM the morning of surgery. Drink in one sitting. Do not sip.  This drink was given to you during your hospital  pre-op  appointment visit. Nothing else to drink after completing the  Pre-Surgery G2.          If you have questions, please contact your surgeon's office.   FOLLOW BOWEL PREP AND ANY ADDITIONAL PRE OP INSTRUCTIONS YOU RECEIVED FROM YOUR SURGEON'S OFFICE!!!     Oral Hygiene is also important to reduce your risk of infection.                                    Remember - BRUSH YOUR  TEETH THE MORNING OF SURGERY WITH YOUR REGULAR TOOTHPASTE  DENTURES WILL BE REMOVED PRIOR TO SURGERY PLEASE DO NOT APPLY "Poly grip" OR ADHESIVES!!!   Stop all vitamins and herbal supplements 7 days before surgery.   Take these medicines the morning of surgery with A SIP OF WATER: Amlodipine, Pravastatin                               You may not have any metal on your body including jewelry, and body piercing             Do not wear lotions, powders, cologne, or deodorant              Men may shave face and neck.   Do not bring valuables to the hospital. [facility] IS NOT             RESPONSIBLE   FOR VALUABLES.   Contacts, glasses, dentures or bridgework may not be worn into surgery.  DO NOT BRING YOUR HOME MEDICATIONS TO THE HOSPITAL. PHARMACY WILL DISPENSE MEDICATIONS LISTED ON YOUR MEDICATION LIST TO YOU DURING YOUR ADMISSION IN THE HOSPITAL!    Patients discharged on the day of surgery will not be allowed to drive home.  Someone NEEDS to stay with you for the first 24 hours after anesthesia.   Special Instructions: Bring a copy of your healthcare power of attorney and living will documents the day of surgery if you haven't scanned them before.              Please read over the following fact sheets you were given: IF YOU HAVE QUESTIONS ABOUT YOUR PRE-OP INSTRUCTIONS PLEASE CALL [name]    If you received a COVID test during your pre-op visit  it is requested that you wear a mask when out in public, stay away from anyone that may not be feeling well and notify your surgeon if you develop  symptoms. If you test positive for Covid or have been in contact with anyone that has tested positive in the last 10 days please notify you surgeon.      Pre-operative 5 CHG Bath Instructions   You can play a key role in reducing the risk of infection after surgery. Your skin needs to be as free of germs as possible. You can reduce the number of germs on your skin by washing with CHG (chlorhexidine gluconate) soap before surgery. CHG is an antiseptic soap that kills germs and continues to kill germs even after washing.   DO NOT use if you have an allergy to chlorhexidine/CHG or antibacterial soaps. If your skin becomes reddened or irritated, stop using the CHG and notify one of our RNs at [phone].   Please shower with the CHG soap starting 4 days before surgery using the following schedule:     Please keep in mind the following:  DO NOT shave, including legs and underarms, starting the day of your first shower.   You may shave your face at any point before/day of surgery.  Place clean sheets on your bed the day you start using CHG soap. Use a clean washcloth (not used since being washed) for each shower. DO NOT sleep with pets once you start using the CHG.   CHG Shower Instructions:  If you choose to wash your hair and private area, wash first with your normal shampoo/soap.  After you use shampoo/soap, rinse your hair and body  thoroughly to remove shampoo/soap residue.  Turn the water OFF and apply about 3 tablespoons (45 ml) of CHG soap to a CLEAN washcloth.  Apply CHG soap ONLY FROM YOUR NECK DOWN TO YOUR TOES (washing for 3-5 minutes)  DO NOT use CHG soap on face, private areas, open wounds, or sores.  Pay special attention to the area where your surgery is being performed.  If you are having back surgery, having someone wash your back for you may be helpful. Wait 2 minutes after CHG soap is applied, then you may rinse off the CHG soap.  Pat dry with a clean towel  Put on clean  clothes/pajamas   If you choose to wear lotion, please use ONLY the CHG-compatible lotions on the back of this paper.     Additional instructions for the day of surgery: DO NOT APPLY any lotions, deodorants, cologne, or perfumes.   Put on clean/comfortable clothes.  Brush your teeth.  Ask your nurse before applying any prescription medications to the skin.      CHG Compatible Lotions   Aveeno Moisturizing lotion  Cetaphil Moisturizing Cream  Cetaphil Moisturizing Lotion  Clairol Herbal Essence Moisturizing Lotion, Dry Skin  Clairol Herbal Essence Moisturizing Lotion, Extra Dry Skin  Clairol Herbal Essence Moisturizing Lotion, Normal Skin  Curel Age Defying Therapeutic Moisturizing Lotion with Alpha Hydroxy  Curel Extreme Care Body Lotion  Curel Soothing Hands Moisturizing Hand Lotion  Curel Therapeutic Moisturizing Cream, Fragrance-Free  Curel Therapeutic Moisturizing Lotion, Fragrance-Free  Curel Therapeutic Moisturizing Lotion, Original Formula  Eucerin Daily Replenishing Lotion  Eucerin Dry Skin Therapy Plus Alpha Hydroxy Crme  Eucerin Dry Skin Therapy Plus Alpha Hydroxy Lotion  Eucerin Original Crme  Eucerin Original Lotion  Eucerin Plus Crme Eucerin Plus Lotion  Eucerin TriLipid Replenishing Lotion  Keri Anti-Bacterial Hand Lotion  Keri Deep Conditioning Original Lotion Dry Skin Formula Softly Scented  Keri Deep Conditioning Original Lotion, Fragrance Free Sensitive Skin Formula  Keri Lotion Fast Absorbing Fragrance Free Sensitive Skin Formula  Keri Lotion Fast Absorbing Softly Scented Dry Skin Formula  Keri Original Lotion  Keri Skin Renewal Lotion Keri Silky Smooth Lotion  Keri Silky Smooth Sensitive Skin Lotion  Nivea Body Creamy Conditioning Oil  Nivea Body Extra Enriched Lotion  Nivea Body Original Lotion  Nivea Body Sheer Moisturizing Lotion Nivea Crme  Nivea Skin Firming Lotion  NutraDerm 30 Skin Lotion  NutraDerm Skin Lotion  NutraDerm Therapeutic  Skin Cream  NutraDerm Therapeutic Skin Lotion  ProShield Protective Hand Cream  Provon moisturizing lotion   Incentive Spirometer  An incentive spirometer is a tool that can help keep your lungs clear and active. This tool measures how well you are filling your lungs with each breath. Taking long deep breaths may help reverse or decrease the chance of developing breathing (pulmonary) problems (especially infection) following: A long period of time when you are unable to move or be active. BEFORE THE PROCEDURE  If the spirometer includes an indicator to show your best effort, your nurse or respiratory therapist will set it to a desired goal. If possible, sit up straight or lean slightly forward. Try not to slouch. Hold the incentive spirometer in an upright position. INSTRUCTIONS FOR USE  Sit on the edge of your bed if possible, or sit up as far as you can in bed or on a chair. Hold the incentive spirometer in an upright position. Breathe out normally. Place the mouthpiece in your mouth and seal your lips tightly around it. Breathe in slowly  and as deeply as possible, raising the piston or the ball toward the top of the column. Hold your breath for 3-5 seconds or for as long as possible. Allow the piston or ball to fall to the bottom of the column. Remove the mouthpiece from your mouth and breathe out normally. Rest for a few seconds and repeat Steps 1 through 7 at least 10 times every 1-2 hours when you are awake. Take your time and take a few normal breaths between deep breaths. The spirometer may include an indicator to show your best effort. Use the indicator as a goal to work toward during each repetition. After each set of 10 deep breaths, practice coughing to be sure your lungs are clear. If you have an incision (the cut made at the time of surgery), support your incision when coughing by placing a pillow or rolled up towels firmly against it. Once you are able to get out of bed, walk  around indoors and cough well. You may stop using the incentive spirometer when instructed by your caregiver.  RISKS AND COMPLICATIONS Take your time so you do not get dizzy or light-headed. If you are in pain, you may need to take or ask for pain medication before doing incentive spirometry. It is harder to take a deep breath if you are having pain. AFTER USE Rest and breathe slowly and easily. It can be helpful to keep track of a log of your progress. Your caregiver can provide you with a simple table to help with this. If you are using the spirometer at home, follow these instructions: SEEK MEDICAL CARE IF:  You are having difficultly using the spirometer. You have trouble using the spirometer as often as instructed. Your pain medication is not giving enough relief while using the spirometer. You develop fever of 100.5 F (38.1 C) or higher. SEEK IMMEDIATE MEDICAL CARE IF:  You cough up bloody sputum that had not been present before. You develop fever of 102 F (38.9 C) or greater. You develop worsening pain at or near the incision site. MAKE SURE YOU:  Understand these instructions. Will watch your condition. Will get help right away if you are not doing well or get worse. Document Released: 01/01/2007 Document Revised: 11/13/2011 Document Reviewed: 03/04/2007 [facility] Patient Information 2014 [place], [place].   ________________________________________________________________________

## 2023-12-10 ENCOUNTER — Other Ambulatory Visit: Payer: Self-pay

## 2023-12-10 ENCOUNTER — Encounter (HOSPITAL_COMMUNITY): Payer: Self-pay

## 2023-12-10 ENCOUNTER — Encounter (HOSPITAL_COMMUNITY)
Admission: RE | Admit: 2023-12-10 | Discharge: 2023-12-10 | Disposition: A | Discharge status: Home / Self Care | Source: Ambulatory Visit | Attending: Orthopedic Surgery | Admitting: Orthopedic Surgery

## 2023-12-10 ENCOUNTER — Ambulatory Visit (HOSPITAL_COMMUNITY)
Admission: RE | Admit: 2023-12-10 | Discharge: 2023-12-10 | Disposition: A | Discharge status: Home / Self Care | Source: Ambulatory Visit | Attending: Orthopedic Surgery | Admitting: Orthopedic Surgery

## 2023-12-10 VITALS — BP 135/97 | HR 70 | Temp 98.3°F | Resp 16 | Ht 68.0 in | Wt 216.0 lb

## 2023-12-10 DIAGNOSIS — Z01818 Encounter for other preprocedural examination: Secondary | ICD-10-CM | POA: Diagnosis present

## 2023-12-10 DIAGNOSIS — I10 Essential (primary) hypertension: Secondary | ICD-10-CM

## 2023-12-10 HISTORY — DX: Unspecified osteoarthritis, unspecified site: M19.90

## 2023-12-10 LAB — BASIC METABOLIC PANEL WITH GFR
Anion gap: 11 (ref 5–15)
BUN: 18 mg/dL (ref 8–23)
CO2: 23 mmol/L (ref 22–32)
Calcium: 9.1 mg/dL (ref 8.9–10.3)
Chloride: 106 mmol/L (ref 98–111)
Creatinine, Ser: 1.13 mg/dL (ref 0.61–1.24)
GFR, Estimated: >60 mL/min (ref >60)
Glucose, Bld: 100 mg/dL — ABNORMAL HIGH (ref 70–99)
Potassium: 3.7 mmol/L (ref 3.5–5.1)
Sodium: 140 mmol/L (ref 135–145)

## 2023-12-10 LAB — SURGICAL PCR SCREEN
MRSA, PCR: NEGATIVE
Staphylococcus aureus: NEGATIVE

## 2023-12-10 LAB — CBC
HCT: 47.1 % (ref 39.0–52.0)
Hemoglobin: 15.5 g/dL (ref 13.0–17.0)
MCH: 28.1 pg (ref 26.0–34.0)
MCHC: 32.9 g/dL (ref 30.0–36.0)
MCV: 85.5 fL (ref 80.0–100.0)
Platelets: 313 10*3/uL (ref 150–400)
RBC: 5.51 MIL/uL (ref 4.22–5.81)
RDW: 13.4 % (ref 11.5–15.5)
WBC: 6 10*3/uL (ref 4.0–10.5)
nRBC: 0 % (ref 0.0–0.2)

## 2023-12-19 ENCOUNTER — Ambulatory Visit (HOSPITAL_COMMUNITY): Admitting: Registered Nurse

## 2023-12-19 ENCOUNTER — Encounter (HOSPITAL_COMMUNITY): Payer: Self-pay | Admitting: Orthopedic Surgery

## 2023-12-19 ENCOUNTER — Ambulatory Visit (HOSPITAL_BASED_OUTPATIENT_CLINIC_OR_DEPARTMENT_OTHER): Admitting: Registered Nurse

## 2023-12-19 ENCOUNTER — Other Ambulatory Visit: Payer: Self-pay

## 2023-12-19 ENCOUNTER — Observation Stay (HOSPITAL_COMMUNITY)
Admission: RE | Admit: 2023-12-19 | Discharge: 2023-12-20 | Disposition: A | Discharge status: Home / Self Care | Source: Ambulatory Visit | Attending: Orthopedic Surgery | Admitting: Orthopedic Surgery

## 2023-12-19 ENCOUNTER — Encounter (HOSPITAL_COMMUNITY)
Admission: RE | Disposition: A | Discharge status: Home / Self Care | Payer: Self-pay | Source: Ambulatory Visit | Attending: Orthopedic Surgery

## 2023-12-19 DIAGNOSIS — M1712 Unilateral primary osteoarthritis, left knee: Secondary | ICD-10-CM | POA: Diagnosis present

## 2023-12-19 DIAGNOSIS — E669 Obesity, unspecified: Secondary | ICD-10-CM | POA: Diagnosis not present

## 2023-12-19 DIAGNOSIS — I10 Essential (primary) hypertension: Secondary | ICD-10-CM

## 2023-12-19 DIAGNOSIS — E119 Type 2 diabetes mellitus without complications: Secondary | ICD-10-CM

## 2023-12-19 DIAGNOSIS — Z79899 Other long term (current) drug therapy: Secondary | ICD-10-CM | POA: Insufficient documentation

## 2023-12-19 HISTORY — PX: TOTAL KNEE ARTHROPLASTY: SHX125

## 2023-12-19 LAB — GLUCOSE, CAPILLARY: Glucose-Capillary: 186 mg/dL — ABNORMAL HIGH (ref 70–99)

## 2023-12-19 SURGERY — ARTHROPLASTY, KNEE, TOTAL
Anesthesia: Spinal | Site: Knee | Laterality: Left

## 2023-12-19 MED ORDER — 0.9 % SODIUM CHLORIDE (POUR BTL) OPTIME
TOPICAL | Status: DC | PRN
  Administered 2023-12-19: 1000 mL

## 2023-12-19 MED ORDER — OXYCODONE HCL 5 MG/5ML PO SOLN: 5.0000 mg | Freq: Once | ORAL | Status: DC | PRN

## 2023-12-19 MED ORDER — CELECOXIB 200 MG PO CAPS
200.0000 mg | ORAL_CAPSULE | Freq: Two times a day (BID) | ORAL | Status: DC
  Administered 2023-12-19 – 2023-12-20 (×2): 200 mg via ORAL
  Filled 2023-12-19 (×2): qty 1

## 2023-12-19 MED ORDER — MIDAZOLAM HCL 2 MG/2ML IJ SOLN
2.0000 mg | INTRAMUSCULAR | Status: DC
  Administered 2023-12-19: 2 mg via INTRAVENOUS
  Filled 2023-12-19: qty 2

## 2023-12-19 MED ORDER — DEXAMETHASONE SODIUM PHOSPHATE 10 MG/ML IJ SOLN
INTRAMUSCULAR | Status: AC
  Filled 2023-12-19: qty 1

## 2023-12-19 MED ORDER — DOCUSATE SODIUM 100 MG PO CAPS
100.0000 mg | ORAL_CAPSULE | Freq: Two times a day (BID) | ORAL | Status: DC
  Administered 2023-12-19 – 2023-12-20 (×2): 100 mg via ORAL
  Filled 2023-12-19 (×2): qty 1

## 2023-12-19 MED ORDER — DEXAMETHASONE SODIUM PHOSPHATE 10 MG/ML IJ SOLN
10.0000 mg | Freq: Two times a day (BID) | INTRAMUSCULAR | Status: AC
  Administered 2023-12-19 – 2023-12-20 (×2): 10 mg via INTRAVENOUS
  Filled 2023-12-19 (×2): qty 1

## 2023-12-19 MED ORDER — MAGNESIUM CITRATE PO SOLN: 1.0000 | Freq: Once | ORAL | Status: DC | PRN

## 2023-12-19 MED ORDER — ACETAMINOPHEN 10 MG/ML IV SOLN
INTRAVENOUS | Status: DC | PRN
Start: 2023-12-19 — End: 2023-12-19
  Administered 2023-12-19: 1000 mg via INTRAVENOUS

## 2023-12-19 MED ORDER — HYDROCHLOROTHIAZIDE 12.5 MG PO TABS
12.5000 mg | ORAL_TABLET | Freq: Every day | ORAL | Status: DC
  Administered 2023-12-20: 12.5 mg via ORAL
  Filled 2023-12-19: qty 1

## 2023-12-19 MED ORDER — SODIUM CHLORIDE 0.9% FLUSH
INTRAVENOUS | Status: DC | PRN
  Administered 2023-12-19: 50 mL

## 2023-12-19 MED ORDER — TRANEXAMIC ACID-NACL 1000-0.7 MG/100ML-% IV SOLN
1000.0000 mg | INTRAVENOUS | Status: AC
  Administered 2023-12-19: 1000 mg via INTRAVENOUS
  Filled 2023-12-19: qty 100

## 2023-12-19 MED ORDER — ONDANSETRON HCL 4 MG/2ML IJ SOLN
INTRAMUSCULAR | Status: DC | PRN
  Administered 2023-12-19: 4 mg via INTRAVENOUS

## 2023-12-19 MED ORDER — ACETAMINOPHEN 500 MG PO TABS
1000.0000 mg | ORAL_TABLET | Freq: Four times a day (QID) | ORAL | Status: AC
  Administered 2023-12-19 – 2023-12-20 (×4): 1000 mg via ORAL
  Filled 2023-12-19 (×4): qty 2

## 2023-12-19 MED ORDER — LISINOPRIL 20 MG PO TABS
20.0000 mg | ORAL_TABLET | Freq: Every day | ORAL | Status: DC
Start: 2023-12-20 — End: 2023-12-20
  Administered 2023-12-20: 20 mg via ORAL
  Filled 2023-12-19: qty 1

## 2023-12-19 MED ORDER — LACTATED RINGERS IV SOLN: INTRAVENOUS | Status: DC

## 2023-12-19 MED ORDER — ALUM & MAG HYDROXIDE-SIMETH 200-200-20 MG/5ML PO SUSP: 30.0000 mL | ORAL | Status: DC | PRN

## 2023-12-19 MED ORDER — ASPIRIN 325 MG PO TBEC
325.0000 mg | DELAYED_RELEASE_TABLET | Freq: Two times a day (BID) | ORAL | Status: DC
  Administered 2023-12-19 – 2023-12-20 (×2): 325 mg via ORAL
  Filled 2023-12-19 (×2): qty 1

## 2023-12-19 MED ORDER — ACETAMINOPHEN 10 MG/ML IV SOLN
INTRAVENOUS | Status: AC
  Filled 2023-12-19: qty 100

## 2023-12-19 MED ORDER — BUPIVACAINE-EPINEPHRINE (PF) 0.5% -1:200000 IJ SOLN
INTRAMUSCULAR | Status: AC
Start: 2023-12-19 — End: ?
  Filled 2023-12-19: qty 30

## 2023-12-19 MED ORDER — FENTANYL CITRATE PF 50 MCG/ML IJ SOSY
100.0000 ug | PREFILLED_SYRINGE | INTRAMUSCULAR | Status: DC
  Administered 2023-12-19: 100 ug via INTRAVENOUS
  Filled 2023-12-19: qty 2

## 2023-12-19 MED ORDER — POLYETHYLENE GLYCOL 3350 17 G PO PACK: 17.0000 g | PACK | Freq: Every day | ORAL | Status: DC | PRN

## 2023-12-19 MED ORDER — SODIUM CHLORIDE (PF) 0.9 % IJ SOLN
INTRAMUSCULAR | Status: AC
  Filled 2023-12-19: qty 50

## 2023-12-19 MED ORDER — DIPHENHYDRAMINE HCL 12.5 MG/5ML PO ELIX: 12.5000 mg | ORAL_SOLUTION | ORAL | Status: DC | PRN

## 2023-12-19 MED ORDER — HYDROMORPHONE HCL 1 MG/ML IJ SOLN: 0.5000 mg | INTRAMUSCULAR | Status: DC | PRN

## 2023-12-19 MED ORDER — WATER FOR IRRIGATION, STERILE IR SOLN
Status: DC | PRN
  Administered 2023-12-19: 2000 mL

## 2023-12-19 MED ORDER — ORAL CARE MOUTH RINSE: 15.0000 mL | Freq: Once | OROMUCOSAL | Status: AC

## 2023-12-19 MED ORDER — CEFAZOLIN SODIUM-DEXTROSE 2-4 GM/100ML-% IV SOLN
2.0000 g | INTRAVENOUS | Status: AC
  Administered 2023-12-19: 2 g via INTRAVENOUS
  Filled 2023-12-19: qty 100

## 2023-12-19 MED ORDER — PHENYLEPHRINE HCL-NACL 20-0.9 MG/250ML-% IV SOLN
INTRAVENOUS | Status: DC | PRN
  Administered 2023-12-19: 25 ug/min via INTRAVENOUS

## 2023-12-19 MED ORDER — OXYCODONE HCL 5 MG PO TABS
5.0000 mg | ORAL_TABLET | ORAL | Status: DC | PRN
  Administered 2023-12-19: 5 mg via ORAL
  Administered 2023-12-20: 10 mg via ORAL
  Administered 2023-12-20: 5 mg via ORAL
  Filled 2023-12-19: qty 2
  Filled 2023-12-19 (×3): qty 1

## 2023-12-19 MED ORDER — CHLORHEXIDINE GLUCONATE 0.12 % MT SOLN
15.0000 mL | Freq: Once | OROMUCOSAL | Status: AC
  Administered 2023-12-19: 15 mL via OROMUCOSAL

## 2023-12-19 MED ORDER — OXYCODONE HCL 5 MG PO TABS: 5.0000 mg | ORAL_TABLET | Freq: Once | ORAL | Status: DC | PRN

## 2023-12-19 MED ORDER — SODIUM CHLORIDE 0.9 % IV SOLN: 12.5000 mg | INTRAVENOUS | Status: DC | PRN

## 2023-12-19 MED ORDER — ROPIVACAINE HCL 5 MG/ML IJ SOLN
INTRAMUSCULAR | Status: DC | PRN
  Administered 2023-12-19: 20 mL via PERINEURAL

## 2023-12-19 MED ORDER — BUPIVACAINE IN DEXTROSE 0.75-8.25 % IT SOLN
INTRATHECAL | Status: DC | PRN
  Administered 2023-12-19: 1.8 mL via INTRATHECAL

## 2023-12-19 MED ORDER — MAGNESIUM OXIDE -MG SUPPLEMENT 400 (240 MG) MG PO TABS
400.0000 mg | ORAL_TABLET | Freq: Every day | ORAL | Status: DC
  Administered 2023-12-20: 400 mg via ORAL
  Filled 2023-12-19: qty 1

## 2023-12-19 MED ORDER — AMLODIPINE BESYLATE 10 MG PO TABS
10.0000 mg | ORAL_TABLET | Freq: Every day | ORAL | Status: DC
  Administered 2023-12-19 – 2023-12-20 (×2): 10 mg via ORAL
  Filled 2023-12-19 (×2): qty 1

## 2023-12-19 MED ORDER — TRANEXAMIC ACID-NACL 1000-0.7 MG/100ML-% IV SOLN
1000.0000 mg | Freq: Once | INTRAVENOUS | Status: AC
  Administered 2023-12-19: 1000 mg via INTRAVENOUS
  Filled 2023-12-19: qty 100

## 2023-12-19 MED ORDER — BUPIVACAINE-EPINEPHRINE 0.5% -1:200000 IJ SOLN
INTRAMUSCULAR | Status: DC | PRN
  Administered 2023-12-19: 30 mL

## 2023-12-19 MED ORDER — CEFAZOLIN SODIUM-DEXTROSE 2-4 GM/100ML-% IV SOLN
2.0000 g | Freq: Four times a day (QID) | INTRAVENOUS | Status: AC
  Administered 2023-12-19 – 2023-12-20 (×2): 2 g via INTRAVENOUS
  Filled 2023-12-19 (×2): qty 100

## 2023-12-19 MED ORDER — BUPIVACAINE LIPOSOME 1.3 % IJ SUSP
INTRAMUSCULAR | Status: AC
  Filled 2023-12-19: qty 20

## 2023-12-19 MED ORDER — BUPIVACAINE LIPOSOME 1.3 % IJ SUSP: 20.0000 mL | Freq: Once | INTRAMUSCULAR | Status: DC

## 2023-12-19 MED ORDER — BUPIVACAINE LIPOSOME 1.3 % IJ SUSP
INTRAMUSCULAR | Status: DC | PRN
  Administered 2023-12-19: 20 mL

## 2023-12-19 MED ORDER — ONDANSETRON HCL 4 MG/2ML IJ SOLN
INTRAMUSCULAR | Status: AC
Start: 2023-12-19 — End: ?
  Filled 2023-12-19: qty 2

## 2023-12-19 MED ORDER — LISINOPRIL-HYDROCHLOROTHIAZIDE 20-12.5 MG PO TABS: 1.0000 | ORAL_TABLET | Freq: Every day | ORAL | Status: DC

## 2023-12-19 MED ORDER — METHOCARBAMOL 500 MG PO TABS
500.0000 mg | ORAL_TABLET | Freq: Four times a day (QID) | ORAL | Status: DC | PRN
  Administered 2023-12-20: 500 mg via ORAL
  Filled 2023-12-19: qty 1

## 2023-12-19 MED ORDER — PROPOFOL 500 MG/50ML IV EMUL
INTRAVENOUS | Status: DC | PRN
  Administered 2023-12-19: 40 ug/kg/min via INTRAVENOUS

## 2023-12-19 MED ORDER — BISACODYL 5 MG PO TBEC: 5.0000 mg | DELAYED_RELEASE_TABLET | Freq: Every day | ORAL | Status: DC | PRN

## 2023-12-19 MED ORDER — ONDANSETRON HCL 4 MG PO TABS: 4.0000 mg | ORAL_TABLET | Freq: Four times a day (QID) | ORAL | Status: DC | PRN

## 2023-12-19 MED ORDER — OXYCODONE-ACETAMINOPHEN 5-325 MG PO TABS: 1.0000 | ORAL_TABLET | ORAL | 0 refills | Status: DC | PRN

## 2023-12-19 MED ORDER — SODIUM CHLORIDE 0.9 % IV SOLN: INTRAVENOUS | Status: DC

## 2023-12-19 MED ORDER — SODIUM CHLORIDE 0.9 % IR SOLN
Status: DC | PRN
  Administered 2023-12-19: 1000 mL

## 2023-12-19 MED ORDER — POVIDONE-IODINE 10 % EX SWAB
2.0000 | Freq: Once | CUTANEOUS | Status: DC
Start: 2023-12-19 — End: 2023-12-19

## 2023-12-19 MED ORDER — HYDROMORPHONE HCL 1 MG/ML IJ SOLN: 0.2500 mg | INTRAMUSCULAR | Status: DC | PRN

## 2023-12-19 MED ORDER — METHOCARBAMOL 1000 MG/10ML IJ SOLN: 500.0000 mg | Freq: Four times a day (QID) | INTRAMUSCULAR | Status: DC | PRN

## 2023-12-19 MED ORDER — ASPIRIN 325 MG PO TBEC: 325.0000 mg | DELAYED_RELEASE_TABLET | Freq: Two times a day (BID) | ORAL | 0 refills | Status: DC

## 2023-12-19 MED ORDER — DOCUSATE SODIUM 100 MG PO CAPS: 100.0000 mg | ORAL_CAPSULE | Freq: Two times a day (BID) | ORAL | 0 refills | Status: AC

## 2023-12-19 MED ORDER — PROPOFOL 1000 MG/100ML IV EMUL
INTRAVENOUS | Status: AC
  Filled 2023-12-19: qty 100

## 2023-12-19 MED ORDER — DEXAMETHASONE SODIUM PHOSPHATE 10 MG/ML IJ SOLN
INTRAMUSCULAR | Status: DC | PRN
  Administered 2023-12-19: 6 mg via INTRAVENOUS

## 2023-12-19 MED ORDER — ONDANSETRON HCL 4 MG/2ML IJ SOLN: 4.0000 mg | Freq: Four times a day (QID) | INTRAMUSCULAR | Status: DC | PRN

## 2023-12-19 SURGICAL SUPPLY — 49 items
ATTUNE MED DOME PAT 41 KNEE (Knees) IMPLANT
ATTUNE PS FEM LT SZ 7 CEM KNEE (Femur) IMPLANT
ATTUNE PSRP INSR SZ7 8 KNEE (Insert) IMPLANT
BAG COUNTER SPONGE SURGICOUNT (BAG) IMPLANT
BAG ZIPLOCK 12X15 (MISCELLANEOUS) ×1 IMPLANT
BASE TIBIAL ROT PLAT SZ 8 KNEE (Knees) IMPLANT
BENZOIN TINCTURE PRP APPL 2/3 (GAUZE/BANDAGES/DRESSINGS) IMPLANT
BLADE SAGITTAL 25.0X1.19X90 (BLADE) ×1 IMPLANT
BLADE SAW SGTL 13.0X1.19X90.0M (BLADE) ×1 IMPLANT
BNDG ELASTIC 6INX 5YD STR LF (GAUZE/BANDAGES/DRESSINGS) ×1 IMPLANT
BNDG ELASTIC 6X15 VLCR STRL LF (GAUZE/BANDAGES/DRESSINGS) IMPLANT
BOOTIES KNEE HIGH SLOAN (MISCELLANEOUS) ×1 IMPLANT
BOWL SMART MIX CTS (DISPOSABLE) ×1 IMPLANT
CEMENT HV SMART SET (Cement) ×2 IMPLANT
CLSR STERI-STRIP ANTIMIC 1/2X4 (GAUZE/BANDAGES/DRESSINGS) IMPLANT
COVER SURGICAL LIGHT HANDLE (MISCELLANEOUS) ×1 IMPLANT
CUFF TRNQT CYL 34X4.125X (TOURNIQUET CUFF) ×1 IMPLANT
DRAPE INCISE IOBAN 66X45 STRL (DRAPES) ×1 IMPLANT
DRAPE U-SHAPE 47X51 STRL (DRAPES) ×1 IMPLANT
DRSG AQUACEL AG ADV 3.5X10 (GAUZE/BANDAGES/DRESSINGS) ×1 IMPLANT
DURAPREP 26ML APPLICATOR (WOUND CARE) ×1 IMPLANT
ELECT PENCIL ROCKER SW 15FT (MISCELLANEOUS) ×1 IMPLANT
ELECT REM PT RETURN 15FT ADLT (MISCELLANEOUS) ×1 IMPLANT
GLOVE BIOGEL PI IND STRL 8 (GLOVE) ×2 IMPLANT
GLOVE ECLIPSE 7.5 STRL STRAW (GLOVE) ×2 IMPLANT
GOWN STRL REUS W/ TWL XL LVL3 (GOWN DISPOSABLE) ×2 IMPLANT
HOLDER FOLEY CATH W/STRAP (MISCELLANEOUS) IMPLANT
HOOD PEEL AWAY T7 (MISCELLANEOUS) ×3 IMPLANT
KIT TURNOVER KIT A (KITS) IMPLANT
MANIFOLD NEPTUNE II (INSTRUMENTS) ×1 IMPLANT
NDL HYPO 22X1.5 SAFETY MO (MISCELLANEOUS) ×2 IMPLANT
NEEDLE HYPO 22X1.5 SAFETY MO (MISCELLANEOUS) ×2 IMPLANT
NS IRRIG 1000ML POUR BTL (IV SOLUTION) ×1 IMPLANT
PACK TOTAL KNEE CUSTOM (KITS) ×1 IMPLANT
PADDING CAST COTTON 6X4 STRL (CAST SUPPLIES) ×1 IMPLANT
PIN STEINMAN FIXATION KNEE (PIN) IMPLANT
PROTECTOR NERVE ULNAR (MISCELLANEOUS) ×1 IMPLANT
SET HNDPC FAN SPRY TIP SCT (DISPOSABLE) ×1 IMPLANT
SPIKE FLUID TRANSFER (MISCELLANEOUS) ×2 IMPLANT
SUT MNCRL AB 3-0 PS2 18 (SUTURE) ×1 IMPLANT
SUT VIC AB 0 CT1 36 (SUTURE) ×2 IMPLANT
SUT VIC AB 1 CT1 36 (SUTURE) ×2 IMPLANT
SUT VIC AB 2-0 CT1 TAPERPNT 27 (SUTURE) ×1 IMPLANT
SYR CONTROL 10ML LL (SYRINGE) ×2 IMPLANT
TIBIAL BASE ROT PLAT SZ 8 KNEE (Knees) ×1 IMPLANT
TRAY CATH INTERMITTENT SS 16FR (CATHETERS) IMPLANT
TUBE SUCTION HIGH CAP CLEAR NV (SUCTIONS) ×1 IMPLANT
WATER STERILE IRR 1000ML POUR (IV SOLUTION) ×2 IMPLANT
WRAP KNEE MAXI GEL POST OP (GAUZE/BANDAGES/DRESSINGS) ×1 IMPLANT

## 2023-12-19 NOTE — Plan of Care (Signed)
   Problem: Activity: Goal: Risk for activity intolerance will decrease Outcome: Progressing   Problem: Nutrition: Goal: Adequate nutrition will be maintained Outcome: Progressing   Problem: Pain Managment: Goal: General experience of comfort will improve and/or be controlled Outcome: Progressing   Problem: Safety: Goal: Ability to remain free from injury will improve Outcome: Progressing

## 2023-12-19 NOTE — H&P (Signed)
 TOTAL KNEE ADMISSION H&P  Patient is being admitted for left total knee arthroplasty.  Subjective:  Chief Complaint:left knee pain.  HPI: Raymond Willis, 63 y.o. male, has a history of pain and functional disability in the left knee due to arthritis and has failed non-surgical conservative treatments for greater than 12 weeks to includeNSAID's and/or analgesics, corticosteriod injections, viscosupplementation injections, supervised PT with diminished ADL's post treatment, and activity modification.  Onset of symptoms was gradual, starting 5 years ago with gradually worsening course since that time. The patient noted no past surgery on the left knee(s).  Patient currently rates pain in the left knee(s) at 9 out of 10 with activity. Patient has night pain, worsening of pain with activity and weight bearing, pain that interferes with activities of daily living, pain with passive range of motion, and joint swelling.  Patient has evidence of subchondral sclerosis, periarticular osteophytes, joint subluxation, and joint space narrowing by imaging studies. This patient has had  failure of all reasonable care . There is no active infection.  Patient Active Problem List   Diagnosis Date Noted   Hyperglycemia 10/18/2023   Diabetes mellitus without complication (HCC) 10/18/2023   Vertigo 03/28/2021   Knee pain 02/02/2020   Eczema 09/18/2018   Joint pain 09/20/2017   GERD (gastroesophageal reflux disease) 03/08/2017   Routine general medical examination at a health care facility 07/06/2016   Advance care planning 07/06/2016   Gout 05/22/2016   Erectile dysfunction 12/30/2015   Essential hypertension 12/30/2015   Hyperlipidemia 12/30/2015   Past Medical History:  Diagnosis Date   Arthritis    Erectile dysfunction    Gout    Hypercholesteremia    Hypertension    Insomnia    Prediabetes    Vitamin D deficiency     Past Surgical History:  Procedure Laterality Date   BILIARY STENT PLACEMENT  N/A 07/26/2018   Procedure: BILIARY STENT PLACEMENT;  Surgeon: Evangeline Hilts, MD;  Location: [facility];  Service: Endoscopy;  Laterality: N/A;   BILIARY STENT PLACEMENT N/A 08/08/2018   Procedure: BILIARY STENT PLACEMENT;  Surgeon: Ozell Blunt, MD;  Location: [facility];  Service: Endoscopy;  Laterality: N/A;   CHOLECYSTECTOMY N/A 06/19/2018   Procedure: LAPAROSCOPIC CHOLECYSTECTOMY;  Surgeon: Oza Blumenthal, MD;  Location: [facility] OR;  Service: General;  Laterality: N/A;   COLONOSCOPY  04/2017   COLONOSCOPY WITH ESOPHAGOGASTRODUODENOSCOPY (EGD)     ERCP N/A 07/26/2018   Procedure: ENDOSCOPIC RETROGRADE CHOLANGIOPANCREATOGRAPHY (ERCP);  Surgeon: Evangeline Hilts, MD;  Location: [facility];  Service: Endoscopy;  Laterality: N/A;   ERCP N/A 08/08/2018   Procedure: ENDOSCOPIC RETROGRADE CHOLANGIOPANCREATOGRAPHY (ERCP);  Surgeon: Ozell Blunt, MD;  Location: [facility];  Service: Endoscopy;  Laterality: N/A;   ERCP N/A 10/22/2018   Procedure: ENDOSCOPIC RETROGRADE CHOLANGIOPANCREATOGRAPHY (ERCP);  Surgeon: Ozell Blunt, MD;  Location: [facility];  Service: Gastroenterology;  Laterality: N/A;   IRRIGATION AND DEBRIDEMENT ABSCESS N/A 09/25/2014   Procedure: IRRIGATION AND DEBRIDEMENT ABSCESS;  Surgeon: Florencio Hunting, MD;  Location: [facility];  Service: Urology;  Laterality: N/A;   REMOVAL OF STONES  08/08/2018   Procedure: REMOVAL OF STONES;  Surgeon: Ozell Blunt, MD;  Location: [facility];  Service: Endoscopy;;   REMOVAL OF STONES  10/22/2018   Procedure: REMOVAL OF STONES;  Surgeon: Ozell Blunt, MD;  Location: [facility];  Service: Gastroenterology;;   Edmon Gosling EXPLORATION N/A 09/25/2014   Procedure: SCROTUM EXPLORATION;  Surgeon: Florencio Hunting, MD;  Location: [facility] OR;  Service: Urology;  Laterality: N/A;   SPHINCTEROTOMY  07/26/2018   Procedure: SPHINCTEROTOMY;  Surgeon: Evangeline Hilts, MD;  Location: [facility];  Service: Endoscopy;;   SPHINCTEROTOMY  08/08/2018   Procedure: [address];   Surgeon: Ozell Blunt, MD;  Location: [facility];  Service: Endoscopy;;   SPHINCTEROTOMY  10/22/2018   Procedure: [address];  Surgeon: Ozell Blunt, MD;  Location: [facility];  Service: Gastroenterology;;   Alberteen Aloe CHOLANGIOSCOPY N/A 08/08/2018   Procedure: ZOXWRUEA CHOLANGIOSCOPY;  Surgeon: Ozell Blunt, MD;  Location: [facility];  Service: Endoscopy;  Laterality: N/A;   SPYGLASS CHOLANGIOSCOPY N/A 10/22/2018   Procedure: SPYGLASS CHOLANGIOSCOPY;  Surgeon: Ozell Blunt, MD;  Location: [facility];  Service: Gastroenterology;  Laterality: N/A;   SPYGLASS LITHOTRIPSY N/A 08/08/2018   Procedure: [id number] LITHOTRIPSY;  Surgeon: Ozell Blunt, MD;  Location: [facility];  Service: Endoscopy;  Laterality: N/A;   SPYGLASS LITHOTRIPSY N/A 10/22/2018   Procedure: JYNWGNFA LITHOTRIPSY;  Surgeon: Ozell Blunt, MD;  Location: [facility];  Service: Gastroenterology;  Laterality: N/A;   STENT REMOVAL  10/22/2018   Procedure: STENT REMOVAL;  Surgeon: Ozell Blunt, MD;  Location: [facility];  Service: Gastroenterology;;   TONSILLECTOMY      No current facility-administered medications for this encounter.   Current Outpatient Medications  Medication Sig Dispense Refill Last Dose/Taking   amLODipine (NORVASC) 10 MG tablet Take 1 tablet (10 mg total) by mouth daily. 90 tablet 3 Taking   Ascorbic Acid (VITAMIN C PO) Take 1,000 mg by mouth daily.   Taking   cholecalciferol (VITAMIN D3) 25 MCG (1000 UNIT) tablet Take 2,000 Units by mouth daily.   Taking   colchicine 0.6 MG tablet Take 1 tablet (0.6 mg total) by mouth daily as needed (for gout flare ups). Okay to fill with mitigare or colcrys if cheaper. 30 tablet 1 Taking As Needed   diclofenac Sodium (VOLTAREN) 1 % GEL Apply 2 g topically 2 (two) times daily as needed. (Patient taking differently: Apply 2 g topically daily. Mixed with red alcohol) 300 g 12 Taking Differently   lisinopril-hydrochlorothiazide (ZESTORETIC) 20-12.5 MG tablet Take 1 tablet by  mouth daily. 90 tablet 3 Taking   Magnesium 250 MG TABS Take 250 mg by mouth daily.   Taking   pravastatin (PRAVACHOL) 10 MG tablet Take 1 tablet (10 mg total) by mouth daily. 90 tablet 3 Taking   TURMERIC PO Take 2,000 mg by mouth at bedtime. 1000 mg each   Taking   celecoxib (CELEBREX) 200 MG capsule Take by mouth 2 (two) times daily.      EQ ASPIRIN ADULT LOW DOSE 81 MG tablet Take by mouth.      oxyCODONE (OXY IR/ROXICODONE) 5 MG immediate release tablet Take 5 mg by mouth every 4 (four) hours as needed.      tiZANidine (ZANAFLEX) 2 MG tablet Take 2 mg by mouth.      Allergies  Allergen Reactions   Penicillins Hives and Other (See Comments)    Did it involve swelling of the face/tongue/throat, SOB, or low BP? Unknown Did it involve sudden or severe rash/hives, skin peeling, or any reaction on the inside of your mouth or nose? Unknown Did you need to seek medical attention at a hospital or doctor's office? Unknown When did it last happen?      Childhood allergy If all above answers are "NO", may proceed with cephalosporin use.    Sildenafil Nausea And Vomiting and Other (See Comments)    Possible cause of GI upset    Social History   Tobacco Use   Smoking  status: Never   Smokeless tobacco: Never  Substance Use Topics   Alcohol use: Not Currently    Family History  Problem Relation Age of Onset   Hypertension Father    Lung cancer Mother    Diabetes Paternal Aunt    Colon cancer Neg Hx    Prostate cancer Neg Hx      Review of Systems ROS: I have reviewed the patient's review of systems thoroughly and there are no positive responses as relates to the HPI.  Objective:  Physical Exam  Vital signs in last 24 hours:   Well-developed well-nourished patient in no acute distress. Alert and oriented x3 HEENT:within normal limits Cardiac: Regular rate and rhythm Pulmonary: Lungs clear to auscultation Abdomen: Soft and nontender.  Normal active bowel  sounds  Musculoskeletal: (l knee: painful rom, limited rom, no instability. NVI distally )  Labs: Recent Results (from the past 2160 hours)  Comprehensive metabolic panel     Status: None   Collection Time: 10/16/23  3:02 PM  Result Value Ref Range   Sodium 140 135 - 145 mEq/L   Potassium 4.0 3.5 - 5.1 mEq/L   Chloride 102 96 - 112 mEq/L   CO2 29 19 - 32 mEq/L   Glucose, Bld 87 70 - 99 mg/dL   BUN 14 6 - 23 mg/dL   Creatinine, Ser 4.09 0.40 - 1.50 mg/dL   Total Bilirubin 0.6 0.2 - 1.2 mg/dL   Alkaline Phosphatase 87 39 - 117 U/L   AST 15 0 - 37 U/L   ALT 15 0 - 53 U/L   Total Protein 7.2 6.0 - 8.3 g/dL   Albumin 4.4 3.5 - 5.2 g/dL   GFR 81.19 >14.78 mL/min    Comment: Calculated using the CKD-EPI Creatinine Equation (2021)   Calcium 9.1 8.4 - 10.5 mg/dL  Lipid panel     Status: Abnormal   Collection Time: 10/16/23  3:02 PM  Result Value Ref Range   Cholesterol 216 (H) 0 - 200 mg/dL    Comment: ATP III Classification       Desirable:  < 200 mg/dL               Borderline High:  200 - 239 mg/dL          High:  > = 295 mg/dL   Triglycerides 621.3 0.0 - 149.0 mg/dL    Comment: Normal:  <086 mg/dLBorderline High:  150 - 199 mg/dL   HDL 57.84 >69.62 mg/dL   VLDL 95.2 0.0 - 84.1 mg/dL   LDL Cholesterol 324 (H) 0 - 99 mg/dL   Total CHOL/HDL Ratio 4     Comment:                Men          Women1/2 Average Risk     3.4          3.3Average Risk          5.0          4.42X Average Risk          9.6          7.13X Average Risk          15.0          11.0                       NonHDL 162.02     Comment: NOTE:  Non-HDL goal should be 30  mg/dL higher than patient's LDL goal (i.e. LDL goal of < 70 mg/dL, would have non-HDL goal of < 100 mg/dL)  CBC with Differential/Platelet     Status: None   Collection Time: 10/16/23  3:02 PM  Result Value Ref Range   WBC 7.0 4.0 - 10.5 K/uL   RBC 5.49 4.22 - 5.81 Mil/uL   Hemoglobin 15.6 13.0 - 17.0 g/dL   HCT 16.1 09.6 - 04.5 %   MCV 84.6 78.0 -  100.0 fl   MCHC 33.5 30.0 - 36.0 g/dL   RDW 40.9 81.1 - 91.4 %   Platelets 299.0 150.0 - 400.0 K/uL   Neutrophils Relative % 52.7 43.0 - 77.0 %   Lymphocytes Relative 35.9 12.0 - 46.0 %   Monocytes Relative 9.3 3.0 - 12.0 %   Eosinophils Relative 1.1 0.0 - 5.0 %   Basophils Relative 1.0 0.0 - 3.0 %   Neutro Abs 3.7 1.4 - 7.7 K/uL   Lymphs Abs 2.5 0.7 - 4.0 K/uL   Monocytes Absolute 0.7 0.1 - 1.0 K/uL   Eosinophils Absolute 0.1 0.0 - 0.7 K/uL   Basophils Absolute 0.1 0.0 - 0.1 K/uL  PSA     Status: None   Collection Time: 10/16/23  3:02 PM  Result Value Ref Range   PSA 2.71 0.10 - 4.00 ng/mL    Comment: Test performed using Access Hybritech PSA Assay, a parmagnetic partical, chemiluminecent immunoassay.  Hemoglobin A1c     Status: Abnormal   Collection Time: 10/16/23  3:02 PM  Result Value Ref Range   Hgb A1c MFr Bld 6.6 (H) 4.6 - 6.5 %    Comment: Glycemic Control Guidelines for People with Diabetes:Non Diabetic:  <6%Goal of Therapy: <7%Additional Action Suggested:  >8%   Uric acid     Status: None   Collection Time: 10/16/23  3:02 PM  Result Value Ref Range   Uric Acid, Serum 6.7 4.0 - 7.8 mg/dL  Surgical pcr screen     Status: None   Collection Time: 12/10/23 10:21 AM   Specimen: Nasal Mucosa; Nasal Swab  Result Value Ref Range   MRSA, PCR NEGATIVE NEGATIVE   Staphylococcus aureus NEGATIVE NEGATIVE    Comment: (NOTE) The Xpert SA Assay (FDA approved for NASAL specimens in patients 29 years of age and older), is one component of a comprehensive surveillance program. It is not intended to diagnose infection nor to guide or monitor treatment. Performed at [facility], [address] [place], [place] [address]   CBC per protocol     Status: None   Collection Time: 12/10/23 10:21 AM  Result Value Ref Range   WBC 6.0 4.0 - 10.5 K/uL   RBC 5.51 4.22 - 5.81 MIL/uL   Hemoglobin 15.5 13.0 - 17.0 g/dL   HCT 62.1 30.8 - 65.7 %   MCV 85.5 80.0 - 100.0 fL    MCH 28.1 26.0 - 34.0 pg   MCHC 32.9 30.0 - 36.0 g/dL   RDW 84.6 96.2 - 95.2 %   Platelets 313 150 - 400 K/uL   nRBC 0.0 0.0 - 0.2 %    Comment: Performed at [facility], [address] [place], [place] [address]  Basic metabolic panel per protocol     Status: Abnormal   Collection Time: 12/10/23 10:21 AM  Result Value Ref Range   Sodium 140 135 - 145 mmol/L   Potassium 3.7 3.5 - 5.1 mmol/L   Chloride 106 98 - 111 mmol/L  CO2 23 22 - 32 mmol/L   Glucose, Bld 100 (H) 70 - 99 mg/dL    Comment: Glucose reference range applies only to samples taken after fasting for at least 8 hours.   BUN 18 8 - 23 mg/dL   Creatinine, Ser 4.33 0.61 - 1.24 mg/dL   Calcium 9.1 8.9 - 29.5 mg/dL   GFR, Estimated >18 >84 mL/min    Comment: (NOTE) Calculated using the CKD-EPI Creatinine Equation (2021)    Anion gap 11 5 - 15    Comment: Performed at [facility], [address], [place], [place] [address]     Estimated body mass index is 32.84 kg/m as calculated from the following:   Height as of 12/10/23: 5\' 8"  (1.727 m).   Weight as of 12/10/23: 98 kg.   Imaging Review Plain radiographs demonstrate severe degenerative joint disease of the left knee(s). The overall alignment ismild varus. The bone quality appears to be fair for age and reported activity level.      Assessment/Plan:  End stage arthritis, left knee   The patient history, physical examination, clinical judgment of the provider and imaging studies are consistent with end stage degenerative joint disease of the left knee(s) and total knee arthroplasty is deemed medically necessary. The treatment options including medical management, injection therapy arthroscopy and arthroplasty were discussed at length. The risks and benefits of total knee arthroplasty were presented and reviewed. The risks due to aseptic loosening, infection, stiffness, patella tracking problems, thromboembolic complications and  other imponderables were discussed. The patient acknowledged the explanation, agreed to proceed with the plan and consent was signed. Patient is being admitted for inpatient treatment for surgery, pain control, PT, OT, prophylactic antibiotics, VTE prophylaxis, progressive ambulation and ADL's and discharge planning. The patient is planning to be discharged home with home health services     Patient's anticipated LOS is less than 2 midnights, meeting these requirements: - Younger than 5 - Lives within 1 hour of care - Has a competent adult at home to recover with post-op recover - NO history of  - Chronic pain requiring opiods  - Diabetes  - Coronary Artery Disease  - Heart failure  - Heart attack  - Stroke  - DVT/VTE  - Cardiac arrhythmia  - Respiratory Failure/COPD  - Renal failure  - Anemia  - Advanced Liver disease

## 2023-12-19 NOTE — Anesthesia Procedure Notes (Signed)
 Spinal  Start time: 12/19/2023 10:50 AM End time: 12/19/2023 10:54 AM Reason for block: surgical anesthesia Staffing Performed: resident/CRNA  Resident/CRNA: Marshall Skeeter, CRNA Performed by: Marshall Skeeter, CRNA Authorized by: Earvin Goldberg, MD   Preanesthetic Checklist Completed: patient identified, IV checked, site marked, risks and benefits discussed, surgical consent, monitors and equipment checked, pre-op evaluation and timeout performed Spinal Block Patient position: sitting Prep: DuraPrep and site prepped and draped Patient monitoring: heart rate, continuous pulse ox, blood pressure and cardiac monitor Approach: midline Location: L3-4 Injection technique: single-shot Needle Needle type: Pencan  Needle gauge: 24 G Needle length: 10 cm Assessment Sensory level: T6 Events: CSF return Additional Notes Pt placed in sitting position for placement. Spinal kit expiration date checked and verified. Lot # [id number]. Sterile prep and drape of back. Site anesthetized with local. One attempt. + free flowing clear CSF. - heme. Pt tolerated well. Pt placed supine after procedure.

## 2023-12-19 NOTE — Anesthesia Postprocedure Evaluation (Signed)
 Anesthesia Post Note  Patient: Raymond Willis  Procedure(s) Performed: ARTHROPLASTY, KNEE, TOTAL (Left: Knee)     Patient location during evaluation: PACU Anesthesia Type: Spinal Level of consciousness: awake and alert Pain management: pain level controlled Vital Signs Assessment: post-procedure vital signs reviewed and stable Respiratory status: spontaneous breathing, nonlabored ventilation and respiratory function stable Cardiovascular status: blood pressure returned to baseline and stable Postop Assessment: no apparent nausea or vomiting Anesthetic complications: no   No notable events documented.  Last Vitals:  Vitals:   12/19/23 1415 12/19/23 1430  BP: 132/89 128/87  Pulse: (!) 59 62  Resp: 10 12  Temp:    SpO2: 96% 97%    Last Pain:  Vitals:   12/19/23 1415  TempSrc:   PainSc: Asleep                 Earvin Goldberg

## 2023-12-19 NOTE — Interval H&P Note (Signed)
 History and Physical Interval Note:  12/19/2023 10:33 AM  Raymond Willis  has presented today for surgery, with the diagnosis of LEFT KNEE DEGENERATIVE JOINT DISEASE.  The various methods of treatment have been discussed with the patient and family. After consideration of risks, benefits and other options for treatment, the patient has consented to  Procedure(s): ARTHROPLASTY, KNEE, TOTAL (Left) as a surgical intervention.  The patient's history has been reviewed, patient examined, no change in status, stable for surgery.  I have reviewed the patient's chart and labs.  Questions were answered to the patient's satisfaction.     Boston Byers

## 2023-12-19 NOTE — Evaluation (Signed)
 Physical Therapy Evaluation Patient Details Name: Raymond Willis MRN: [id number] DOB: Jun 01, 1961 Today's Date: 12/19/2023  History of Present Illness  63 yo male s/p L TKA on 12/19/23. PMH: DM, HTN, gout. vertigo  Clinical Impression  Pt is s/p TKA resulting in the deficits listed below (see PT Problem List).  Pt amb ~20' with RW and CGA-min assist. Reviewed ankle pumps and encouraged quad sets and the importance of terminal knee ext as pt had extension limitations prior to surgery  Pt will benefit from acute skilled PT to increase their independence and safety with mobility to allow discharge.          If plan is discharge home, recommend the following: A little help with bathing/dressing/bathroom;Help with stairs or ramp for entrance;Assistance with cooking/housework   Can travel by private vehicle        Equipment Recommendations None recommended by PT  Recommendations for Other Services       Functional Status Assessment Patient has had a recent decline in their functional status and demonstrates the ability to make significant improvements in function in a reasonable and predictable amount of time.     Precautions / Restrictions Precautions Precautions: Fall;Knee Restrictions Weight Bearing Restrictions Per Provider Order: No Other Position/Activity Restrictions: WBAT      Mobility  Bed Mobility Overal bed mobility: Needs Assistance Bed Mobility: Supine to Sit     Supine to sit: Supervision, Contact guard     General bed mobility comments: for lines and safety, no physical assist    Transfers Overall transfer level: Needs assistance Equipment used: Rolling walker (2 wheels) Transfers: Sit to/from Stand Sit to Stand: Contact guard assist, Min assist           General transfer comment: cues for hand placement and LLE position    Ambulation/Gait   Gait Distance (Feet): 20 Feet Assistive device: Rolling walker (2 wheels) Gait Pattern/deviations:  Step-to pattern, Decreased weight shift to left       General Gait Details: cues for sequence, internal rotation LLE, knee extension as able in stance.  Stairs            Wheelchair Mobility     Tilt Bed    Modified Rankin (Stroke Patients Only)       Balance                                             Pertinent Vitals/Pain Pain Assessment Pain Assessment: 0-10 Pain Score: 4  Pain Location: right knee Pain Descriptors / Indicators: Aching, Discomfort, Sore Pain Intervention(s): Limited activity within patient's tolerance, Monitored during session, Premedicated before session    Home Living Family/patient expects to be discharged to:: Private residence Living Arrangements: Spouse/significant other Available Help at Discharge: Family Type of Home: House Home Access: Stairs to enter Entrance Stairs-Rails: Right Entrance Stairs-Number of Steps: 2   Home Layout: One level Home Equipment: Agricultural consultant (2 wheels)      Prior Function Prior Level of Function : Independent/Modified Independent                     Extremity/Trunk Assessment   Upper Extremity Assessment Upper Extremity Assessment: Overall WFL for tasks assessed    Lower Extremity Assessment Lower Extremity Assessment: RLE deficits/detail RLE Deficits / Details: ankle WFL, knee extension and hip flexion 3/5, AROM  -15 degrees to 55  degrees knee flexion       Communication   Communication Communication: No apparent difficulties    Cognition Arousal: Alert Behavior During Therapy: WFL for tasks assessed/performed   PT - Cognitive impairments: No apparent impairments                         Following commands: Intact       Cueing Cueing Techniques: Verbal cues     General Comments      Exercises Total Joint Exercises Ankle Circles/Pumps: AROM, Both, 10 reps Quad Sets: AROM, Both, 5 reps   Assessment/Plan    PT Assessment Patient needs  continued PT services  PT Problem List Decreased strength;Decreased range of motion;Decreased activity tolerance;Decreased balance;Decreased mobility;Decreased knowledge of use of DME;Pain       PT Treatment Interventions DME instruction;Gait training;Stair training;Functional mobility training;Therapeutic activities;Therapeutic exercise;Patient/family education    PT Goals (Current goals can be found in the Care Plan section)  Acute Rehab PT Goals PT Goal Formulation: With patient Time For Goal Achievement: 12/26/23 Potential to Achieve Goals: Good    Frequency 7X/week     Co-evaluation               AM-PAC PT "6 Clicks" Mobility  Outcome Measure Help needed turning from your back to your side while in a flat bed without using bedrails?: A Little Help needed moving from lying on your back to sitting on the side of a flat bed without using bedrails?: A Little Help needed moving to and from a bed to a chair (including a wheelchair)?: A Little Help needed standing up from a chair using your arms (e.g., wheelchair or bedside chair)?: A Little Help needed to walk in hospital room?: A Little Help needed climbing 3-5 steps with a railing? : A Lot 6 Click Score: 17    End of Session Equipment Utilized During Treatment: Gait belt Activity Tolerance: Patient tolerated treatment well Patient left: in chair;with call bell/phone within reach;with chair alarm set;with family/visitor present   PT Visit Diagnosis: Other abnormalities of gait and mobility (R26.89)    Time: 1478-2956 PT Time Calculation (min) (ACUTE ONLY): 22 min   Charges:   PT Evaluation $PT Eval Low Complexity: 1 Low   PT General Charges $$ ACUTE PT VISIT: 1 Visit         Elisa Sorlie, PT  Acute Rehab Dept [facility]) [phone]  12/19/2023   [facility] 12/19/2023, 5:47 PM

## 2023-12-19 NOTE — Anesthesia Procedure Notes (Signed)
 Procedure Name: MAC Date/Time: 12/19/2023 10:47 AM  Performed by: Marshall Skeeter, CRNAPre-anesthesia Checklist: Patient identified, Emergency Drugs available, Suction available, Patient being monitored and Timeout performed Oxygen Delivery Method: Simple face mask Placement Confirmation: positive ETCO2 Dental Injury: Teeth and Oropharynx as per pre-operative assessment

## 2023-12-19 NOTE — Anesthesia Preprocedure Evaluation (Signed)
 Anesthesia Evaluation  Patient identified by MRN, date of birth, ID band Patient awake    Reviewed: Allergy & Precautions, Patient's Chart, lab work & pertinent test results  Airway Mallampati: II  TM Distance: >3 FB Neck ROM: Full    Dental no notable dental hx.    Pulmonary neg pulmonary ROS   Pulmonary exam normal breath sounds clear to auscultation       Cardiovascular hypertension, Pt. on medications Normal cardiovascular exam Rhythm:Regular Rate:Normal     Neuro/Psych    GI/Hepatic ,GERD  ,,  Endo/Other  diabetes    Renal/GU Renal disease     Musculoskeletal  (+) Arthritis , Osteoarthritis,    Abdominal  (+) + obese  Peds  Hematology   Anesthesia Other Findings   Reproductive/Obstetrics                             Anesthesia Physical Anesthesia Plan  ASA: III  Anesthesia Plan: Spinal   Post-op Pain Management: Regional block*   Induction: Intravenous  PONV Risk Score and Plan: 1 and Propofol infusion and Treatment may vary due to age or medical condition  Airway Management Planned: Simple Face Mask  Additional Equipment:   Intra-op Plan:   Post-operative Plan:   Informed Consent:      Dental advisory given  Plan Discussed with: CRNA and Anesthesiologist  Anesthesia Plan Comments:         Anesthesia Quick Evaluation

## 2023-12-19 NOTE — Transfer of Care (Signed)
 Immediate Anesthesia Transfer of Care Note  Patient: Raymond Willis  Procedure(s) Performed: ARTHROPLASTY, KNEE, TOTAL (Left: Knee)  Patient Location: PACU  Anesthesia Type:MAC and Spinal  Level of Consciousness: drowsy and patient cooperative  Airway & Oxygen Therapy: Patient Spontanous Breathing and Patient connected to face mask oxygen  Post-op Assessment: Report given to RN and Post -op Vital signs reviewed and stable  Post vital signs: Reviewed and stable  Last Vitals:  Vitals Value Taken Time  BP 102/68 12/19/23 1248  Temp    Pulse 66 12/19/23 1249  Resp 11 12/19/23 1249  SpO2 97 % 12/19/23 1249  Vitals shown include unfiled device data.  Last Pain:  Vitals:   12/19/23 0815  TempSrc:   PainSc: 5       Patients Stated Pain Goal: 4 (12/19/23 0815)  Complications: No notable events documented.

## 2023-12-19 NOTE — Anesthesia Procedure Notes (Signed)
 Anesthesia Regional Block: Adductor canal block   Pre-Anesthetic Checklist: , timeout performed,  Correct Patient, Correct Site, Correct Laterality,  Correct Procedure, Correct Position, site marked,  Risks and benefits discussed,  Surgical consent,  Pre-op evaluation,  At surgeon's request and post-op pain management  Laterality: Left  Prep: chloraprep       Needles:  Injection technique: Single-shot  Needle Type: Stimiplex     Needle Length: 9cm  Needle Gauge: 21     Additional Needles:   Procedures:,,,, ultrasound used (permanent image in chart),,    Narrative:  Start time: 12/19/2023 9:16 AM End time: 12/19/2023 9:21 AM Injection made incrementally with aspirations every 5 mL.  Performed by: Personally

## 2023-12-19 NOTE — Plan of Care (Signed)
  Problem: Education: Goal: Knowledge of General Education information will improve Description: Including pain rating scale, medication(s)/side effects and non-pharmacologic comfort measures Outcome: Progressing   Problem: Health Behavior/Discharge Planning: Goal: Ability to manage health-related needs will improve Outcome: Progressing   Problem: Clinical Measurements: Goal: Ability to maintain clinical measurements within normal limits will improve Outcome: Progressing Goal: Will remain free from infection Outcome: Progressing Goal: Diagnostic test results will improve Outcome: Progressing Goal: Respiratory complications will improve Outcome: Progressing Goal: Cardiovascular complication will be avoided Outcome: Progressing   Problem: Activity: Goal: Risk for activity intolerance will decrease Outcome: Progressing   Problem: Nutrition: Goal: Adequate nutrition will be maintained Outcome: Completed/Met   Problem: Coping: Goal: Level of anxiety will decrease Outcome: Progressing   Problem: Elimination: Goal: Will not experience complications related to bowel motility Outcome: Progressing Goal: Will not experience complications related to urinary retention Outcome: Progressing   Problem: Pain Managment: Goal: General experience of comfort will improve and/or be controlled Outcome: Progressing   Problem: Safety: Goal: Ability to remain free from injury will improve Outcome: Progressing   Problem: Skin Integrity: Goal: Risk for impaired skin integrity will decrease Outcome: Progressing   Problem: Education: Goal: Knowledge of the prescribed therapeutic regimen will improve Outcome: Progressing Goal: Individualized Educational Video(s) Outcome: Completed/Met   Problem: Activity: Goal: Ability to avoid complications of mobility impairment will improve Outcome: Progressing Goal: Range of joint motion will improve Outcome: Adequate for Discharge   Problem:  Clinical Measurements: Goal: Postoperative complications will be avoided or minimized Outcome: Progressing   Problem: Pain Management: Goal: Pain level will decrease with appropriate interventions Outcome: Progressing   Problem: Skin Integrity: Goal: Will show signs of wound healing Outcome: Progressing

## 2023-12-19 NOTE — Discharge Instructions (Signed)

## 2023-12-20 ENCOUNTER — Encounter (HOSPITAL_COMMUNITY): Payer: Self-pay | Admitting: Orthopedic Surgery

## 2023-12-20 DIAGNOSIS — M1712 Unilateral primary osteoarthritis, left knee: Secondary | ICD-10-CM | POA: Diagnosis not present

## 2023-12-20 NOTE — Plan of Care (Signed)
  Problem: Education: Goal: Knowledge of General Education information will improve Description: Including pain rating scale, medication(s)/side effects and non-pharmacologic comfort measures Outcome: Progressing   Problem: Activity: Goal: Risk for activity intolerance will decrease Outcome: Progressing   Problem: Education: Goal: Knowledge of the prescribed therapeutic regimen will improve Outcome: Progressing

## 2023-12-20 NOTE — Discharge Summary (Signed)
 Patient ID: Raymond Willis MRN: [id number] DOB/AGE: 04-12-1961 63 y.o.  Admit date: 12/19/2023 Discharge date: 12/20/2023  Admission Diagnoses:  Principal Problem:   Primary osteoarthritis of left knee   Discharge Diagnoses:  Same  Past Medical History:  Diagnosis Date   Arthritis    Erectile dysfunction    Gout    Hypercholesteremia    Hypertension    Insomnia    Prediabetes    Vitamin D deficiency     Surgeries: Procedure(s): Left ARTHROPLASTY, KNEE, TOTAL on 12/19/2023   Consultants:   Discharged Condition: Improved  Hospital Course: Raymond Willis is an 63 y.o. adult who was admitted 12/19/2023 for operative treatment ofPrimary osteoarthritis of left knee. Patient has severe unremitting pain that affects sleep, daily activities, and work/hobbies. After pre-op clearance the patient was taken to the operating room on 12/19/2023 and underwent  Procedure(s): Left ARTHROPLASTY, KNEE, TOTAL.    Patient was given perioperative antibiotics:  Anti-infectives (From admission, onward)    Start     Dose/Rate Route Frequency Ordered Stop   12/19/23 1800  ceFAZolin (ANCEF) IVPB 2g/100 mL premix        2 g 200 mL/hr over 30 Minutes Intravenous Every 6 hours 12/19/23 1459 12/20/23 0031   12/19/23 0800  ceFAZolin (ANCEF) IVPB 2g/100 mL premix        2 g 200 mL/hr over 30 Minutes Intravenous On call to O.R. 12/19/23 0750 12/19/23 1125        Patient was given sequential compression devices, early ambulation, and chemoprophylaxis to prevent DVT.  Patient benefited maximally from hospital stay and there were no complications.    Recent vital signs: Patient Vitals for the past 24 hrs:  BP Temp Temp src Pulse Resp SpO2  12/20/23 0613 (!) 126/92 (!) 97.5 F (36.4 C) Oral 91 16 96 %  12/20/23 0138 (!) 122/93 97.7 F (36.5 C) Oral 96 16 96 %  12/19/23 2206 117/88 97.8 F (36.6 C) Oral 100 16 97 %  12/19/23 1711 124/89 97.6 F (36.4 C) -- 85 17 97 %  12/19/23 1507 132/87 97.8 F  (36.6 C) -- 63 18 97 %  12/19/23 1445 137/89 -- -- 63 18 97 %  12/19/23 1430 128/87 -- -- 62 12 97 %  12/19/23 1415 132/89 -- -- (!) 59 10 96 %  12/19/23 1400 130/85 -- -- (!) 59 12 97 %  12/19/23 1345 126/88 -- -- (!) 59 16 96 %  12/19/23 1330 125/82 -- -- 60 16 96 %  12/19/23 1315 113/79 -- -- 63 15 96 %  12/19/23 1300 102/71 -- -- 65 17 94 %  12/19/23 1248 102/68 97.9 F (36.6 C) -- 67 11 97 %  12/19/23 0938 -- -- -- 65 10 96 %  12/19/23 0933 -- -- -- 64 10 96 %  12/19/23 0928 120/86 -- -- 63 15 97 %  12/19/23 0923 -- -- -- 65 16 93 %  12/19/23 0918 (!) 114/91 -- -- 61 15 96 %  12/19/23 0913 126/80 -- -- -- 16 --     Recent laboratory studies: No results for input(s): "WBC", "HGB", "HCT", "PLT", "NA", "K", "CL", "CO2", "BUN", "CREATININE", "GLUCOSE", "INR", "CALCIUM" in the last 72 hours.  Invalid input(s): "PT", "2"   Discharge Medications:   Allergies as of 12/20/2023       Reactions   Penicillins Hives, Other (See Comments)   Did it involve swelling of the face/tongue/throat, SOB, or low BP? Unknown Did it involve  sudden or severe rash/hives, skin peeling, or any reaction on the inside of your mouth or nose? Unknown Did you need to seek medical attention at a hospital or doctor's office? Unknown When did it last happen?      Childhood allergy If all above answers are "NO", may proceed with cephalosporin use.   Sildenafil Nausea And Vomiting, Other (See Comments)   Possible cause of GI upset        Medication List     STOP taking these medications    diclofenac Sodium 1 % Gel Commonly known as: VOLTAREN   oxyCODONE 5 MG immediate release tablet Commonly known as: Oxy IR/ROXICODONE       TAKE these medications    amLODipine 10 MG tablet Commonly known as: NORVASC Take 1 tablet (10 mg total) by mouth daily.   aspirin EC 325 MG tablet Take 1 tablet (325 mg total) by mouth 2 (two) times daily after a meal. Take x 1 month post op to decrease risk of blood  clots. What changed:  medication strength how much to take when to take this additional instructions   celecoxib 200 MG capsule Commonly known as: CELEBREX Take by mouth 2 (two) times daily.   cholecalciferol 25 MCG (1000 UNIT) tablet Commonly known as: VITAMIN D3 Take 2,000 Units by mouth daily.   colchicine 0.6 MG tablet Take 1 tablet (0.6 mg total) by mouth daily as needed (for gout flare ups). Okay to fill with mitigare or colcrys if cheaper.   docusate sodium 100 MG capsule Commonly known as: Colace Take 1 capsule (100 mg total) by mouth 2 (two) times daily.   lisinopril-hydrochlorothiazide 20-12.5 MG tablet Commonly known as: ZESTORETIC Take 1 tablet by mouth daily.   Magnesium 250 MG Tabs Take 250 mg by mouth daily.   oxyCODONE-acetaminophen 5-325 MG tablet Commonly known as: PERCOCET/ROXICET Take 1 tablet by mouth every 4 (four) hours as needed for severe pain (pain score 7-10).   pravastatin 10 MG tablet Commonly known as: PRAVACHOL Take 1 tablet (10 mg total) by mouth daily.   tiZANidine 2 MG tablet Commonly known as: ZANAFLEX Take 2 mg by mouth.   TURMERIC PO Take 2,000 mg by mouth at bedtime. 1000 mg each   VITAMIN C PO Take 1,000 mg by mouth daily.               Durable Medical Equipment  (From admission, onward)           Start     Ordered   12/19/23 1500  DME Walker rolling  Once       Question:  Patient needs a walker to treat with the following condition  Answer:  Primary osteoarthritis of left knee   12/19/23 1459              Discharge Care Instructions  (From admission, onward)           Start     Ordered   12/20/23 0000  Weight bearing as tolerated       Question Answer Comment  Laterality left   Extremity Lower      12/20/23 0818            Diagnostic Studies: DG Chest 2 View Result Date: 12/10/2023 CLINICAL DATA:  Preop for upcoming left knee surgery. EXAM: CHEST - 2 VIEW COMPARISON:  Radiograph  03/09/2017 FINDINGS: The cardiomediastinal contours are normal. The lungs are clear. Pulmonary vasculature is normal. No consolidation, pleural effusion, or pneumothorax. Thoracic  spondylosis. No acute osseous abnormalities are seen. IMPRESSION: No active cardiopulmonary disease. Electronically Signed   By: Chadwick Colonel M.D.   On: 12/10/2023 14:51    Disposition: Discharge disposition: 01-Home or Self Care       Discharge Instructions     Call MD / Call 911   Complete by: As directed    If you experience chest pain or shortness of breath, CALL 911 and be transported to the hospital emergency room.  If you develope a fever above 101 F, pus (white drainage) or increased drainage or redness at the wound, or calf pain, call your surgeon's office.   Constipation Prevention   Complete by: As directed    Drink plenty of fluids.  Prune juice may be helpful.  You may use a stool softener, such as Colace (over the counter) 100 mg twice a day.  Use MiraLax (over the counter) for constipation as needed.   Diet general   Complete by: As directed    Do not put a pillow under the knee. Place it under the heel.   Complete by: As directed    Increase activity slowly as tolerated   Complete by: As directed    Post-operative opioid taper instructions:   Complete by: As directed    POST-OPERATIVE OPIOID TAPER INSTRUCTIONS: It is important to wean off of your opioid medication as soon as possible. If you do not need pain medication after your surgery it is ok to stop day one. Opioids include: Codeine, Hydrocodone(Norco, Vicodin), Oxycodone(Percocet, oxycontin) and hydromorphone amongst others.  Long term and even short term use of opiods can cause: Increased pain response Dependence Constipation Depression Respiratory depression And more.  Withdrawal symptoms can include Flu like symptoms Nausea, vomiting And more Techniques to manage these symptoms Hydrate well Eat regular healthy  meals Stay active Use relaxation techniques(deep breathing, meditating, yoga) Do Not substitute Alcohol to help with tapering If you have been on opioids for less than two weeks and do not have pain than it is ok to stop all together.  Plan to wean off of opioids This plan should start within one week post op of your joint replacement. Maintain the same interval or time between taking each dose and first decrease the dose.  Cut the total daily intake of opioids by one tablet each day Next start to increase the time between doses. The last dose that should be eliminated is the evening dose.      TED hose   Complete by: As directed    Use stockings (TED hose) for 2 weeks on both leg(s).  You may remove them at night for sleeping.   Weight bearing as tolerated   Complete by: As directed    Laterality: left   Extremity: Lower        Follow-up Information     Neil Balls, MD. Schedule an appointment as soon as possible for a visit in 2 week(s).   Specialty: Orthopedic Surgery Contact information: [address] [place] [address] [phone]                  Signed: Autumn Boast 12/20/2023, 8:19 AM

## 2023-12-20 NOTE — Progress Notes (Signed)
 Subjective: 1 Day Post-Op Procedure(s) (LRB): ARTHROPLASTY, KNEE, TOTAL (Left) Patient reports pain as mild.  Taking fluids by mouth.  Foley removed this morning.  He has not voided yet.  He thinks he will be ready to go home after therapy.  Objective: Vital signs in last 24 hours: Temp:  [97.5 F (36.4 C)-97.9 F (36.6 C)] 97.5 F (36.4 C) (04/17 1610) Pulse Rate:  [59-100] 91 (04/17 0613) Resp:  [10-18] 16 (04/17 0613) BP: (102-137)/(68-93) 126/92 (04/17 9604) SpO2:  [93 %-97 %] 96 % (04/17 5409)  Intake/Output from previous day: 04/16 0701 - 04/17 0700 In: 3307.9 [P.O.:480; I.V.:2527.9; IV Piggyback:300] Out: 2650 [Urine:2600; Blood:50] Intake/Output this shift: No intake/output data recorded.  No results for input(s): "HGB" in the last 72 hours. No results for input(s): "WBC", "RBC", "HCT", "PLT" in the last 72 hours. No results for input(s): "NA", "K", "CL", "CO2", "BUN", "CREATININE", "GLUCOSE", "CALCIUM" in the last 72 hours. No results for input(s): "LABPT", "INR" in the last 72 hours. Left knee exam: Neurovascular intact Sensation intact distally Intact pulses distally Dorsiflexion/Plantar flexion intact Incision: dressing C/D/I No cellulitis present Compartment soft   Assessment/Plan: 1 Day Post-Op Procedure(s) (LRB): ARTHROPLASTY, KNEE, TOTAL (Left) Plan: Up with therapy Weight-bear as tolerated on left lower extremity. Can be discharged home after physical therapy. He is set up for outpatient physical therapy starting in 4 days at the office. Aspirin enteric-coated 325 mg twice daily x 1 month postop for DVT prophylaxis. His pain medications were sent into his pharmacy.  Follow-up in 2 weeks with Dr. Murrell Arrant.    Patient's anticipated LOS is less than 2 midnights, meeting these requirements: - Younger than 49 - Lives within 1 hour of care - Has a competent adult at home to recover with post-op recover - NO history of  - Chronic pain requiring  opiods  - Diabetes  - Coronary Artery Disease  - Heart failure  - Heart attack  - Stroke  - DVT/VTE  - Cardiac arrhythmia  - Respiratory Failure/COPD  - Renal failure  - Anemia  - Advanced Liver disease     Autumn Boast 12/20/2023, 8:15 AM

## 2023-12-20 NOTE — Progress Notes (Signed)
 Physical Therapy Treatment Patient Details Name: Raymond Willis MRN: [id number] DOB: June 13, 1961 Today's Date: 12/20/2023   History of Present Illness 63 yo male s/p L TKA on 12/19/23. PMH: DM, HTN, gout. vertigo    PT Comments  POD # 1 pm session with Spouse present. General transfer comment: cues for hand placement and LLE position with increased time.  Had Spouse "hands on" assist Pt with safety belt.  General Gait Details: cues for sequence, internal rotation LLE, knee extension as able in stance. upright posture. amb with Spouse "hands on" using safety belt. General stair comments: 50% VC's on proper sequencing, proper hand placement on ONE LEFT rail and to "lock" his L knee in extension.  With Spouse "Hands on" using safety belt. Then returned to room to perform some TE's following HEP handout.  Instructed on proper tech, freq as well as use of ICE.  Spouse brought in an ICE MAN machine that was bought thru [employer].  Educated on use and set up.  Once turned on the connecting hose continued to leak despite several attempts.  Advised Pt NOT to use.    Addressed all mobility questions, discussed appropriate activity, educated on use of ICE.  Pt ready for D/C to home.    If plan is discharge home, recommend the following: A little help with bathing/dressing/bathroom;Help with stairs or ramp for entrance;Assistance with cooking/housework   Can travel by private vehicle        Equipment Recommendations  None recommended by PT    Recommendations for Other Services       Precautions / Restrictions Precautions Precautions: Fall;Knee Precaution/Restrictions Comments: no pillow under knee Restrictions Weight Bearing Restrictions Per Provider Order: No Other Position/Activity Restrictions: WBAT     Mobility  Bed Mobility               General bed mobility comments: OOB in recliner.  Slept in recliner last night.    Transfers Overall transfer level: Needs assistance Equipment  used: Rolling walker (2 wheels) Transfers: Sit to/from Stand Sit to Stand: Supervision, Contact guard assist           General transfer comment: cues for hand placement and LLE position with increased time.  Had Spouse "hands on" assist Pt with safety belt.    Ambulation/Gait Ambulation/Gait assistance: Supervision, Contact guard assist Gait Distance (Feet): 25 Feet Assistive device: Rolling walker (2 wheels) Gait Pattern/deviations: Step-to pattern, Decreased weight shift to left Gait velocity: decreased     General Gait Details: cues for sequence, internal rotation LLE, knee extension as able in stance. upright posture. amb with Spouse "hands on" using safety belt.   Stairs Stairs: Yes Stairs assistance: Supervision, Contact guard assist Stair Management: One rail Left, Step to pattern, Forwards Number of Stairs: 2 General stair comments: 50% VC's on proper sequencing, proper hand placement on ONE LEFT rail and to "lock" his L knee in extension.  With Spouse "Hands on" using safety belt.   Wheelchair Mobility     Tilt Bed    Modified Rankin (Stroke Patients Only)       Balance                                            Communication Communication Communication: No apparent difficulties  Cognition Arousal: Alert Behavior During Therapy: WFL for tasks assessed/performed   PT - Cognitive impairments: No apparent  impairments                       PT - Cognition Comments: AxO x 3 pleasant and motivated.  Lots of questions as this is Pt's first replaceemnt. Following commands: Intact      Cueing Cueing Techniques: Verbal cues  Exercises  Total Knee Replacement TE's following HEP handout 10 reps B LE ankle pumps 05 reps towel squeezes 05 reps knee presses 05 reps heel slides  05 reps SAQ's 05 reps SLR's 05 reps ABD Educated on use of gait belt to assist with TE's Followed by ICE     General Comments        Pertinent  Vitals/Pain Pain Assessment Pain Assessment: 0-10 Pain Score: 5  Pain Location: right knee Pain Descriptors / Indicators: Aching, Discomfort, Sore, Operative site guarding Pain Intervention(s): Monitored during session, Premedicated before session, Repositioned, Ice applied    Home Living                          Prior Function            PT Goals (current goals can now be found in the care plan section) Progress towards PT goals: Progressing toward goals    Frequency    7X/week      PT Plan      Co-evaluation              AM-PAC PT "6 Clicks" Mobility   Outcome Measure  Help needed turning from your back to your side while in a flat bed without using bedrails?: A Little Help needed moving from lying on your back to sitting on the side of a flat bed without using bedrails?: A Little Help needed moving to and from a bed to a chair (including a wheelchair)?: A Little Help needed standing up from a chair using your arms (e.g., wheelchair or bedside chair)?: A Little Help needed to walk in hospital room?: A Little Help needed climbing 3-5 steps with a railing? : A Little 6 Click Score: 18    End of Session Equipment Utilized During Treatment: Gait belt Activity Tolerance: Patient tolerated treatment well Patient left: in chair;with call bell/phone within reach;with chair alarm set;with family/visitor present Nurse Communication: Mobility status PT Visit Diagnosis: Other abnormalities of gait and mobility (R26.89)     Time: 1610-9604 PT Time Calculation (min) (ACUTE ONLY): 42 min  Charges:    $Gait Training: 8-22 mins $Therapeutic Exercise: 8-22 mins PT General Charges $$ ACUTE PT VISIT: 1 Visit                     Raymond Willis  PTA Acute  Rehabilitation Services Office M-F          [phone]

## 2023-12-20 NOTE — TOC Transition Note (Signed)
 Transition of Care [facility]) - Discharge Note   Patient Details  Name: Raymond Willis MRN: [id number] Date of Birth: 07-14-1961  [facility] CM/SW Contact:  Delilah Fend, LCSW Phone Number: 12/20/2023, 9:41 AM   Clinical Narrative:     Met with pt who confirms he has needed DME in the home.  OPPT already arranged with [facility].  No further TOC needs.  Final next level of care: OP Rehab Barriers to Discharge: No Barriers Identified   Patient Goals and CMS Choice Patient states their goals for this hospitalization and ongoing recovery are:: return home          Discharge Placement                       Discharge Plan and Services Additional resources added to the After Visit Summary for                  DME Arranged: N/A DME Agency: NA                  Social Drivers of Health (SDOH) Interventions SDOH Screenings   Food Insecurity: No Food Insecurity (12/19/2023)  Housing: Low Risk  (12/19/2023)  Transportation Needs: No Transportation Needs (12/19/2023)  Utilities: Not At Risk (12/19/2023)  Depression (PHQ2-9): Low Risk  (10/16/2023)  Tobacco Use: Low Risk  (12/19/2023)     Readmission Risk Interventions     No data to display

## 2023-12-20 NOTE — Progress Notes (Signed)
 Physical Therapy Treatment Patient Details Name: Raymond Willis MRN: [id number] DOB: [birth year] Today's Date: 12/20/2023   History of Present Illness 63 yo male s/p L TKA on 12/19/23. PMH: DM, HTN, gout. vertigo    PT Comments  POD # 1 am session Pt was OOB in recliner.  Slept there last night.  Assisted out of recliner to amb to stairs.  General transfer comment: cues for hand placement and LLE position with increased time  General Gait Details: cues for sequence, internal rotation LLE, knee extension as able in stance. upright posture. General stair comments: 50% VC's on proper sequencing, proper hand placement on ONE LEFT rail and to "lock" his L knee in extension..  Then returned to room to perform some TE's following HEP handout.  Instructed on proper tech, freq as well as use of ICE.    Will see Pt again later today with Spouse for [facility].    If plan is discharge home, recommend the following: A little help with bathing/dressing/bathroom;Help with stairs or ramp for entrance;Assistance with cooking/housework   Can travel by private vehicle        Equipment Recommendations  None recommended by PT    Recommendations for Other Services       Precautions / Restrictions Precautions Precautions: Fall;Knee Precaution/Restrictions Comments: no pillow under knee Restrictions Weight Bearing Restrictions Per Provider Order: No Other Position/Activity Restrictions: WBAT     Mobility  Bed Mobility               General bed mobility comments: OOB in recliner.  Slept in recliner last night.    Transfers Overall transfer level: Needs assistance Equipment used: Rolling walker (2 wheels) Transfers: Sit to/from Stand Sit to Stand: Supervision, Contact guard assist           General transfer comment: cues for hand placement and LLE position with increased time    Ambulation/Gait Ambulation/Gait assistance: Contact guard assist, Min assist Gait Distance (Feet):  22 Feet Assistive device: Rolling walker (2 wheels) Gait Pattern/deviations: Step-to pattern, Decreased weight shift to left Gait velocity: decreased     General Gait Details: cues for sequence, internal rotation LLE, knee extension as able in stance. upright posture.   Stairs Stairs: Yes Stairs assistance: Supervision, Contact guard assist Stair Management: One rail Left, Step to pattern, Forwards Number of Stairs: 2 General stair comments: 50% VC's on proper sequencing, proper hand placement on ONE LEFT rail and to "lock" his L knee in extension.   Wheelchair Mobility     Tilt Bed    Modified Rankin (Stroke Patients Only)       Balance                                            Communication Communication Communication: No apparent difficulties  Cognition Arousal: Alert Behavior During Therapy: WFL for tasks assessed/performed   PT - Cognitive impairments: No apparent impairments                       PT - Cognition Comments: AxO x 3 pleasant and motivated.  Lots of questions as this is Pt's first replaceemnt. Following commands: Intact      Cueing Cueing Techniques: Verbal cues  Exercises  Total Knee Replacement TE's following HEP handout 10 reps B LE ankle pumps 05 reps towel squeezes 05 reps knee presses  05 reps heel slides  05 reps SAQ's 05 reps SLR's 05 reps ABD Educated on use of gait belt to assist with TE's Followed by ICE     General Comments        Pertinent Vitals/Pain Pain Assessment Pain Assessment: 0-10 Pain Score: 5  Pain Location: right knee Pain Descriptors / Indicators: Aching, Discomfort, Sore, Operative site guarding Pain Intervention(s): Monitored during session, Premedicated before session, Repositioned, Ice applied    Home Living                          Prior Function            PT Goals (current goals can now be found in the care plan section) Progress towards PT goals:  Progressing toward goals    Frequency    7X/week      PT Plan      Co-evaluation              AM-PAC PT "6 Clicks" Mobility   Outcome Measure  Help needed turning from your back to your side while in a flat bed without using bedrails?: A Little Help needed moving from lying on your back to sitting on the side of a flat bed without using bedrails?: A Little Help needed moving to and from a bed to a chair (including a wheelchair)?: A Little Help needed standing up from a chair using your arms (e.g., wheelchair or bedside chair)?: A Little Help needed to walk in hospital room?: A Little Help needed climbing 3-5 steps with a railing? : A Little 6 Click Score: 18    End of Session Equipment Utilized During Treatment: Gait belt Activity Tolerance: Patient tolerated treatment well Patient left: in chair;with call bell/phone within reach;with chair alarm set;with family/visitor present Nurse Communication: Mobility status PT Visit Diagnosis: Other abnormalities of gait and mobility (R26.89)     Time: 1610-9604 PT Time Calculation (min) (ACUTE ONLY): 34 min  Charges:    $Gait Training: 8-22 mins $Therapeutic Exercise: 8-22 mins PT General Charges $$ ACUTE PT VISIT: 1 Visit                     Bess Broody  PTA Acute  Rehabilitation Services Office M-F          [phone]

## 2023-12-23 NOTE — Op Note (Signed)
 PATIENT ID:      Raymond Willis  MRN:     [id number] DOB/AGE:    63-Apr-1962 / 63 y.o.       OPERATIVE REPORT   DATE OF PROCEDURE:  12/19/2023      PREOPERATIVE DIAGNOSIS:   Left knee degenerative joint disease      Estimated body mass index is 32.84 kg/m as calculated from the following:   Height as of this encounter: 5\' 8"  (1.727 m).   Weight as of this encounter: 98 kg.                                                       POSTOPERATIVE DIAGNOSIS:   Same                                                                  PROCEDURE:  Procedure(s): ARTHROPLASTY, KNEE, TOTAL Using DepuyAttune RP implants #7 Femur, #8Tibia, 8 mm Attune RP bearing, 41 Patella    SURGEON: Boston Byers  ASSISTANT:   Luetta Sago PA-C   (Present and scrubbed throughout the case, critical for assistance with exposure, retraction, instrumentation, and closure.)        ANESTHESIA: spinal, 20cc Exparel , 50cc 0.25% Marcaine  EBL: min cc FLUID REPLACEMENT: unk cc crystaloid TOURNIQUET: none DRAINS: None TRANEXAMIC ACID : 1gm IV, 2gm topical COMPLICATIONS:  None         INDICATIONS FOR PROCEDURE: The patient has  Left knee degenerative joint disease, varus deformities, XR shows bone on bone arthritis, lateral subluxation of tibia. Patient has failed all conservative measures including anti-inflammatory medicines, narcotics, attempts at exercise and weight loss, cortisone injections and viscosupplementation.  Risks and benefits of surgery have been discussed, questions answered.   DESCRIPTION OF PROCEDURE: The patient identified by armband, received  IV antibiotics, in the holding area at [facility]. Patient taken to the operating room, appropriate anesthetic monitors were attached, and spinal anesthesia was  induced. IV Tranexamic acid  was given. Lateral post and 2 surefoot positioners applied to the table, the lower extremity was then prepped and draped in usual sterile fashion from the toes to the high  thigh. Time-out procedure was performed. Luetta Sago PAC, was present and scrubbed throughout the case, critical for assistance with, positioning, exposure, retraction, instrumentation, and closure.The skin and subcutaneous tissue along the incision was injected with 20 cc of a mixture of 20cc Exparel  and 30cc Marcaine  50cc saline solution, using a 21-gauge by 1-1/2 inch needle. We began the operation, with the knee flexed 130 degrees, by making the anterior midline incision starting at handbreadth above the patella going over the patella 1 cm medial to and 4 cm distal to the tibial tubercle. Small bleeders in the skin and the subcutaneous tissue identified and cauterized. Transverse retinaculum was incised and reflected medially and a medial parapatellar arthrotomy was accomplished. the patella was everted and theprepatellar fat pad resected. The superficial medial collateral ligament was then elevated from anterior to posterior along the proximal flare of the tibia and anterior half of the menisci resected. The knee was hyperflexed exposing bone on  bone arthritis. Peripheral and notch osteophytes as well as the cruciate ligaments were then resected. We continued to work our way around posteriorly along the proximal tibia, and externally rotated the tibia subluxing it out from underneath the femur. A McHale PCL retractor was placed through the notch, a lateral Hohmann retractor, and anterolateral small homan retractor placed. We then entered the proximal tibia with the Depuy starter drill in line with the axis of the tibia followed by an intramedullary guide rod and 3 degree posterior slope cutting guide. The tibial cutting guide, was pinned into place allowing resection of 2 mm of bone medially and 10 mm of bone laterally. Satisfied with the tibial resection, we then entered the distal femur 2 mm anterior to the PCL origin with the starter drill, followed by the intramedullary guide rod and applied the distal  femoral cutting guide set at 9 mm, with 5 degrees of valgus. This was pinned along the epicondylar axis. At this point, the distal femoral cut was accomplished without difficulty. We then sized for a #7 femoral component and pinned the chamfer guide in 3 degrees of external rotation. The anterior, posterior, and chamfer cuts were accomplished without difficulty followed by the Attune RP box cutting guide and the box cut. We also removed posterior osteophytes from the posterior femoral condyles. The posterior capsule was injected with Exparel  solution. The knee was brought into full extension. We checked our extension gap and fit a 8 mm trial lollipop. Distracting in extension with a lamina spreader,  bleeders in the posterior capsule, Posterior medial and posterior lateral gutter were cauterized.  The transexamic acid-soaked sponge was then placed in the gap of the knee in extension. The knee was flexed 30. The posterior patella cut was accomplished with the 9.5 mm Attune cutting guide, sized for a 41 mm dome, and the fixation pegs drilled.The knee was then once again hyperflexed exposing the proximal tibia. We sized for a # 8 tibial base plate, applied the smokestack and the conical reamer followed by the the Delta fin keel punch. We then hammered into place the Attune RP trial femoral component, drilled the lugs, inserted a  8 mm trial bearing, trial patellar button, and took the knee through range of motion from 0-130 degrees. Medial and lateral ligamentous stability was checked. No thumb pressure was required for patellar Tracking.  All trial components were removed, mating surfaces irrigated with pulse lavage, and dried with suction and sponges. Exparel  solution was applied to the cancellus bone of the patella distal femur and proximal tibia.  After waiting 30 seconds, the bony surfaces were again, dried with sponges. A double batch of DePuy HV cement was mixed and applied to all bony metallic mating surfaces  except for the posterior condyles of the femur itself. In order, we hammered into place the tibial tray and removed excess cement, the femoral component and removed excess cement. The final Attune RP bearing was inserted, and the knee brought to full extension with compression. The patellar button was clamped into place, and excess cement removed. The knee was held at 30 flexion with compression using the second surefoot, while the cement cured. The wound was irrigated out with normal saline solution pulse lavage. The rest of the Exparel  was injected into the parapatellar arthrotomy, subcutaneous tissues, and periosteal tissues. The parapatellar arthrotomy was closed with running #1 Vicryl suture. The subcutaneous tissue with 3-0 undyed Vicryl suture, and the skin with running 3-0 SQ vicryl. An Aquacil dressing and Ace wrap  were applied. The patient was taken to recovery room without difficulty.   Boston Byers 12/23/2023, 7:50 AM

## 2023-12-24 ENCOUNTER — Other Ambulatory Visit (HOSPITAL_COMMUNITY): Payer: Self-pay | Admitting: Orthopedic Surgery

## 2023-12-24 ENCOUNTER — Ambulatory Visit (HOSPITAL_COMMUNITY)
Admission: RE | Admit: 2023-12-24 | Discharge: 2023-12-24 | Disposition: A | Discharge status: Home / Self Care | Source: Ambulatory Visit | Attending: Orthopedic Surgery | Admitting: Orthopedic Surgery

## 2023-12-24 DIAGNOSIS — M79605 Pain in left leg: Secondary | ICD-10-CM | POA: Insufficient documentation

## 2023-12-24 DIAGNOSIS — M7989 Other specified soft tissue disorders: Secondary | ICD-10-CM | POA: Diagnosis present

## 2023-12-24 NOTE — Progress Notes (Signed)
 VASCULAR LAB    Left lower extremity venous duplex has been performed.  See CV proc for preliminary results.  Called negative results to office who stated they would send a message to Dr. Milbert Alice, Caleen Taaffe, RVT 12/24/2023, 2:09 PM

## 2024-01-01 ENCOUNTER — Ambulatory Visit: Payer: Managed Care, Other (non HMO) | Admitting: Podiatry

## 2024-01-14 ENCOUNTER — Ambulatory Visit: Payer: Managed Care, Other (non HMO) | Admitting: Family Medicine

## 2024-01-14 ENCOUNTER — Encounter: Payer: Self-pay | Admitting: Family Medicine

## 2024-01-14 VITALS — BP 124/72 | HR 71 | Temp 98.5°F | Ht 68.0 in | Wt 216.0 lb

## 2024-01-14 DIAGNOSIS — M25569 Pain in unspecified knee: Secondary | ICD-10-CM

## 2024-01-14 DIAGNOSIS — E119 Type 2 diabetes mellitus without complications: Secondary | ICD-10-CM

## 2024-01-14 LAB — POCT GLYCOSYLATED HEMOGLOBIN (HGB A1C): Hemoglobin A1C: 5.8 % — AB (ref 4.0–5.6)

## 2024-01-14 MED ORDER — CELECOXIB 200 MG PO CAPS: 200.0000 mg | ORAL_CAPSULE | Freq: Every day | ORAL | 1 refills | Status: DC

## 2024-01-14 MED ORDER — TIZANIDINE HCL 2 MG PO TABS: 2.0000 mg | ORAL_TABLET | Freq: Two times a day (BID) | ORAL | 1 refills | Status: DC | PRN

## 2024-01-14 NOTE — Patient Instructions (Addendum)
Recheck in about 3 months.   A1c at the visit.  Take care.  Glad to see you.

## 2024-01-14 NOTE — Progress Notes (Unsigned)
 Diabetes:  No meds.  Hypoglycemic episodes: no sx Hyperglycemic episodes: no sx Feet problems: some L foot tingling in the AM, that is positional and then resolves.  That started after surgery.   Blood Sugars averaging: not checked.   A1c improved.  D/w pt. some of his A1c improvement may be due to improved sugar control but some could be due to expected perioperative blood loss.  L knee surgery.  He has PT today.  He may end up needing a refill on tizanidine and celebrex .  Using rolling walker and compression stocking.   PMH and SH reviewed  Meds, vitals, and allergies reviewed.   ROS: Per HPI unless specifically indicated in ROS section   GEN: nad, alert and oriented HEENT: mucous membranes moist NECK: supple w/o LA CV: rrr. PULM: ctab, no inc wob ABD: soft, +bs EXT: no edema SKIN: no acute rash  L knee scar healing 1 + LLE edema.  No RLE edema.

## 2024-01-16 NOTE — Assessment & Plan Note (Signed)
 He is going to continue with PT.  Prescription sent for tizanidine and Celebrex .  Continue with compression stocking and using his rolling walker.  He will update me as needed.  He is making progress.

## 2024-01-16 NOTE — Assessment & Plan Note (Signed)
 No change in meds.  A1c improved.  See above. Recheck in about 3 months.  A1c at the visit.

## 2024-03-18 ENCOUNTER — Ambulatory Visit: Admitting: Podiatry

## 2024-04-14 ENCOUNTER — Encounter: Payer: Self-pay | Admitting: Family Medicine

## 2024-04-14 ENCOUNTER — Ambulatory Visit: Admitting: Family Medicine

## 2024-04-14 VITALS — BP 128/76 | HR 63 | Temp 98.3°F | Ht 68.0 in | Wt 223.0 lb

## 2024-04-14 DIAGNOSIS — Z8639 Personal history of other endocrine, nutritional and metabolic disease: Secondary | ICD-10-CM

## 2024-04-14 DIAGNOSIS — E119 Type 2 diabetes mellitus without complications: Secondary | ICD-10-CM | POA: Diagnosis not present

## 2024-04-14 DIAGNOSIS — M65319 Trigger thumb, unspecified thumb: Secondary | ICD-10-CM | POA: Diagnosis not present

## 2024-04-14 DIAGNOSIS — M25569 Pain in unspecified knee: Secondary | ICD-10-CM | POA: Diagnosis not present

## 2024-04-14 LAB — POCT GLYCOSYLATED HEMOGLOBIN (HGB A1C): Hemoglobin A1C: 6 % — AB (ref 4.0–5.6)

## 2024-04-14 NOTE — Progress Notes (Signed)
 H/o diabetes, not diabetic at this point.  No meds.   Hypoglycemic episodes: no Hyperglycemic episodes:no Feet problems: no Blood Sugars averaging: not checked.   A1c 6.  Stable.  D/w pt.   D/w pt about limiting carbs.    Occ pulsing sensation in the L arm.  No CP.  Sx are intermittent and better with moving his arm.  No clear trigger.  No exertional.  It could be a muscle twitch in the biceps, d/w pt. I asked him to update me if he had continued symptoms.  He has some triggering in the L thumb IP.  Not painful.    L knee is clearly better. Still with some swelling in L knee.   He is dealing with more R knee pain now.  He is considering options about his R knee.    No h/o CAD and d/w pt about stopping aspirin .    He got his [employer] and is working on the full restoration.    Meds, vitals, and allergies reviewed.   ROS: Per HPI unless specifically indicated in ROS section   GEN: nad, alert and oriented HEENT: ncat NECK: supple w/o LA CV: rrr. PULM: ctab, no inc wob ABD: soft, +bs EXT: no edema SKIN: no acute rash L knee with healed incision, slightly puffy but not red or hot.

## 2024-04-14 NOTE — Patient Instructions (Addendum)
 If you have more thumb or arm symptoms, then let me know.  In particular about your thumb- if more troubles then ask about seeing Dr. Watt. Yearly visit around Feb 2026.  Labs ahead of time if possible.  Take care.  Glad to see you.

## 2024-04-16 DIAGNOSIS — M65319 Trigger thumb, unspecified thumb: Secondary | ICD-10-CM | POA: Insufficient documentation

## 2024-04-16 NOTE — Assessment & Plan Note (Addendum)
 H/o diabetes, not diabetic at this point.  A1c 6.  Stable.  D/w pt.   D/w pt about limiting carbs.   Recheck periodically.

## 2024-04-16 NOTE — Assessment & Plan Note (Signed)
 Left knee appears to be healing normally. He is considering options about his R knee, ie follow-up with Ortho.

## 2024-04-16 NOTE — Assessment & Plan Note (Signed)
 Discussed with patient about options, if more troubles then he can ask about seeing Dr. Watt.

## 2024-09-18 ENCOUNTER — Ambulatory Visit: Admitting: Family Medicine

## 2024-10-06 ENCOUNTER — Other Ambulatory Visit: Payer: Self-pay | Admitting: Family Medicine

## 2024-10-06 DIAGNOSIS — Z125 Encounter for screening for malignant neoplasm of prostate: Secondary | ICD-10-CM

## 2024-10-06 DIAGNOSIS — M1000 Idiopathic gout, unspecified site: Secondary | ICD-10-CM

## 2024-10-06 DIAGNOSIS — I10 Essential (primary) hypertension: Secondary | ICD-10-CM

## 2024-10-06 DIAGNOSIS — R739 Hyperglycemia, unspecified: Secondary | ICD-10-CM

## 2024-10-13 ENCOUNTER — Other Ambulatory Visit

## 2024-10-20 ENCOUNTER — Encounter: Admitting: Family Medicine
